# Patient Record
Sex: Female | Born: 1945 | Race: White | Hispanic: Refuse to answer | Marital: Married | Smoking: Former smoker
Health system: Southern US, Community
[De-identification: ages and names within clinical notes are randomized; demographics above are authoritative.]

## PROBLEM LIST (undated history)

## (undated) DIAGNOSIS — N390 Urinary tract infection, site not specified: Secondary | ICD-10-CM

## (undated) DIAGNOSIS — E785 Hyperlipidemia, unspecified: Secondary | ICD-10-CM

## (undated) DIAGNOSIS — J189 Pneumonia, unspecified organism: Secondary | ICD-10-CM

## (undated) DIAGNOSIS — I10 Essential (primary) hypertension: Secondary | ICD-10-CM

## (undated) DIAGNOSIS — G43109 Migraine with aura, not intractable, without status migrainosus: Secondary | ICD-10-CM

## (undated) DIAGNOSIS — D689 Coagulation defect, unspecified: Secondary | ICD-10-CM

## (undated) DIAGNOSIS — I82409 Acute embolism and thrombosis of unspecified deep veins of unspecified lower extremity: Secondary | ICD-10-CM

## (undated) DIAGNOSIS — K219 Gastro-esophageal reflux disease without esophagitis: Secondary | ICD-10-CM

## (undated) HISTORY — DX: Pneumonia, unspecified organism: J18.9

## (undated) HISTORY — DX: Hyperlipidemia, unspecified: E78.5

## (undated) HISTORY — DX: Acute embolism and thrombosis of unspecified deep veins of unspecified lower extremity: I82.409

## (undated) HISTORY — DX: Essential (primary) hypertension: I10

## (undated) HISTORY — PX: WISDOM TOOTH EXTRACTION: SHX21

## (undated) HISTORY — DX: Migraine with aura, not intractable, without status migrainosus: G43.109

## (undated) HISTORY — PX: TONSILLECTOMY: SUR1361

## (undated) HISTORY — DX: Coagulation defect, unspecified: D68.9

## (undated) HISTORY — PX: OTHER SURGICAL HISTORY: SHX169

## (undated) HISTORY — DX: Gastro-esophageal reflux disease without esophagitis: K21.9

## (undated) HISTORY — PX: TUBAL LIGATION: SHX77

## (undated) HISTORY — DX: Urinary tract infection, site not specified: N39.0

---

## 1998-09-21 ENCOUNTER — Other Ambulatory Visit: Admission: RE | Admit: 1998-09-21 | Discharge: 1998-09-21 | Payer: Self-pay | Admitting: Obstetrics and Gynecology

## 1998-10-26 ENCOUNTER — Ambulatory Visit (HOSPITAL_COMMUNITY): Admission: RE | Admit: 1998-10-26 | Discharge: 1998-10-26 | Payer: Self-pay | Admitting: Obstetrics and Gynecology

## 1999-03-20 ENCOUNTER — Other Ambulatory Visit: Admission: RE | Admit: 1999-03-20 | Discharge: 1999-03-20 | Payer: Self-pay | Admitting: Obstetrics and Gynecology

## 1999-11-02 ENCOUNTER — Ambulatory Visit (HOSPITAL_COMMUNITY): Admission: RE | Admit: 1999-11-02 | Discharge: 1999-11-02 | Payer: Self-pay | Admitting: Interventional Cardiology

## 1999-12-26 ENCOUNTER — Ambulatory Visit (HOSPITAL_COMMUNITY): Admission: RE | Admit: 1999-12-26 | Discharge: 1999-12-26 | Payer: Self-pay | Admitting: Obstetrics and Gynecology

## 1999-12-26 ENCOUNTER — Encounter: Payer: Self-pay | Admitting: Obstetrics and Gynecology

## 2000-03-19 ENCOUNTER — Other Ambulatory Visit: Admission: RE | Admit: 2000-03-19 | Discharge: 2000-03-19 | Payer: Self-pay | Admitting: Obstetrics and Gynecology

## 2000-12-29 ENCOUNTER — Ambulatory Visit (HOSPITAL_COMMUNITY): Admission: RE | Admit: 2000-12-29 | Discharge: 2000-12-29 | Payer: Self-pay | Admitting: Obstetrics and Gynecology

## 2000-12-29 ENCOUNTER — Encounter: Payer: Self-pay | Admitting: Obstetrics and Gynecology

## 2001-02-23 ENCOUNTER — Other Ambulatory Visit: Admission: RE | Admit: 2001-02-23 | Discharge: 2001-02-23 | Payer: Self-pay | Admitting: Obstetrics and Gynecology

## 2001-02-26 ENCOUNTER — Encounter: Payer: Self-pay | Admitting: Obstetrics and Gynecology

## 2001-02-26 ENCOUNTER — Encounter: Admission: RE | Admit: 2001-02-26 | Discharge: 2001-02-26 | Payer: Self-pay | Admitting: Obstetrics and Gynecology

## 2001-10-14 HISTORY — PX: ENDOVENOUS ABLATION SAPHENOUS VEIN W/ LASER: SUR449

## 2002-04-30 ENCOUNTER — Encounter: Payer: Self-pay | Admitting: Obstetrics and Gynecology

## 2002-04-30 ENCOUNTER — Ambulatory Visit (HOSPITAL_COMMUNITY): Admission: RE | Admit: 2002-04-30 | Discharge: 2002-04-30 | Payer: Self-pay | Admitting: Obstetrics and Gynecology

## 2004-07-03 ENCOUNTER — Ambulatory Visit (HOSPITAL_COMMUNITY): Admission: RE | Admit: 2004-07-03 | Discharge: 2004-07-03 | Payer: Self-pay | Admitting: Ophthalmology

## 2004-09-12 ENCOUNTER — Other Ambulatory Visit: Admission: RE | Admit: 2004-09-12 | Discharge: 2004-09-12 | Payer: Self-pay | Admitting: Obstetrics and Gynecology

## 2004-09-17 ENCOUNTER — Ambulatory Visit (HOSPITAL_COMMUNITY): Admission: RE | Admit: 2004-09-17 | Discharge: 2004-09-17 | Payer: Self-pay | Admitting: Obstetrics and Gynecology

## 2004-10-19 ENCOUNTER — Ambulatory Visit (HOSPITAL_COMMUNITY): Admission: RE | Admit: 2004-10-19 | Discharge: 2004-10-19 | Payer: Self-pay | Admitting: Obstetrics and Gynecology

## 2004-11-22 ENCOUNTER — Encounter (INDEPENDENT_AMBULATORY_CARE_PROVIDER_SITE_OTHER): Payer: Self-pay | Admitting: *Deleted

## 2004-11-22 ENCOUNTER — Ambulatory Visit (HOSPITAL_COMMUNITY): Admission: RE | Admit: 2004-11-22 | Discharge: 2004-11-22 | Payer: Self-pay | Admitting: Obstetrics and Gynecology

## 2005-09-13 ENCOUNTER — Ambulatory Visit: Payer: Self-pay | Admitting: Internal Medicine

## 2006-12-29 ENCOUNTER — Emergency Department (HOSPITAL_COMMUNITY): Admission: EM | Admit: 2006-12-29 | Discharge: 2006-12-29 | Payer: Self-pay | Admitting: Family Medicine

## 2007-08-08 ENCOUNTER — Emergency Department (HOSPITAL_COMMUNITY): Admission: EM | Admit: 2007-08-08 | Discharge: 2007-08-08 | Payer: Self-pay | Admitting: Family Medicine

## 2007-09-21 ENCOUNTER — Emergency Department (HOSPITAL_COMMUNITY): Admission: EM | Admit: 2007-09-21 | Discharge: 2007-09-21 | Payer: Self-pay | Admitting: Family Medicine

## 2007-10-20 ENCOUNTER — Encounter: Payer: Self-pay | Admitting: Internal Medicine

## 2008-04-13 ENCOUNTER — Emergency Department (HOSPITAL_COMMUNITY): Admission: EM | Admit: 2008-04-13 | Discharge: 2008-04-13 | Payer: Self-pay | Admitting: Family Medicine

## 2008-04-27 ENCOUNTER — Emergency Department (HOSPITAL_COMMUNITY): Admission: EM | Admit: 2008-04-27 | Discharge: 2008-04-27 | Payer: Self-pay | Admitting: Emergency Medicine

## 2008-06-29 ENCOUNTER — Ambulatory Visit: Payer: Self-pay | Admitting: Family Medicine

## 2008-06-29 ENCOUNTER — Encounter (INDEPENDENT_AMBULATORY_CARE_PROVIDER_SITE_OTHER): Payer: Self-pay | Admitting: Emergency Medicine

## 2008-06-29 ENCOUNTER — Ambulatory Visit: Payer: Self-pay | Admitting: Vascular Surgery

## 2008-06-29 ENCOUNTER — Telehealth: Payer: Self-pay | Admitting: Internal Medicine

## 2008-06-29 ENCOUNTER — Emergency Department (HOSPITAL_COMMUNITY): Admission: EM | Admit: 2008-06-29 | Discharge: 2008-06-29 | Payer: Self-pay | Admitting: Emergency Medicine

## 2008-06-29 DIAGNOSIS — M79609 Pain in unspecified limb: Secondary | ICD-10-CM

## 2008-07-06 ENCOUNTER — Encounter: Payer: Self-pay | Admitting: Internal Medicine

## 2008-07-08 ENCOUNTER — Encounter (INDEPENDENT_AMBULATORY_CARE_PROVIDER_SITE_OTHER): Payer: Self-pay | Admitting: *Deleted

## 2008-07-19 ENCOUNTER — Ambulatory Visit: Payer: Self-pay | Admitting: Internal Medicine

## 2008-07-19 DIAGNOSIS — I10 Essential (primary) hypertension: Secondary | ICD-10-CM

## 2008-07-19 DIAGNOSIS — E785 Hyperlipidemia, unspecified: Secondary | ICD-10-CM

## 2008-07-19 DIAGNOSIS — Z86718 Personal history of other venous thrombosis and embolism: Secondary | ICD-10-CM

## 2008-07-21 ENCOUNTER — Ambulatory Visit: Payer: Self-pay | Admitting: Internal Medicine

## 2008-07-25 ENCOUNTER — Encounter (INDEPENDENT_AMBULATORY_CARE_PROVIDER_SITE_OTHER): Payer: Self-pay | Admitting: *Deleted

## 2008-07-26 ENCOUNTER — Encounter: Payer: Self-pay | Admitting: Internal Medicine

## 2008-07-28 ENCOUNTER — Ambulatory Visit: Payer: Self-pay | Admitting: Internal Medicine

## 2008-08-18 ENCOUNTER — Ambulatory Visit: Payer: Self-pay | Admitting: Internal Medicine

## 2008-08-18 ENCOUNTER — Telehealth (INDEPENDENT_AMBULATORY_CARE_PROVIDER_SITE_OTHER): Payer: Self-pay | Admitting: *Deleted

## 2008-08-18 DIAGNOSIS — F411 Generalized anxiety disorder: Secondary | ICD-10-CM | POA: Insufficient documentation

## 2008-11-17 ENCOUNTER — Ambulatory Visit: Payer: Self-pay | Admitting: Internal Medicine

## 2008-11-17 LAB — CONVERTED CEMR LAB
ALT: 27 units/L (ref 0–35)
AST: 23 units/L (ref 0–37)
Albumin: 4 g/dL (ref 3.5–5.2)
Alkaline Phosphatase: 48 units/L (ref 39–117)
HDL: 55.9 mg/dL (ref >39.0)
Total CHOL/HDL Ratio: 3.5
Triglycerides: 71 mg/dL (ref 0–149)

## 2008-11-24 ENCOUNTER — Ambulatory Visit: Payer: Self-pay | Admitting: Internal Medicine

## 2009-07-15 ENCOUNTER — Emergency Department (HOSPITAL_COMMUNITY): Admission: EM | Admit: 2009-07-15 | Discharge: 2009-07-15 | Payer: Self-pay | Admitting: Family Medicine

## 2009-12-09 ENCOUNTER — Ambulatory Visit (HOSPITAL_COMMUNITY): Admission: RE | Admit: 2009-12-09 | Discharge: 2009-12-09 | Payer: Self-pay | Admitting: Surgery

## 2009-12-09 ENCOUNTER — Ambulatory Visit: Payer: Self-pay | Admitting: Surgery

## 2010-01-04 ENCOUNTER — Telehealth (INDEPENDENT_AMBULATORY_CARE_PROVIDER_SITE_OTHER): Payer: Self-pay | Admitting: *Deleted

## 2010-01-11 ENCOUNTER — Ambulatory Visit: Payer: Self-pay | Admitting: Internal Medicine

## 2010-01-11 LAB — CONVERTED CEMR LAB
Alkaline Phosphatase: 46 units/L (ref 39–117)
BUN: 21 mg/dL (ref 6–23)
Bilirubin, Direct: 0 mg/dL (ref 0.0–0.3)
CO2: 30 meq/L (ref 19–32)
Cholesterol: 199 mg/dL (ref 0–200)
GFR calc non Af Amer: 67.27 mL/min (ref >60)
Glucose, Bld: 87 mg/dL (ref 70–99)
LDL Cholesterol: 117 mg/dL — ABNORMAL HIGH (ref 0–99)
Potassium: 4 meq/L (ref 3.5–5.1)
Sodium: 142 meq/L (ref 135–145)
Total Bilirubin: 0.6 mg/dL (ref 0.3–1.2)
Total Protein: 6.8 g/dL (ref 6.0–8.3)
VLDL: 20.8 mg/dL (ref 0.0–40.0)

## 2010-01-16 ENCOUNTER — Ambulatory Visit: Payer: Self-pay | Admitting: Internal Medicine

## 2010-01-16 DIAGNOSIS — N959 Unspecified menopausal and perimenopausal disorder: Secondary | ICD-10-CM | POA: Insufficient documentation

## 2010-01-16 LAB — CONVERTED CEMR LAB: HDL goal, serum: 40 mg/dL

## 2010-01-22 ENCOUNTER — Encounter: Payer: Self-pay | Admitting: Internal Medicine

## 2010-09-13 ENCOUNTER — Encounter: Payer: Self-pay | Admitting: Internal Medicine

## 2010-11-11 LAB — CONVERTED CEMR LAB
Basophils Absolute: 0.1 10*3/uL (ref 0.0–0.1)
Basophils Relative: 1 % (ref 0.0–3.0)
Bilirubin, Direct: 0.1 mg/dL (ref 0.0–0.3)
Calcium: 9.5 mg/dL (ref 8.4–10.5)
Creatinine, Ser: 0.9 mg/dL (ref 0.4–1.2)
Eosinophils Absolute: 0.2 10*3/uL (ref 0.0–0.7)
Free T4: 1.2 ng/dL (ref 0.6–1.6)
GFR calc Af Amer: 82 mL/min
GFR calc non Af Amer: 68 mL/min
HCT: 43.1 % (ref 36.0–46.0)
Hemoglobin: 15.2 g/dL — ABNORMAL HIGH (ref 12.0–15.0)
Hgb A1c MFr Bld: 5.4 % (ref 4.6–6.0)
Lymphocytes Relative: 26.5 % (ref 12.0–46.0)
MCHC: 35.2 g/dL (ref 30.0–36.0)
MCV: 89.9 fL (ref 78.0–100.0)
Monocytes Absolute: 0.4 10*3/uL (ref 0.1–1.0)
Neutro Abs: 3.5 10*3/uL (ref 1.4–7.7)
RDW: 11.7 % (ref 11.5–14.6)
Sodium: 141 meq/L (ref 135–145)
TSH: 2.23 microintl units/mL (ref 0.35–5.50)
Total Bilirubin: 1.1 mg/dL (ref 0.3–1.2)

## 2010-11-15 NOTE — Letter (Signed)
Summary: Consuela Mimes MD  Consuela Mimes MD   Imported By: Lanelle Bal 10/16/2010 12:41:50  _____________________________________________________________________  External Attachment:    Type:   Image     Comment:   External Document

## 2010-11-15 NOTE — Letter (Signed)
Summary: Primary Care Appointment Letter  [facility] [place]  [address] [place], [place] [address]   Phone: [phone]  Fax: [phone]    07/08/2008 MRN: [id number]  [facility] [address] [place], [place]  [address]  Dear Ms. [facility],   Your Primary Care Physician  has indicated that:    _______it is time to schedule an appointment.    _______you missed your appointment on______ and need to call and          reschedule.    _______you need to have lab work done.    _______you need to schedule an appointment discuss lab or test results.    _______we rescheduled your appt with Dr. Alwyn Ren from 07/11/08 @ 12:00 to Oct. 6, 2009 @ 11:15am    Please call our office if you have any questions at [phone].     Thank you,    [place] [facility] Scheduler

## 2010-11-15 NOTE — Progress Notes (Signed)
Summary: Refill Request  Phone Note Refill Request Message from:  [facility] [address] Fax #: [phone]  Refills Requested: Medication #1:  LORAZEPAM 0.5 MG TABS 1 q 8 hrs as needed stress.   Dosage confirmed as above?Dosage Confirmed   Supply Requested: 1 month   Last Refilled: 08/18/2008 Next Appointment Scheduled: none Initial call taken by: Harold Barban,  January 04, 2010 8:59 AM    New/Updated Medications: LORAZEPAM 0.5 MG TABS (LORAZEPAM) 1 q 8 hrs as needed stress, DUE FOR APPOINTMENT Prescriptions: LORAZEPAM 0.5 MG TABS (LORAZEPAM) 1 q 8 hrs as needed stress, DUE FOR APPOINTMENT  #30 x 0   Entered by:   Shonna Chock   Authorized by:   Marga Melnick MD   Signed by:   Shonna Chock on 01/04/2010   Method used:   Printed then faxed to ...       [facility]  [phone]* (retail)       [address] [place], [place]  [address]       Ph: [phone] or [phone]       Fax: [phone]   RxID:   [id number]

## 2010-11-15 NOTE — Assessment & Plan Note (Signed)
Summary: fu on labs/kdc   Vital Signs:  Patient profile:   65 year old female Weight:      187.2 pounds BMI:     33.55 Pulse rate:   72 / minute Resp:     12 per minute BP sitting:   124 / 84  (left arm) Cuff size:   large  Vitals Entered By: Shonna Chock (January 16, 2010 3:01 PM) CC: Follow-up visit: Discuss Labs (copy given), Lipid Management Comments REVIEWED MED LIST, PATIENT AGREED DOSE AND INSTRUCTION CORRECT    CC:  Follow-up visit: Discuss Labs (copy given) and Lipid Management.  History of Present Illness: Labs reviewed & risks discussed. LDL is @ goal of < 130.On Heart Healthy diet; CVE as walking 2-3 mpd 5X/week w/o symptoms.  Lipid Management History:      Positive NCEP/ATP III risk factors include female age 64 years old or older and hypertension.  Negative NCEP/ATP III risk factors include no history of early menopause without estrogen hormone replacement, non-diabetic, no family history for ischemic heart disease, non-tobacco-user status, no ASHD (atherosclerotic heart disease), no prior stroke/TIA, no peripheral vascular disease, and no history of aortic aneurysm.     Allergies: 1)  ! Darvocet  Past History:  Past Medical History: DVT, hx of 1998 Hyperlipidemia: NMR : LDL 409(8119/147), TG 108, HDL 60. LDL goal = < 130, ideally < 110. Hypertension Ocular Migraines  Review of Systems CV:  Denies chest pain or discomfort, leg cramps with exertion, palpitations, shortness of breath with exertion, swelling of feet, and swelling of hands.  Physical Exam  General:  well-nourished; alert,appropriate and cooperative throughout examination Lungs:  Normal respiratory effort, chest expands symmetrically. Lungs are clear to auscultation, no crackles or wheezes. Heart:  Normal rate and regular rhythm. S1 and S2 normal without gallop, murmur, click, rub .S4 Pulses:  R and L carotid,radial,dorsalis pedis and posterior tibial pulses are full and equal bilaterally Psych:   Oriented X3.  Focused & intelligent   Impression & Recommendations:  Problem # 1:  HYPERLIPIDEMIA (ICD-272.4) LDL  @ goal  Her updated medication list for this problem includes:    Crestor 20 Mg Tabs (Rosuvastatin calcium) .Marland Kitchen... 1 by mouth x 1 weekly  Problem # 2:  HYPERTENSION (ICD-401.9) Controlled Her updated medication list for this problem includes:    Bystolic 20 Mg Tabs (Nebivolol hcl) .Marland Kitchen... 1 by mouth once daily    Benicar Hct 40-25 Mg Tabs (Olmesartan medoxomil-hctz) .Marland Kitchen... 1 by mouth once daily  Complete Medication List: 1)  Bystolic 20 Mg Tabs (Nebivolol hcl) .Marland Kitchen.. 1 by mouth once daily 2)  Benicar Hct 40-25 Mg Tabs (Olmesartan medoxomil-hctz) .Marland Kitchen.. 1 by mouth once daily 3)  Vitamin D 2000 Unit Tabs (Cholecalciferol) .Marland Kitchen.. 1 by mouth once daily 4)  Centrum Silver Tabs (Multiple vitamins-minerals) .... Take 1 tab once daily 5)  Chromium Gtf 200 Mcg Tabs (Chromium) .... Take 1 tab once daily 6)  Fish Oil 1200 Mg Caps (Omega-3 fatty acids) .Marland Kitchen.. 1 by mouth once daily 7)  Bayer Aspirin 325 Mg Tabs (Aspirin) .Marland Kitchen.. 1 by mouth once daily 8)  Crestor 20 Mg Tabs (Rosuvastatin calcium) .Marland Kitchen.. 1 by mouth x 1 weekly 9)  Lorazepam 0.5 Mg Tabs (Lorazepam) .Marland Kitchen.. 1 q 8 hrs as needed stress, due for appointment 10)  Cyto-q Max 100 Mg/ml Liqd (Ubiquinol liposomal) .... Once daily 11)  Astaxantin  .... Once daily 12)  L-arginine 500 Mg Tabs (Arginine) .... Once daily  Other Orders: Radiology Referral (  Radiology)  Lipid Assessment/Plan:      Based on NCEP/ATP III, the patient's risk factor category is "2 or more risk factors and a calculated 10 year CAD risk of < 20%".  The patient's lipid goals are as follows: Total cholesterol goal is 200; LDL cholesterol goal is 130; HDL cholesterol goal is 40; Triglyceride goal is 150.  Her LDL cholesterol goal has been met.    Patient Instructions: 1)  Please schedule a follow-up appointment in 1 year or  as needed

## 2011-01-17 LAB — POCT URINALYSIS DIP (DEVICE)
Bilirubin Urine: NEGATIVE
Glucose, UA: NEGATIVE mg/dL
Hgb urine dipstick: NEGATIVE
Ketones, ur: NEGATIVE mg/dL
Specific Gravity, Urine: 1.015 (ref 1.005–1.030)

## 2011-03-01 NOTE — Op Note (Signed)
Deborah [name], Deborah [name]        ACCOUNT NO.:  [id number]   MEDICAL RECORD NO.:  [id number]          PATIENT TYPE:  AMB   LOCATION:  SDC                           [facility]ILITY:  [facility]   PHYSICIAN:  Hal Morales, M.D.DATE OF BIRTH:  1946/07/27   DATE OF PROCEDURE:  11/22/2004  DATE OF DISCHARGE:                                 OPERATIVE REPORT   PREOPERATIVE DIAGNOSIS:  Postmenopausal bleeding, question of intrauterine  synechiae on sonohysterogram.   POSTOPERATIVE DIAGNOSIS:  Postmenopausal bleeding, question of intrauterine  synechiae on sonohysterogram.   OPERATION:  Hysteroscopy and curettage.   SURGEON:  Hal Morales, M.D.   ANESTHESIA:  General LMA.   ESTIMATED BLOOD LOSS:  Less than 10 cc.   COMPLICATIONS:  None.   SPECIMENS TO PATHOLOGY:  Endocervical curettings and endometrial curettings.   PROCEDURE:  The patient was taken to the operating room after appropriate  identification and placed on the operating table. After the attainment of  adequate general anesthesia, she was placed in lithotomy position. The  perineum and vagina were prepped with multiple layers of Betadine and a red  Robinson catheter used to empty the bladder. The perineum was draped as a  sterile field. A Graves speculum was placed in the vagina and a single-tooth  tenaculum placed on the anterior cervix. The uterus sounded to 7 cm. The  cervix was then dilated to accommodate the diagnostic hysteroscope and the  hysteroscope was used to visualize the endometrial cavity, including both  ostia as well as the endocervical canal. No specific lesions were noted.  There was a small indentation at the fundus of the uterus, but no specific  synechiae that precluded expansion of the cavity. The hysteroscope was  removed and curettage of all quadrants of the uterus was taken out after an  endocervical specimen had been obtained. The hysteroscope was replaced with  documentation of an intact uterus and  no specific lesions. All instruments  were then removed from the vagina and the patient was awakened from general  anesthesia and taken to the recovery room in satisfactory condition having  tolerated the procedure well with sponge and instrument counts correct.      VPH/MEDQ  D:  11/22/2004  T:  11/22/2004  Job:  [id number]

## 2011-03-01 NOTE — H&P (Signed)
NAME:  Deborah [name], Deborah [name]        ACCOUNT NO.:  [id number]   MEDICAL RECORD NO.:  [id number]          PATIENT TYPE:  AMB   LOCATION:  SDC                           [facility]   PHYSICIAN:  Hal Morales, M.D.DATE OF BIRTH:  December 31, 1945   DATE OF ADMISSION:  DATE OF DISCHARGE:                                HISTORY & PHYSICAL   HISTORY OF PRESENT ILLNESS:  Ms. Olsson is a 65 year old married white  menopausal female, para 3-0-0-3 who presents for hysteroscopic resection of  synechiae.  In November 2005, the patient reported a 1 1/2 week of vaginal  spotting which was occasionally observed to be dark brown and mucoid.  There  was no associated pain with this episode and the patient has not had any  bleeding since that time.  She denies any vaginitis symptoms, fever or  urinary tract symptoms.  A pelvic ultrasound with a sonohysterogram in  January 2006 showed four small fibroids with the largest measuring 1.9 cm, a  thin linear structure in the left fundal region of the endometrial cavity  suspicious for synechia, however, no evidence of other endometrial pathology  or submucosal fibroids were seen.  A discussion was held with the patient  regarding proceeding with hysteroscopic resection of her synechia as well as  this being a means to further evaluate her endometrium. The patient has  consented to proceed.   OB HISTORY:  Gravida 3, para 3-0-0-3.   GYN HISTORY:  Menarche 65 years old.  The patient is post menopausal. She  uses tubal ligation as a method of contraception, denies any history of  sexually transmitted diseases. 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   PAST MEDICAL HISTORY:  Positive for hypertension, deep vein thrombosis  (secondary to oral contraceptives), endocervical polyps, right breast cyst  (benign), borderline diabetes, mild mitral regurgitation, and ocular  migraine.   PAST SURGICAL HISTORY:  1959 tonsillectomy, 1979 bilateral tubal ligation,  2001 bilateral bunionectomy.  Denies any history of blood transfusions or  problems with anesthesia.   FAMILY HISTORY:  Positive for diabetes mellitus, stroke, hypertension and  renal cancer.   SOCIAL HISTORY:  The patient is married. 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   HABITS:  She does not use tobacco, she drinks alcohol socially.   CURRENT MEDICATIONS:  1.  Toprol XL 100 mg daily.  2.  Vytoran 10/40, 1 tablet daily.  3.  Calcium 500 mg twice daily.  4.  Fish oil 1 tablet daily.  5.  Vitamin E, 400 IU units, 1 tablet daily.  6.  Multivitamin 1 tablet daily.   ALLERGIES:  The patient has no known drug allergies; however, she is  sensitive Darvocet which causes severe nausea and vomiting.   REVIEW OF SYMPTOMS:  Negative except as mentioned in history of present  illness.   PHYSICAL EXAMINATION:  VITAL SIGNS:  Blood pressure is 130/80, weight is  176, height is 5 feet 3 1/2 inches tall.  NECK:  Supple, there are  no masses, thyromegaly or cervical adenopathy.  HEART:  Regular rate and rhythm. There is no murmur.  LUNGS:  Clear to auscultation. There are no wheezes, rales or rhonchi.  BACK:  No CVA tenderness.  ABDOMEN:  Bowel sounds are present.  It is soft without tenderness,  guarding, rebound or organomegaly.  EXTREMITIES:  Without cyanosis, clubbing or edema.  PELVIC:  EGBUS is within normal limits. Vagina is mildly atrophic.  Cervix  is nontender without lesions.  Uterus appears normal size, shape and  consistency without tenderness.  Adnexa without tenderness or masses.  Rectovaginal without tenderness or masses.   IMPRESSION:  1.  Postmenopausal bleeding.  2.  Endometrial synechiae.   DISPOSITION:  A discussion was held with the patient regarding the  indications for her procedure along with its risks which include but are not  limited to  reaction to anesthesia, damage to adjacent organs, infection and  excessive bleeding. The patient has consented to proceed with hysteroscopic  resection of synechia at [facility] [place] on November 22, 2004  at 9 o'clock a.m.      EJP/MEDQ  D:  11/16/2004  T:  11/16/2004  Job:  [id number]

## 2011-03-30 ENCOUNTER — Encounter: Payer: Self-pay | Admitting: Internal Medicine

## 2011-04-02 ENCOUNTER — Ambulatory Visit (INDEPENDENT_AMBULATORY_CARE_PROVIDER_SITE_OTHER): Payer: 59 | Admitting: Internal Medicine

## 2011-04-02 ENCOUNTER — Encounter: Payer: Self-pay | Admitting: Internal Medicine

## 2011-04-02 DIAGNOSIS — I10 Essential (primary) hypertension: Secondary | ICD-10-CM

## 2011-04-02 DIAGNOSIS — Z0000 Encounter for general adult medical examination without abnormal findings: Secondary | ICD-10-CM

## 2011-04-02 DIAGNOSIS — Z23 Encounter for immunization: Secondary | ICD-10-CM

## 2011-04-02 DIAGNOSIS — E785 Hyperlipidemia, unspecified: Secondary | ICD-10-CM

## 2011-04-02 DIAGNOSIS — R635 Abnormal weight gain: Secondary | ICD-10-CM

## 2011-04-02 DIAGNOSIS — Z86718 Personal history of other venous thrombosis and embolism: Secondary | ICD-10-CM

## 2011-04-02 NOTE — Progress Notes (Signed)
Subjective:    Patient ID: Deborah [name], female    DOB: 28-May-1946, 65 y.o.   MRN: [id number]  HPI  Deborah [name] is here for a physical;acute issues include weight concerns.     Review of Systems Patient reports no significant  vision/ hearing  changes, adenopathy,fever,   persistant / recurrent hoarseness , swallowing issues, chest pain,palpitations,edema,persistant /recurrent cough, hemoptysis, dyspnea( rest/ exertional/paroxysmal nocturnal), gastrointestinal bleeding(melena, rectal bleeding), abdominal pain,  bowel changes,GU symptoms(dysuria, hematuria,pyuria, incontinence ), Gyn symptoms(abnormal  bleeding , pain),  syncope, focal weakness, memory loss,numbness & tingling, skin/hair /nail changes,abnormal bruising or bleeding, anxiety,or depression.  No colonoscopy to date; "I didn't get it done" [facility] reviewed). Exercising as walking 2-3 mpd. Weight up 15# with carbs. GERD associated cough; wine & spicey foods triggers  for GERD.     Objective:   Physical Exam Gen.: Healthy and well-nourished in appearance. Alert, appropriate and cooperative throughout exam. Head: Normocephalic without obvious abnormalities Eyes: No corneal or conjunctival inflammation noted. Pupils equal round reactive to light and accommodation. Fundal exam is benign without hemorrhages, exudate, papilledema. Extraocular motion intact. Vision grossly normal.Mininmal OS ptosis Ears: External  ear exam reveals no significant lesions or deformities. Canals clear .TMs normal. Hearing is grossly normal bilaterally. Nose: External nasal exam reveals no deformity or inflammation. Nasal mucosa are pink and moist. No lesions or exudates noted. Septum L dislocated  Mouth: Oral mucosa and oropharynx reveal no lesions or exudates. Teeth in good repair.Slight TMJ dislocation to L Neck: No deformities, masses, or tenderness noted. Range of motion &. Thyroid normal. Lungs: Normal respiratory effort; chest expands  symmetrically. Lungs are clear to auscultation without rales, wheezes, or increased work of breathing. Heart: Slow rate & regular rhythm. Normal S1 and S2. No gallop, click, or rub. No  murmur. Abdomen: Bowel sounds normal; abdomen soft and nontender. No masses, organomegaly or hernias noted. Genitalia: as per Dr  Pennie Rushing                                                                                     Musculoskeletal/extremities: No deformity or scoliosis noted of  the thoracic or lumbar spine. No clubbing, cyanosis, edema, or deformity noted. Range of motion  normal .Tone & strength  normal.Joints normal. Nail health  Good.Crepitus of knees Vascular: Carotid, radial artery, dorsalis pedis and dorsalis posterior tibial pulses are full and equal. No bruits present. Neurologic: Alert and oriented x3. Deep tendon reflexes symmetrical and normal.         Skin: Intact without suspicious lesions or rashes. Lymph: No cervical, axillary, or inguinal lymphadenopathy present. Psych: Mood and affect are normal. Normally interactive                                                                                         Assessment &  Plan:  #1 comprehensive physical exam; no acute findings #2 see Problem List with Assessments & Recommendations Plan: see Orders

## 2011-04-02 NOTE — Progress Notes (Signed)
Addended by: Doristine Devoid on: 04/02/2011 02:18 PM   Modules accepted: Orders

## 2011-04-02 NOTE — Patient Instructions (Addendum)
Preventive Health Care: Exercise  30-45  minutes a day, 3-4 days a week. Walking is especially valuable in preventing Osteoporosis. Eat a low-fat diet with lots of fruits and vegetables, up to 7-9 servings per day. Avoid obesity; your goal = waist less than 35 inches.Consume less than 30 grams of sugar per day from foods & drinks with High Fructose Corn Syrup as #2,3 or #4 on label. The triggers for dyspepsia or "heart burn"  include stress; the "aspirin family" ; alcohol; peppermint; and caffeine (coffee, tea, cola, and chocolate). The aspirin family would include aspirin and the nonsteroidal agents such as ibuprofen &  Naproxen. Tylenol would not cause reflux. If having dyspepsia ; food & dink should be avoided for @ least 2 hors before going to bed.  BMD every 25 months Please  schedule fasting Labs : BMET,Lipids, hepatic panel, CBC & dif,free T4,  & TSH.  A Coagulopathy  Profile is indicated clinically before any future  prolonged travel or surgery.

## 2011-04-03 ENCOUNTER — Other Ambulatory Visit: Payer: Self-pay | Admitting: Internal Medicine

## 2011-04-03 DIAGNOSIS — I10 Essential (primary) hypertension: Secondary | ICD-10-CM

## 2011-04-03 DIAGNOSIS — Z86718 Personal history of other venous thrombosis and embolism: Secondary | ICD-10-CM

## 2011-04-03 DIAGNOSIS — Z0000 Encounter for general adult medical examination without abnormal findings: Secondary | ICD-10-CM

## 2011-04-03 DIAGNOSIS — R635 Abnormal weight gain: Secondary | ICD-10-CM

## 2011-04-03 DIAGNOSIS — E785 Hyperlipidemia, unspecified: Secondary | ICD-10-CM

## 2011-04-04 ENCOUNTER — Other Ambulatory Visit (INDEPENDENT_AMBULATORY_CARE_PROVIDER_SITE_OTHER): Payer: 59

## 2011-04-04 DIAGNOSIS — I10 Essential (primary) hypertension: Secondary | ICD-10-CM

## 2011-04-04 DIAGNOSIS — E785 Hyperlipidemia, unspecified: Secondary | ICD-10-CM

## 2011-04-04 DIAGNOSIS — Z86718 Personal history of other venous thrombosis and embolism: Secondary | ICD-10-CM

## 2011-04-04 DIAGNOSIS — R635 Abnormal weight gain: Secondary | ICD-10-CM

## 2011-04-04 DIAGNOSIS — Z0000 Encounter for general adult medical examination without abnormal findings: Secondary | ICD-10-CM

## 2011-04-04 LAB — CBC WITH DIFFERENTIAL/PLATELET
Basophils Relative: 0.5 % (ref 0.0–3.0)
Eosinophils Absolute: 0.2 10*3/uL (ref 0.0–0.7)
HCT: 43.6 % (ref 36.0–46.0)
Hemoglobin: 15.1 g/dL — ABNORMAL HIGH (ref 12.0–15.0)
Lymphocytes Relative: 27.8 % (ref 12.0–46.0)
Lymphs Abs: 2 10*3/uL (ref 0.7–4.0)
MCHC: 34.5 g/dL (ref 30.0–36.0)
Neutro Abs: 4.6 10*3/uL (ref 1.4–7.7)
RBC: 4.83 Mil/uL (ref 3.87–5.11)

## 2011-04-04 LAB — LDL CHOLESTEROL, DIRECT: Direct LDL: 133.9 mg/dL

## 2011-04-04 LAB — BASIC METABOLIC PANEL
BUN: 24 mg/dL — ABNORMAL HIGH (ref 6–23)
CO2: 29 mEq/L (ref 19–32)
Chloride: 103 mEq/L (ref 96–112)
GFR: 75.67 mL/min (ref >60.00)
Glucose, Bld: 111 mg/dL — ABNORMAL HIGH (ref 70–99)
Potassium: 4.3 mEq/L (ref 3.5–5.1)
Sodium: 140 mEq/L (ref 135–145)

## 2011-04-04 LAB — LIPID PANEL
HDL: 50 mg/dL (ref >39.00)
Total CHOL/HDL Ratio: 5
VLDL: 21 mg/dL (ref 0.0–40.0)

## 2011-04-04 LAB — HEPATIC FUNCTION PANEL
Alkaline Phosphatase: 56 U/L (ref 39–117)
Bilirubin, Direct: 0.1 mg/dL (ref 0.0–0.3)
Total Bilirubin: 0.6 mg/dL (ref 0.3–1.2)
Total Protein: 6.7 g/dL (ref 6.0–8.3)

## 2011-04-04 NOTE — Progress Notes (Signed)
Labs only

## 2011-04-08 LAB — HEMOGLOBIN A1C: Hgb A1c MFr Bld: 5.8 % (ref 4.6–6.5)

## 2011-07-11 LAB — POCT URINALYSIS DIP (DEVICE)
Bilirubin Urine: NEGATIVE
Glucose, UA: 100 — AB
Ketones, ur: NEGATIVE
pH: 5

## 2011-07-11 LAB — URINE CULTURE: Colony Count: 3000

## 2011-07-12 LAB — POCT URINALYSIS DIP (DEVICE)
Glucose, UA: 100 — AB
Nitrite: POSITIVE — AB
Protein, ur: NEGATIVE
Urobilinogen, UA: 1
pH: 5.5

## 2011-07-15 LAB — DIFFERENTIAL
Basophils Absolute: 0.1
Basophils Relative: 1
Eosinophils Absolute: 0.2
Neutro Abs: 4
Neutrophils Relative %: 62

## 2011-07-15 LAB — CBC
MCHC: 34.6
MCV: 88.4
Platelets: 246
RDW: 12.4

## 2011-07-22 LAB — CBC
Platelets: 255
RBC: 5.02
WBC: 11.3 — ABNORMAL HIGH

## 2011-07-22 LAB — DIFFERENTIAL
Basophils Relative: 0
Eosinophils Absolute: 0.1 — ABNORMAL LOW
Lymphs Abs: 0.6 — ABNORMAL LOW
Monocytes Relative: 6
Neutro Abs: 9.9 — ABNORMAL HIGH
Neutrophils Relative %: 87 — ABNORMAL HIGH

## 2011-07-22 LAB — POCT RAPID STREP A: Streptococcus, Group A Screen (Direct): NEGATIVE

## 2011-10-15 HISTORY — PX: ENDOVENOUS ABLATION SAPHENOUS VEIN W/ LASER: SUR449

## 2012-01-07 ENCOUNTER — Other Ambulatory Visit: Payer: Self-pay | Admitting: *Deleted

## 2012-01-07 ENCOUNTER — Ambulatory Visit (INDEPENDENT_AMBULATORY_CARE_PROVIDER_SITE_OTHER): Payer: 59 | Admitting: Physician Assistant

## 2012-01-07 VITALS — BP 150/86 | HR 71 | Temp 98.7°F | Resp 16 | Ht 62.25 in | Wt 204.2 lb

## 2012-01-07 DIAGNOSIS — N390 Urinary tract infection, site not specified: Secondary | ICD-10-CM

## 2012-01-07 LAB — POCT UA - MICROSCOPIC ONLY
Mucus, UA: NEGATIVE
Yeast, UA: NEGATIVE

## 2012-01-07 LAB — POCT URINALYSIS DIPSTICK
Bilirubin, UA: NEGATIVE
Glucose, UA: 100
Ketones, UA: NEGATIVE
Nitrite, UA: POSITIVE
pH, UA: 5

## 2012-01-07 MED ORDER — NITROFURANTOIN MONOHYD MACRO 100 MG PO CAPS: 100.0000 mg | ORAL_CAPSULE | Freq: Two times a day (BID) | ORAL | Status: AC

## 2012-01-07 MED ORDER — NITROFURANTOIN MONOHYD MACRO 100 MG PO CAPS: 100.0000 mg | ORAL_CAPSULE | Freq: Two times a day (BID) | ORAL | Status: DC

## 2012-01-07 NOTE — Progress Notes (Signed)
  Subjective:    Patient ID: Deborah [name], female    DOB: Apr 14, 1946, 66 y.o.   MRN: [id number]  HPI Deborah [name] comes in today with dysuria, urgency and frequency for 2 days.  H/O UTI in past.  Last was 3 yrs ago.  No fever, chills, nausea, vomiting, back pain. No hematuria.  Past Medical History  Diagnosis Date  . DVT (deep venous thrombosis)   . Ocular migraine   . Hyperlipidemia   . GERD (gastroesophageal reflux disease)   . Hypertension       Review of Systems As above    Objective:   Physical Exam  Constitutional: She is oriented to person, place, and time. She appears well-developed and well-nourished.  Cardiovascular: Normal rate and regular rhythm.   Pulmonary/Chest: Effort normal and breath sounds normal.  Abdominal: Soft. There is tenderness (suprapubic). There is no rebound.  Musculoskeletal:       No CVA tenderness   Neurological: She is alert and oriented to person, place, and time.     Results for orders placed in visit on 01/07/12  POCT UA - MICROSCOPIC ONLY      Component Value Range   WBC, Ur, HPF, POC 9-17     RBC, urine, microscopic 1-5     Bacteria, U Microscopic trace     Mucus, UA negative     Epithelial cells, urine per micros 0-1     Crystals, Ur, HPF, POC negative     Casts, Ur, LPF, POC negative     Yeast, UA negative    POCT URINALYSIS DIPSTICK      Component Value Range   Color, UA red     Clarity, UA slightly cloudy     Glucose, UA 100     Bilirubin, UA negative     Ketones, UA negative     Spec Grav, UA <=1.005     Blood, UA small     pH, UA 5.0     Protein, UA 30     Urobilinogen, UA 2.0     Nitrite, UA positive     Leukocytes, UA Trace          Assessment & Plan:  UTI  Macrobid 100.  Check urine culture.  Watch for Pyelo symptoms.  Push fluids.  Return if no improvement with in 48-72 hours.

## 2012-01-11 LAB — URINE CULTURE: Colony Count: 40000

## 2012-07-29 ENCOUNTER — Encounter: Payer: Self-pay | Admitting: Internal Medicine

## 2012-07-29 ENCOUNTER — Ambulatory Visit (INDEPENDENT_AMBULATORY_CARE_PROVIDER_SITE_OTHER): Payer: Medicare Other | Admitting: Internal Medicine

## 2012-07-29 VITALS — BP 138/90 | HR 65 | Temp 98.2°F | Resp 12 | Ht 62.5 in | Wt 189.0 lb

## 2012-07-29 DIAGNOSIS — Z23 Encounter for immunization: Secondary | ICD-10-CM

## 2012-07-29 DIAGNOSIS — Z0000 Encounter for general adult medical examination without abnormal findings: Secondary | ICD-10-CM

## 2012-07-29 DIAGNOSIS — J45909 Unspecified asthma, uncomplicated: Secondary | ICD-10-CM

## 2012-07-29 DIAGNOSIS — E785 Hyperlipidemia, unspecified: Secondary | ICD-10-CM

## 2012-07-29 MED ORDER — ALBUTEROL SULFATE HFA 108 (90 BASE) MCG/ACT IN AERS: 2.0000 | INHALATION_SPRAY | RESPIRATORY_TRACT | Status: DC | PRN

## 2012-07-29 NOTE — Patient Instructions (Addendum)
Preventive Health Care: Exercise  30-45  minutes a day, 3-4 days a week. Walking is especially valuable in preventing Osteoporosis. Eat a low-fat diet with lots of fruits and vegetables, up to 7-9 servings per day.  Consume less than 30 grams of sugar per day from foods & drinks with High Fructose Corn Syrup as # 1,2,3 or #4 on label. Please  schedule fasting Labs : BMET,Lipids, hepatic panel, CK, TSH, A1c. PLEASE BRING THESE INSTRUCTIONS TO FOLLOW UP  LAB APPOINTMENT.This will guarantee correct labs are drawn, eliminating need for repeat blood sampling ( needle sticks ! ). Diagnoses /Codes: 272.4,401.9,995.20,790.29.  If you activate My Chart; the results can be released to you as soon as they populate from the lab. If you choose not to use this program; the labs have to be reviewed, copied & mailed   causing a delay in getting the results to you.

## 2012-07-29 NOTE — Progress Notes (Signed)
Subjective:    Patient ID: Deborah [name], female    DOB: Feb 19, 1946, 66 y.o.   MRN: [id number]  HPI Medicare Wellness Visit:  The following psychosocial & medical history were reviewed as required by Medicare.   Social history: caffeine: chocolate occasionally , alcohol:  < 4/ week,  tobacco use : quit 1971  & exercise : walking 2-3 mpd   Home & personal  safety / fall risk: no issues, activities of daily living: no limitations , seatbelt use : yes , and smoke alarm employment : yes .  Power of Attorney/Living Will status : in place  Vision ( as recorded per Nurse) & Hearing  evaluation :  Ophth exam 2011; no hearing exam Orientation :oriented X 3 , memory & recall :good,  math testing: good,and mood & affect : normal . Depression / anxiety: denied Travel history : 2007 [place], immunization status : Shingles needed , transfusion history:  no, and preventive health surveillance ( colonoscopies, BMD , etc as per protocol/ [facility]): colonoscopy due, Dental care:  Every 6 mos . Chart reviewed &  Updated. Active issues reviewed & addressed.       Review of Systems She is on The Fast Metabolism Diet; her exercise program is noted above. She is compliant with her statin. She denies abdominal pain or myalgias. There is no family history of premature coronary disease or stroke.  For at least a year she's had paroxysmal cough with dust exposure or allergen exposure. Respiratory tract infections and also causes. She has no history of asthma. She describes sneezing but no other significant extrinsic symptoms. She has no active component of reflux. Albuterol prescribed following respiratory infection has been of benefit. She does not have frequent paroxysmal nocturnal dyspnea.     Objective:   Physical Exam Gen.:  well-nourished in appearance. Alert, appropriate and cooperative throughout exam. Head: Normocephalic without obvious abnormalities Eyes: No corneal or conjunctival inflammation noted.  Pupils equal round reactive to light and accommodation. Fundal exam is benign without hemorrhages, exudate, papilledema. Extraocular motion intact. Vision grossly normal with lenses. Ears: External  ear exam reveals no significant lesions or deformities. Canals clear .TMs normal. Hearing is grossly normal bilaterally. Nose: External nasal exam reveals no deformity or inflammation. Nasal mucosa are pink and moist. No lesions or exudates noted.   Mouth: Oral mucosa and oropharynx reveal no lesions or exudates. Teeth in good repair. Neck: No deformities, masses, or tenderness noted. Range of motion& Thyroid normal. Lungs: Normal respiratory effort; chest expands symmetrically. Lungs are clear to auscultation without rales, wheezes, or increased work of breathing. Heart: Normal rate and rhythm. Normal S1 and S2. No gallop, click, or rub. Grade 1/2 over 6 systolic murmur  Abdomen: Bowel sounds normal; abdomen soft and nontender. No masses, organomegaly or hernias noted. Genitalia: Dr Senaida Ores in 01/14 Musculoskeletal/extremities: No deformity or scoliosis noted of  the thoracic or lumbar spine. No clubbing, cyanosis, edema, or deformity noted. Range of motion  normal .Tone & strength  normal.Joints normal. Nail health  good. Vascular: Carotid, radial artery, dorsalis pedis and  posterior tibial pulses are full and equal. No bruits present. Neurologic: Alert and oriented x3. Deep tendon reflexes symmetrical and normal.          Skin: Intact without suspicious lesions or rashes. Lymph: No cervical, axillary lymphadenopathy present. Psych: Mood and affect are normal. Normally interactive  Assessment & Plan:  #1 Medicare Wellness Exam; criteria met ; data entered #2 RAD Plan: see Orders

## 2012-07-30 ENCOUNTER — Other Ambulatory Visit (INDEPENDENT_AMBULATORY_CARE_PROVIDER_SITE_OTHER): Payer: Medicare Other

## 2012-07-30 DIAGNOSIS — R7309 Other abnormal glucose: Secondary | ICD-10-CM

## 2012-07-30 DIAGNOSIS — E785 Hyperlipidemia, unspecified: Secondary | ICD-10-CM

## 2012-07-30 DIAGNOSIS — I10 Essential (primary) hypertension: Secondary | ICD-10-CM

## 2012-07-30 LAB — BASIC METABOLIC PANEL
Calcium: 9.5 mg/dL (ref 8.4–10.5)
Creatinine, Ser: 0.9 mg/dL (ref 0.4–1.2)
GFR: 63.47 mL/min (ref >60.00)
Sodium: 140 mEq/L (ref 135–145)

## 2012-07-30 LAB — HEPATIC FUNCTION PANEL
ALT: 26 U/L (ref 0–35)
AST: 19 U/L (ref 0–37)
Albumin: 3.9 g/dL (ref 3.5–5.2)
Total Protein: 7.1 g/dL (ref 6.0–8.3)

## 2012-07-30 LAB — CK: Total CK: 60 U/L (ref 7–177)

## 2012-07-30 LAB — LIPID PANEL
Cholesterol: 208 mg/dL — ABNORMAL HIGH (ref 0–200)
HDL: 52.5 mg/dL (ref >39.00)
Triglycerides: 107 mg/dL (ref 0.0–149.0)

## 2012-07-30 LAB — TSH: TSH: 2.15 u[IU]/mL (ref 0.35–5.50)

## 2012-08-01 ENCOUNTER — Encounter: Payer: Self-pay | Admitting: Internal Medicine

## 2012-08-03 ENCOUNTER — Other Ambulatory Visit: Payer: Self-pay

## 2012-08-03 MED ORDER — ROSUVASTATIN CALCIUM 20 MG PO TABS: 20.0000 mg | ORAL_TABLET | Freq: Every day | ORAL | Status: DC

## 2012-10-26 ENCOUNTER — Other Ambulatory Visit: Payer: Self-pay | Admitting: Obstetrics and Gynecology

## 2012-10-26 DIAGNOSIS — Z1231 Encounter for screening mammogram for malignant neoplasm of breast: Secondary | ICD-10-CM

## 2012-11-20 ENCOUNTER — Ambulatory Visit
Admission: RE | Admit: 2012-11-20 | Discharge: 2012-11-20 | Disposition: A | Discharge status: Home / Self Care | Payer: Medicare Other | Source: Ambulatory Visit | Attending: Obstetrics and Gynecology | Admitting: Obstetrics and Gynecology

## 2012-11-20 DIAGNOSIS — Z1231 Encounter for screening mammogram for malignant neoplasm of breast: Secondary | ICD-10-CM

## 2013-05-19 ENCOUNTER — Other Ambulatory Visit: Payer: Self-pay

## 2013-08-02 ENCOUNTER — Other Ambulatory Visit: Payer: Self-pay | Admitting: Interventional Cardiology

## 2013-08-10 ENCOUNTER — Ambulatory Visit (INDEPENDENT_AMBULATORY_CARE_PROVIDER_SITE_OTHER): Payer: Medicare Other | Admitting: General Practice

## 2013-08-10 DIAGNOSIS — Z23 Encounter for immunization: Secondary | ICD-10-CM

## 2013-08-13 ENCOUNTER — Other Ambulatory Visit: Payer: Self-pay | Admitting: Interventional Cardiology

## 2013-08-17 ENCOUNTER — Other Ambulatory Visit: Payer: Self-pay

## 2013-08-17 ENCOUNTER — Telehealth: Payer: Self-pay

## 2013-08-17 MED ORDER — METOPROLOL SUCCINATE ER 50 MG PO TB24: 50.0000 mg | ORAL_TABLET | Freq: Every day | ORAL | Status: DC

## 2013-08-17 NOTE — Telephone Encounter (Signed)
patient called wanting to change her bystolic to something else cause she is having trouble getting this med Talked to Dr Katrinka Blazing he said to change it to Metorplol succ 50mg  daily I called the patient back to let her know

## 2013-10-03 ENCOUNTER — Ambulatory Visit (INDEPENDENT_AMBULATORY_CARE_PROVIDER_SITE_OTHER): Payer: Medicare Other | Admitting: Family Medicine

## 2013-10-03 VITALS — BP 148/88 | HR 76 | Temp 98.3°F | Resp 16 | Ht 62.0 in | Wt 208.6 lb

## 2013-10-03 DIAGNOSIS — J069 Acute upper respiratory infection, unspecified: Secondary | ICD-10-CM

## 2013-10-03 DIAGNOSIS — Z20828 Contact with and (suspected) exposure to other viral communicable diseases: Secondary | ICD-10-CM

## 2013-10-03 MED ORDER — OSELTAMIVIR PHOSPHATE 75 MG PO CAPS: 75.0000 mg | ORAL_CAPSULE | Freq: Every day | ORAL | Status: DC

## 2013-10-03 NOTE — Progress Notes (Signed)
Chief Complaint:  Chief Complaint  Patient presents with  . Cough    x 10 days   . URI    x 10 days  . nasal drip    x 10 days    HPI: Deborah [name] is a 67 y.o. female who is here for  Cough sxs.  + PND Exposure to flu x 1 day, getting better with cough, green sputum. Had to use inhaler today. But getting better She denies fevers, + chills.  Her granddaughter was dx with influenza A  Past Medical History  Diagnosis Date  . 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 Ocular migraine   . Hyperlipidemia   . GERD (gastroesophageal reflux disease)   . Hypertension    Past Surgical History  Procedure Laterality Date  . Tubal ligation      G3 P3  . Tonsillectomy    . Endovenous ablation saphenous vein w/ laser  2003    complicated by DVT   . Dvt      2-3 x  . Endovenous ablation saphenous vein w/ laser  2013    Dr Ardyth Gal   History   Social History  . Marital Status: Married    Spouse Name: N/A    Number of Children: N/A  . Years of Education: N/A   Social History Main Topics  . Smoking status: Former Smoker    Quit date: 10/14/1969  . Smokeless tobacco: None     Comment: Quit at age 33 (smoked x 2 years)   . Alcohol Use: 2.4 oz/week    4 Glasses of wine per week     Comment: Red wine (4 glasses weekly or less)   . Drug Use: No  . Sexual Activity: None   Other Topics Concern  . None   Social History Narrative  . None   Family History  Problem Relation Age of Onset  . Arthritis Mother     OA  . Melanoma Mother 31    in situ  . Transient ischemic attack Mother   . COPD Mother   . Cancer Father     renal cancer  . Diabetes Father   . Heart attack Father     in 31s  . Hypertension Father   . Asthma Father   . Hypertension Sister   . Asthma Sister   . Kidney disease Sister     ? from Celebrex  . Kidney failure Sister   . Diabetes Brother   . Emphysema Paternal Grandfather   . Stroke Paternal Grandfather     in 33s  .  Stroke Paternal Grandmother     in 43s  . Diabetes Paternal Grandmother    Allergies  Allergen Reactions  . Propoxyphene-Acetaminophen     REACTION: GI Upset   Prior to Admission medications   Medication Sig Start Date End Date Taking? Authorizing Provider  albuterol (PROVENTIL HFA;VENTOLIN HFA) 108 (90 BASE) MCG/ACT inhaler Inhale 2 puffs into the lungs every 4 (four) hours as needed for wheezing. 07/29/12  Yes Pecola Lawless, MD  aspirin 325 MG tablet Take 325 mg by mouth daily.     Yes Historical Provider, MD  Cholecalciferol (VITAMIN D) 2000 UNITS CAPS Take by mouth daily.     Yes Historical Provider, MD  Coenzyme Q10 (COQ10) 100 MG CAPS Take by mouth daily.     Yes Historical Provider, MD  losartan-hydrochlorothiazide (HYZAAR) 100-25 MG per tablet Take one tablet  by mouth one time daily 08/02/13  Yes Lyn Records III, MD  metoprolol succinate (TOPROL XL) 50 MG 24 hr tablet Take 1 tablet (50 mg total) by mouth daily. Take with or immediately following a meal. 08/17/13  Yes Lesleigh Noe, MD  Multiple Vitamins-Minerals (CENTRUM SILVER PO) Take by mouth daily.     Yes Historical Provider, MD  Omega-3 Fatty Acids (FISH OIL) 1200 MG CAPS Take by mouth daily.     Yes Historical Provider, MD  rosuvastatin (CRESTOR) 20 MG tablet Take 1 tablet (20 mg total) by mouth daily. 08/03/12  Yes Pecola Lawless, MD  BYSTOLIC 20 MG TABS Take one tablet by mouth one time daily 08/13/13   Lyn Records III, MD     ROS: The patient denies fevers, night sweats, unintentional weight loss, chest pain, palpitations, wheezing, dyspnea on exertion, nausea, vomiting, abdominal pain, dysuria, hematuria, melena, numbness, weakness, or tingling.   All other systems have been reviewed and were otherwise negative with the exception of those mentioned in the HPI and as above.    PHYSICAL EXAM: Filed Vitals:   10/03/13 1712  BP: 148/88  Pulse: 76  Temp: 98.3 F (36.8 C)  Resp: 16   Filed Vitals:    10/03/13 1712  Height: 5\' 2"  (1.575 m)  Weight: 208 lb 9.6 oz (94.62 kg)   Body mass index is 38.14 kg/(m^2).  General: Alert, no acute distress HEENT:  Normocephalic, atraumatic, oropharynx patent. EOMI, PERRLA, TM nl, no sinus tenderness, + erythematous throat. Minimally boggy nares Cardiovascular:  Regular rate and rhythm, no rubs murmurs or gallops.  No Carotid bruits, radial pulse intact. No pedal edema.  Respiratory: Clear to auscultation bilaterally.  No wheezes, rales, or rhonchi.  No cyanosis, no use of accessory musculature GI: No organomegaly, abdomen is soft and non-tender, positive bowel sounds.  No masses. Skin: No rashes. Neurologic: Facial musculature symmetric. Psychiatric: Patient is appropriate throughout our interaction. Lymphatic: No cervical lymphadenopathy Musculoskeletal: Gait intact.   LABS: Results for orders placed in visit on 07/30/12  BASIC METABOLIC PANEL      Result Value Range   Sodium 140  135 - 145 mEq/L   Potassium 3.7  3.5 - 5.1 mEq/L   Chloride 102  96 - 112 mEq/L   CO2 30  19 - 32 mEq/L   Glucose, Bld 99  70 - 99 mg/dL   BUN 21  6 - 23 mg/dL   Creatinine, Ser 0.9  0.4 - 1.2 mg/dL   Calcium 9.5  8.4 - 16.1 mg/dL   GFR 09.60  >45.40 mL/min  LIPID PANEL      Result Value Range   Cholesterol 208 (*) 0 - 200 mg/dL   Triglycerides 981.1  0.0 - 149.0 mg/dL   HDL 91.47  >82.95 mg/dL   VLDL 62.1  0.0 - 30.8 mg/dL   Total CHOL/HDL Ratio 4    HEPATIC FUNCTION PANEL      Result Value Range   Total Bilirubin 0.9  0.3 - 1.2 mg/dL   Bilirubin, Direct 0.1  0.0 - 0.3 mg/dL   Alkaline Phosphatase 53  39 - 117 U/L   AST 19  0 - 37 U/L   ALT 26  0 - 35 U/L   Total Protein 7.1  6.0 - 8.3 g/dL   Albumin 3.9  3.5 - 5.2 g/dL  CK      Result Value Range   Total CK 60  7 - 177 U/L  TSH      Result Value Range   TSH 2.15  0.35 - 5.50 uIU/mL  HEMOGLOBIN A1C      Result Value Range   Hemoglobin A1C 5.3  4.6 - 6.5 %  LDL CHOLESTEROL, DIRECT      Result  Value Range   Direct LDL 147.1       EKG/XRAY:   Primary read interpreted by Dr[facility] Conley Rolls at [facility].   ASSESSMENT/PLAN: Encounter Diagnoses  Name Primary?  . Exposure to the flu Yes  . Viral URI with cough    Rx tamiflu 75 mg daily x 10 days for flu exposure She most likely has a viral cough with PND Salt water gargles and otc cough meds F/u prn  Gross sideeffects, risk and benefits, and alternatives of medications d/w patient. Patient is aware that all medications have potential sideeffects and we are unable to predict every sideeffect or drug-drug interaction that may occur.  Hamilton Capri PHUONG, DO 10/03/2013 5:44 PM

## 2013-10-07 ENCOUNTER — Other Ambulatory Visit: Payer: Self-pay | Admitting: Family Medicine

## 2013-10-10 ENCOUNTER — Ambulatory Visit (INDEPENDENT_AMBULATORY_CARE_PROVIDER_SITE_OTHER): Payer: Medicare Other | Admitting: Family Medicine

## 2013-10-10 VITALS — BP 154/88 | HR 72 | Temp 98.2°F | Resp 16 | Ht 62.25 in | Wt 206.4 lb

## 2013-10-10 DIAGNOSIS — N390 Urinary tract infection, site not specified: Secondary | ICD-10-CM

## 2013-10-10 DIAGNOSIS — R350 Frequency of micturition: Secondary | ICD-10-CM

## 2013-10-10 HISTORY — DX: Urinary tract infection, site not specified: N39.0

## 2013-10-10 LAB — POCT UA - MICROSCOPIC ONLY
Casts, Ur, LPF, POC: NEGATIVE
Crystals, Ur, HPF, POC: NEGATIVE
Mucus, UA: NEGATIVE
Yeast, UA: NEGATIVE

## 2013-10-10 LAB — POCT URINALYSIS DIPSTICK
Bilirubin, UA: NEGATIVE
Blood, UA: NEGATIVE
Glucose, UA: NEGATIVE
Ketones, UA: NEGATIVE
Leukocytes, UA: NEGATIVE
Nitrite, UA: NEGATIVE
Protein, UA: NEGATIVE
Spec Grav, UA: 1.015
Urobilinogen, UA: 0.2
pH, UA: 7.5

## 2013-10-10 MED ORDER — PHENAZOPYRIDINE HCL 200 MG PO TABS: 200.0000 mg | ORAL_TABLET | Freq: Three times a day (TID) | ORAL | Status: DC | PRN

## 2013-10-10 MED ORDER — SULFAMETHOXAZOLE-TMP DS 800-160 MG PO TABS: 1.0000 | ORAL_TABLET | Freq: Two times a day (BID) | ORAL | Status: DC

## 2013-10-10 NOTE — Patient Instructions (Signed)
Take Pyridium one pill 3 times daily as needed for bladder pain  Take Bactrim (sulfa) one twice daily with food for urinary tract infection. Stop promptly in the event of a rash or other allergic reaction  Return if problems  Urinary Tract Infection Urinary tract infections (UTIs) can develop anywhere along your urinary tract. Your urinary tract is your body's drainage system for removing wastes and extra water. Your urinary tract includes two kidneys, two ureters, a bladder, and a urethra. Your kidneys are a pair of bean-shaped organs. Each kidney is about the size of your fist. They are located below your ribs, one on each side of your spine. CAUSES Infections are caused by microbes, which are microscopic organisms, including fungi, viruses, and bacteria. These organisms are so small that they can only be seen through a microscope. Bacteria are the microbes that most commonly cause UTIs. SYMPTOMS  Symptoms of UTIs may vary by age and gender of the patient and by the location of the infection. Symptoms in young women typically include a frequent and intense urge to urinate and a painful, burning feeling in the bladder or urethra during urination. Older women and men are more likely to be tired, shaky, and weak and have muscle aches and abdominal pain. A fever may mean the infection is in your kidneys. Other symptoms of a kidney infection include pain in your back or sides below the ribs, nausea, and vomiting. DIAGNOSIS To diagnose a UTI, your caregiver will ask you about your symptoms. Your caregiver also will ask to provide a urine sample. The urine sample will be tested for bacteria and white blood cells. White blood cells are made by your body to help fight infection. TREATMENT  Typically, UTIs can be treated with medication. Because most UTIs are caused by a bacterial infection, they usually can be treated with the use of antibiotics. The choice of antibiotic and length of treatment depend on  your symptoms and the type of bacteria causing your infection. HOME CARE INSTRUCTIONS  If you were prescribed antibiotics, take them exactly as your caregiver instructs you. Finish the medication even if you feel better after you have only taken some of the medication.  Drink enough water and fluids to keep your urine clear or pale yellow.  Avoid caffeine, tea, and carbonated beverages. They tend to irritate your bladder.  Empty your bladder often. Avoid holding urine for long periods of time.  Empty your bladder before and after sexual intercourse.  After a bowel movement, women should cleanse from front to back. Use each tissue only once. SEEK MEDICAL CARE IF:   You have back pain.  You develop a fever.  Your symptoms do not begin to resolve within 3 days. SEEK IMMEDIATE MEDICAL CARE IF:   You have severe back pain or lower abdominal pain.  You develop chills.  You have nausea or vomiting.  You have continued burning or discomfort with urination. MAKE SURE YOU:   Understand these instructions.  Will watch your condition.  Will get help right away if you are not doing well or get worse. Document Released: 07/10/2005 Document Revised: 03/31/2012 Document Reviewed: 11/08/2011 [facility] Patient Information 2014 [place], [place].

## 2013-10-10 NOTE — Progress Notes (Signed)
Subjective: Since Christmas Eve the patient has been noting a foul odor to her urine in the mornings. She had a little respiratory tract infection and was coughing and with that a little urine leakage so is been wearing a pantiliner. She wonders whether that could have triggered her getting an infection. She has a history of UTIs but has not had one in a long time. She has been feeling a little bad, but no documented fevers. No nausea or vomiting. Her flank does hurt her a little bit  Objective: Alert healthy appearing lady in no major distress. Mild right CVA tenderness. Abdomen soft without mass or tenderness.  Results for orders placed in visit on 10/10/13  POCT UA - MICROSCOPIC ONLY      Result Value Range   WBC, Ur, HPF, POC TNTC     RBC, urine, microscopic 1-3     Bacteria, U Microscopic trace     Mucus, UA negative     Epithelial cells, urine per micros 0-3     Crystals, Ur, HPF, POC negative     Casts, Ur, LPF, POC negative     Yeast, UA negative    POCT URINALYSIS DIPSTICK      Result Value Range   Color, UA yellow     Clarity, UA cloudy     Glucose, UA negative     Bilirubin, UA negative     Ketones, UA negative     Spec Grav, UA 1.015     Blood, UA negative     pH, UA 7.5     Protein, UA negative     Urobilinogen, UA 0.2     Nitrite, UA negative     Leukocytes, UA Negative     Assessment: Acute urinary tract infection/cystitis  Plan: Bactrim DS twice a day Drink lots of fluids Peridium for when necessary use for bladder pain Culture will be sent

## 2013-10-12 ENCOUNTER — Telehealth: Payer: Self-pay | Admitting: Interventional Cardiology

## 2013-10-12 LAB — URINE CULTURE

## 2013-10-12 NOTE — Telephone Encounter (Signed)
New message   C/o blood pressure 158/80, constant ringing in the ears

## 2013-10-13 NOTE — Telephone Encounter (Signed)
lmom. for pt to return call.per Tresa Endo L.,RN instructions pt should monitor bp and heartrate. keep a log of reading.try taking metoprolol at different times daily,indicate the times taken in the log.pt should f/u with a nurse visit bp ck and bring bp readings with her

## 2013-10-13 NOTE — Telephone Encounter (Signed)
returned pt call.pt given Deborah Lawrence, RN instructions.pt will take metoprolol at different times daily.monitor her bp and heartrate, keep a log, pt does not want to make a nurse bp visit appt today.pt sts that her bp was 140/72 72bpm today, pt sts that she will call with readings, if the become elevated again she will go ahead a make a nurse bp check appt.

## 2013-10-13 NOTE — Telephone Encounter (Signed)
Follow Up: ° °Pt is returning Lisa's call.  °

## 2013-10-15 ENCOUNTER — Telehealth: Payer: Self-pay | Admitting: Internal Medicine

## 2013-10-15 NOTE — Telephone Encounter (Signed)
Plain Mucinex (NOT D) for thick secretions ;force NON dairy fluids .   Nasal cleansing in the shower as discussed with lather of mild shampoo.After 10 seconds wash off lather while  exhaling through nostrils. Make sure that all residual soap is removed to prevent irritation.  Nasacort AQ OTC1 spray in each nostril twice a day as needed using a "crossover" technique into opposite nostril spraying toward opposite ear @ 45 degree angle, not straight up into nostril.  Use a Neti pot daily only  as needed for significant sinus congestion; going from open side to congested side . Plain Allegra (NOT D )  160 daily , Loratidine 10 mg , OR Zyrtec 10 mg @ bedtime  as needed for itchy eyes & sneezing.

## 2013-10-15 NOTE — Telephone Encounter (Signed)
Please advise 

## 2013-10-15 NOTE — Telephone Encounter (Signed)
Called and left message for patient to call back. JG//CMA 

## 2013-10-15 NOTE — Telephone Encounter (Signed)
Patient states that she was seen over the weekend at [facility] for her UTI symptoms. She has finished the 5 day rx that she was given but still has symptoms. Patient scheduled for Monday (next available) but wants to be advised by Dr. Linna Darner about what she can do over the weekend until her appointment. Please advise.

## 2013-10-18 ENCOUNTER — Ambulatory Visit: Payer: Medicare Other | Admitting: Internal Medicine

## 2013-10-22 ENCOUNTER — Telehealth: Payer: Self-pay

## 2013-10-22 ENCOUNTER — Telehealth: Payer: Self-pay | Admitting: Family Medicine

## 2013-10-22 NOTE — Telephone Encounter (Deleted)
Cipro called into [facility]. Pt notified.

## 2013-10-22 NOTE — Telephone Encounter (Signed)
Message copied by Chinita Pester on Fri Oct 22, 2013  1:57 PM ------      Message from: HOPPER, DAVID H      Created: Sat Oct 16, 2013  9:19 AM       Call patient:      Culture grew a bacteria that is resistant to the sulfa she was treated with.  Prescribe cipro 500 #10 Take one twice daily.  Stop the bactrim (sulfa). ------

## 2013-10-22 NOTE — Telephone Encounter (Signed)
I informed pt of culture results. She stated that no one has called her before now and she has already been off the Sulfa for 5 days. She is still having back pain. Called in Cipro, pt understood. She will return if Sx persists.

## 2013-10-22 NOTE — Telephone Encounter (Signed)
Medication and allergies:  Reviewed and updated  90 day supply/mail order: na Local pharmacy: [facility] [address]   Immunizations due:  Shingles, PNA  A/P:   No changes to FH or PSH or personal hx MMG--11/2012--neg Bone Density--none noted CCS--never Flu vaccine--07/2013 Tdap--03/2011  To Discuss with Provider: Taking Cipro for UTI Weight management

## 2013-10-25 ENCOUNTER — Ambulatory Visit (INDEPENDENT_AMBULATORY_CARE_PROVIDER_SITE_OTHER): Payer: Medicare Other | Admitting: Internal Medicine

## 2013-10-25 ENCOUNTER — Encounter: Payer: Self-pay | Admitting: Internal Medicine

## 2013-10-25 VITALS — BP 142/84 | HR 96 | Temp 98.7°F | Ht 62.5 in | Wt 207.6 lb

## 2013-10-25 DIAGNOSIS — E785 Hyperlipidemia, unspecified: Secondary | ICD-10-CM

## 2013-10-25 DIAGNOSIS — I10 Essential (primary) hypertension: Secondary | ICD-10-CM

## 2013-10-25 DIAGNOSIS — R7309 Other abnormal glucose: Secondary | ICD-10-CM

## 2013-10-25 DIAGNOSIS — Z23 Encounter for immunization: Secondary | ICD-10-CM

## 2013-10-25 DIAGNOSIS — Z0000 Encounter for general adult medical examination without abnormal findings: Secondary | ICD-10-CM

## 2013-10-25 NOTE — Progress Notes (Signed)
Subjective:    Patient ID: Deborah [name], female    DOB: 1946-03-04, 68 y.o.   MRN: [id number]  HPI  Medicare Wellness Visit: Psychosocial and medical history were reviewed as required by Medicare (history related to abuse, antisocial behavior , firearm risk). Social history: Caffeine: 1/2 cup /day  , Alcohol: 2-3 drinks , Tobacco XBD:ZHGD 1971 Exercise:see below Personal safety/fall risk:no Limitations of activities of daily living:no Seatbelt/ smoke alarm use:yes Healthcare Power of Attorney/Living Will status: in place Ophthalmologic exam status:due  Hearing evaluation status:due Orientation: Oriented X 3 Memory and recall: good Spelling or math testing: good Depression/anxiety assessment: denied Foreign travel [place]  Immunization status for influenza/pneumonia/ shingles /tetanus: PNA today Transfusion history:no Preventive health care maintenance status: Colonoscopy/BMD/mammogram/Pap as per protocol/standard care:no colonoscopy to date ;[facility] reviewed Dental care:every 6 mos Chart reviewed and updated. Active issues reviewed and addressed as documented below.    Review of Systems A heart healthy diet is followed; exercise encompassed  7 times per week as walking dogs without symptoms until knee injury 10/01/13. S/P injection  Family history is negative for premature coronary disease. Advanced cholesterol testing reveals  LDL goal is less than 130 ; ideally < 100 . There is medication compliance with the low dose statin.  Full dose ASA taken 2-3 X/ week Specifically denied are  chest pain, palpitations, dyspnea, or claudication.  Significant abdominal symptoms, memory deficit, or myalgias not present. BP @ home 117/65-140/79.     Objective:   Physical Exam  Gen.: well-nourished in appearance. Alert, appropriate and cooperative throughout exam.  Head: Normocephalic without obvious abnormalities  Eyes: No corneal or conjunctival inflammation noted.  Pupils equal round reactive to light and accommodation. Extraocular motion intact.  Vision grossly normal with lenses Ears: External  ear exam reveals no significant lesions or deformities. Canals clear .TMs normal. Hearing is grossly normal bilaterally. Nose: External nasal exam reveals no deformity or inflammation. Nasal mucosa are pink and moist. No lesions or exudates noted. Septum  Minimally dislocated Mouth: Oral mucosa and oropharynx reveal no lesions or exudates. Teeth in good repair. Neck: No deformities, masses, or tenderness noted. Range of motion & Thyroid normal. Lungs: Normal respiratory effort; chest expands symmetrically. Lungs are clear to auscultation without rales, wheezes, or increased work of breathing. Heart: Normal rate and rhythm. Normal S1 and S2. No gallop, click, or rub. Grade 1/6 systolic murmur Abdomen: Bowel sounds normal; abdomen soft and nontender. No masses, organomegaly or hernias noted. Genitalia:  as per Gyn                                  Musculoskeletal/extremities: No deformity or scoliosis noted of  the thoracic or lumbar spine.  No clubbing, cyanosis, edema, or significant extremity  deformity noted. Range of motion normal .Tone & strength normal. Hand joints normal . Fingernail  health good. Able to lie down & sit up w/o help. Negative SLR bilaterally Vascular: Carotid, radial artery, dorsalis pedis and  posterior tibial pulses are full and equal. No bruits present. Neurologic: Alert and oriented x3. Deep tendon reflexes symmetrical but 1/2+ @ knees      Skin: Intact without suspicious lesions or rashes. Lymph: No cervical, axillary lymphadenopathy present. Psych: Mood and affect are normal. Normally interactive  Assessment & Plan:  #1 Medicare Wellness Exam; criteria met ; data entered #2 Problem List/Diagnoses reviewed Plan:  Assessments made/ Orders  entered

## 2013-10-25 NOTE — Telephone Encounter (Signed)
Noted.  Sorry I did not get back sooner.

## 2013-10-25 NOTE — Progress Notes (Signed)
Pre visit review using our clinic review tool, if applicable. No additional management support is needed unless otherwise documented below in the visit note. 

## 2013-10-25 NOTE — Patient Instructions (Signed)
Your next office appointment will be determined based upon review of your pending labs . Those instructions will be transmitted to you through My Chart    Minimal Blood Pressure Goal= AVERAGE < 140/90;  Ideal is an AVERAGE < 135/85. This AVERAGE should be calculated from @ least 5-7 BP readings taken @ different times of day on different days of week. You should not respond to isolated BP readings , but rather the AVERAGE for that week .Please bring your  blood pressure cuff to office visits to verify that it is reliable.It  can also be checked against the blood pressure device at the pharmacy.  

## 2013-10-26 ENCOUNTER — Telehealth: Payer: Self-pay | Admitting: Internal Medicine

## 2013-10-26 NOTE — Telephone Encounter (Signed)
Relevant patient education assigned to patient using Emmi. ° °

## 2013-10-29 ENCOUNTER — Ambulatory Visit (INDEPENDENT_AMBULATORY_CARE_PROVIDER_SITE_OTHER): Payer: Medicare Other

## 2013-10-29 DIAGNOSIS — E785 Hyperlipidemia, unspecified: Secondary | ICD-10-CM

## 2013-10-29 DIAGNOSIS — I10 Essential (primary) hypertension: Secondary | ICD-10-CM

## 2013-10-29 DIAGNOSIS — R7309 Other abnormal glucose: Secondary | ICD-10-CM

## 2013-10-29 LAB — BASIC METABOLIC PANEL
BUN: 19 mg/dL (ref 6–23)
CHLORIDE: 104 meq/L (ref 96–112)
CO2: 27 mEq/L (ref 19–32)
Calcium: 9.1 mg/dL (ref 8.4–10.5)
Creatinine, Ser: 0.9 mg/dL (ref 0.4–1.2)
GFR: 65.63 mL/min (ref >60.00)
Glucose, Bld: 114 mg/dL — ABNORMAL HIGH (ref 70–99)
POTASSIUM: 3.6 meq/L (ref 3.5–5.1)
SODIUM: 139 meq/L (ref 135–145)

## 2013-10-29 LAB — HEPATIC FUNCTION PANEL
ALT: 79 U/L — ABNORMAL HIGH (ref 0–35)
AST: 34 U/L (ref 0–37)
Albumin: 4 g/dL (ref 3.5–5.2)
Alkaline Phosphatase: 57 U/L (ref 39–117)
Bilirubin, Direct: 0 mg/dL (ref 0.0–0.3)
Total Bilirubin: 0.6 mg/dL (ref 0.3–1.2)
Total Protein: 7.1 g/dL (ref 6.0–8.3)

## 2013-10-29 LAB — LIPID PANEL
Cholesterol: 174 mg/dL (ref 0–200)
HDL: 55.7 mg/dL
LDL Cholesterol: 98 mg/dL (ref 0–99)
Total CHOL/HDL Ratio: 3
Triglycerides: 102 mg/dL (ref 0.0–149.0)
VLDL: 20.4 mg/dL (ref 0.0–40.0)

## 2013-10-29 LAB — TSH: TSH: 1.5 u[IU]/mL (ref 0.35–5.50)

## 2013-10-29 LAB — HEMOGLOBIN A1C: Hgb A1c MFr Bld: 5.7 % (ref 4.6–6.5)

## 2013-10-31 ENCOUNTER — Other Ambulatory Visit: Payer: Self-pay | Admitting: Internal Medicine

## 2013-10-31 DIAGNOSIS — R7402 Elevation of levels of lactic acid dehydrogenase (LDH): Secondary | ICD-10-CM | POA: Insufficient documentation

## 2013-10-31 DIAGNOSIS — R740 Nonspecific elevation of levels of transaminase and lactic acid dehydrogenase [LDH]: Principal | ICD-10-CM

## 2013-10-31 DIAGNOSIS — R7401 Elevation of levels of liver transaminase levels: Secondary | ICD-10-CM

## 2014-01-28 ENCOUNTER — Other Ambulatory Visit: Payer: Self-pay | Admitting: Interventional Cardiology

## 2014-02-28 ENCOUNTER — Other Ambulatory Visit: Payer: Self-pay | Admitting: Interventional Cardiology

## 2014-03-08 ENCOUNTER — Other Ambulatory Visit (INDEPENDENT_AMBULATORY_CARE_PROVIDER_SITE_OTHER): Payer: Medicare Other

## 2014-03-08 DIAGNOSIS — R740 Nonspecific elevation of levels of transaminase and lactic acid dehydrogenase [LDH]: Principal | ICD-10-CM

## 2014-03-08 DIAGNOSIS — R7402 Elevation of levels of lactic acid dehydrogenase (LDH): Secondary | ICD-10-CM

## 2014-03-08 DIAGNOSIS — R7401 Elevation of levels of liver transaminase levels: Secondary | ICD-10-CM

## 2014-03-08 LAB — HEPATIC FUNCTION PANEL
ALT: 65 U/L — AB (ref 0–35)
AST: 36 U/L (ref 0–37)
Albumin: 4 g/dL (ref 3.5–5.2)
Alkaline Phosphatase: 56 U/L (ref 39–117)
Bilirubin, Direct: 0.2 mg/dL (ref 0.0–0.3)
TOTAL PROTEIN: 7.1 g/dL (ref 6.0–8.3)
Total Bilirubin: 0.7 mg/dL (ref 0.2–1.2)

## 2014-03-15 ENCOUNTER — Ambulatory Visit: Payer: Medicare Other | Admitting: Interventional Cardiology

## 2014-03-17 ENCOUNTER — Telehealth: Payer: Self-pay | Admitting: Interventional Cardiology

## 2014-03-17 ENCOUNTER — Encounter: Payer: Self-pay | Admitting: Internal Medicine

## 2014-03-17 ENCOUNTER — Ambulatory Visit (INDEPENDENT_AMBULATORY_CARE_PROVIDER_SITE_OTHER): Payer: Medicare Other | Admitting: Interventional Cardiology

## 2014-03-17 ENCOUNTER — Encounter: Payer: Self-pay | Admitting: Interventional Cardiology

## 2014-03-17 VITALS — BP 142/90 | HR 80 | Ht 62.5 in | Wt 202.0 lb

## 2014-03-17 DIAGNOSIS — R7989 Other specified abnormal findings of blood chemistry: Secondary | ICD-10-CM

## 2014-03-17 DIAGNOSIS — I10 Essential (primary) hypertension: Secondary | ICD-10-CM

## 2014-03-17 DIAGNOSIS — E785 Hyperlipidemia, unspecified: Secondary | ICD-10-CM

## 2014-03-17 DIAGNOSIS — I471 Supraventricular tachycardia: Secondary | ICD-10-CM

## 2014-03-17 DIAGNOSIS — R945 Abnormal results of liver function studies: Secondary | ICD-10-CM

## 2014-03-17 MED ORDER — NEBIVOLOL HCL 10 MG PO TABS: 20.0000 mg | ORAL_TABLET | Freq: Every day | ORAL | Status: DC

## 2014-03-17 MED ORDER — NEBIVOLOL HCL 20 MG PO TABS: 20.0000 mg | ORAL_TABLET | Freq: Every day | ORAL | Status: DC

## 2014-03-17 NOTE — Patient Instructions (Addendum)
Your physician has recommended you make the following change in your medication:  1) STOP Metoprolol 2) STOP Crestor 3)Resume taking Bystolic 20mg  daily  Your physician discussed the importance of regular exercise and recommended that you start or continue a regular exercise program for good health.    Your physician wants you to follow-up in: 1 year You will receive a reminder letter in the mail two months in advance. If you don't receive a letter, please call our office to schedule the follow-up appointment.

## 2014-03-17 NOTE — Telephone Encounter (Signed)
New message     Cannot get 20mg  bystolic any more.  They can get 10mg  if ins will pay.  Pt saw Dr Tamala Julian this am.  Do you want to switch to 10mg ?

## 2014-03-17 NOTE — Progress Notes (Signed)
Patient ID: ARTI TRANG, female   DOB: [birth year], 68 y.o.   MRN: [id number]    [address] [place], [place]  [address] Phone: [phone] Fax:  [phone]  Date:  03/17/2014   ID:  YOLTZIN RANSOM, DOB [birth year], MRN [id number]  PCP:  Unice Cobble, MD   ASSESSMENT:  1. Hypertension 2. Hyperlipidemia 3. SVT 4. Elevated liver enzymes, ALT  PLAN:  1.  discontinue Crestor 2. Control lipids with lifestyle modification including weight loss, diet, and exercise 3. Switch back to Bystolic 20 mg daily and discontinue metoprolol succinate 4. Clinical followup in one year   SUBJECTIVE: MINAL STULLER is a 68 y.o. female  who is overweight. She is unable to exercise because of back and musculoskeletal complaints. She has had no palpitations to suggest PSVT. She denies syncope and chest pain. She is concerned about an elevated ALT. She is eliminated turmeric, Tylenol, and his decreased alcohol intake. The levels are still slightly elevated at around 2 times normal. She is doing with this through her primary physician, Dr. Ignacia Palma. She is also concerned about glucose intolerance and the impact that both statin therapy and beta blocker therapy as on glucose levels. She denies angina. She denies dyspnea.   Wt Readings from Last 3 Encounters:  03/17/14 202 lb (91.627 kg)  10/25/13 207 lb 9.6 oz (94.167 kg)  10/10/13 206 lb 6.4 oz (93.622 kg)     Past Medical History  Diagnosis Date  . 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 Ocular migraine   . Hyperlipidemia   . GERD (gastroesophageal reflux disease)   . Hypertension   . UTI (urinary tract infection) 10/10/13    E coli 70,000 colonies    Current Outpatient Prescriptions  Medication Sig Dispense Refill  . albuterol (PROVENTIL HFA;VENTOLIN HFA) 108 (90 BASE) MCG/ACT inhaler Inhale 2 puffs into the lungs every 4 (four) hours as needed for wheezing.  1 Inhaler  2  .  losartan-hydrochlorothiazide (HYZAAR) 100-25 MG per tablet TAKE ONE TABLET BY MOUTH ONE TIME DAILY   90 tablet  0  . metoprolol succinate (TOPROL-XL) 50 MG 24 hr tablet TAKE ONE TABLET BY MOUTH ONE TIME DAILY. take with or immediately following a meal  30 tablet  1  . Omega-3 Fatty Acids (FISH OIL) 1200 MG CAPS Take by mouth daily.        . rosuvastatin (CRESTOR) 20 MG tablet Take 20 mg by mouth daily. One time weekly       No current facility-administered medications for this visit.    Allergies:    Allergies  Allergen Reactions  . Propoxyphene N-Acetaminophen     REACTION: GI Upset    Social History:  The patient  reports that she quit smoking about 44 years ago. She does not have any smokeless tobacco history on file. She reports that she drinks about 1.8 ounces of alcohol per week. She reports that she does not use illicit drugs.   ROS:  Please see the history of present illness.   Fatigue and poor sleep habits   All other systems reviewed and negative.   OBJECTIVE: VS:  BP 142/90  Pulse 80  Ht 5' 2.5" (1.588 m)  Wt 202 lb (91.627 kg)  BMI 36.33 kg/m2 Well nourished, well developed, in no acute distress,  obese HEENT: normal Neck: JVD  flat. Carotid bruit  absent  Cardiac:  normal S1, S2; RRR; no murmur Lungs:  clear  to auscultation bilaterally, no wheezing, rhonchi or rales Abd: soft, nontender, no hepatomegaly Ext: Edema  absent. Pulses  2+ Skin: warm and dry Neuro:  CNs 2-12 intact, no focal abnormalities noted  EKG:   Normal sinus rhythm with nonspecific ST abnormality and poor R. wave progression V1 through V4. This is no change from prior tracing       Signed, Illene Labrador III, MD 03/17/2014 10:54 AM

## 2014-03-17 NOTE — Telephone Encounter (Signed)
return call to phramacy new Rx escribed .pt to take 2 X10mg  tablet daily.spoke with Oliver Pila tech

## 2014-03-18 ENCOUNTER — Encounter: Payer: Self-pay | Admitting: Interventional Cardiology

## 2014-03-18 ENCOUNTER — Other Ambulatory Visit: Payer: Self-pay | Admitting: Obstetrics and Gynecology

## 2014-03-18 DIAGNOSIS — R928 Other abnormal and inconclusive findings on diagnostic imaging of breast: Secondary | ICD-10-CM

## 2014-03-24 ENCOUNTER — Ambulatory Visit
Admission: RE | Admit: 2014-03-24 | Discharge: 2014-03-24 | Disposition: A | Discharge status: Home / Self Care | Payer: Medicare Other | Source: Ambulatory Visit | Attending: Obstetrics and Gynecology | Admitting: Obstetrics and Gynecology

## 2014-03-24 DIAGNOSIS — R928 Other abnormal and inconclusive findings on diagnostic imaging of breast: Secondary | ICD-10-CM

## 2014-05-05 ENCOUNTER — Other Ambulatory Visit: Payer: Self-pay | Admitting: Interventional Cardiology

## 2014-05-16 ENCOUNTER — Encounter: Payer: Self-pay | Admitting: Internal Medicine

## 2014-06-27 ENCOUNTER — Telehealth: Payer: Self-pay | Admitting: Internal Medicine

## 2014-06-27 DIAGNOSIS — R7402 Elevation of levels of lactic acid dehydrogenase (LDH): Secondary | ICD-10-CM

## 2014-06-27 DIAGNOSIS — R740 Nonspecific elevation of levels of transaminase and lactic acid dehydrogenase [LDH]: Principal | ICD-10-CM

## 2014-06-27 NOTE — Telephone Encounter (Signed)
Patient states she is due to have her liver function tests repeated. She is calling to make sure her orders are placed. Please let her know when this is complete. CB# [phone]

## 2014-06-27 NOTE — Telephone Encounter (Signed)
Patient has been notified

## 2014-06-28 ENCOUNTER — Other Ambulatory Visit (INDEPENDENT_AMBULATORY_CARE_PROVIDER_SITE_OTHER): Payer: Medicare Other

## 2014-06-28 DIAGNOSIS — R7401 Elevation of levels of liver transaminase levels: Secondary | ICD-10-CM

## 2014-06-28 DIAGNOSIS — R740 Nonspecific elevation of levels of transaminase and lactic acid dehydrogenase [LDH]: Principal | ICD-10-CM

## 2014-06-28 DIAGNOSIS — R7402 Elevation of levels of lactic acid dehydrogenase (LDH): Secondary | ICD-10-CM

## 2014-06-28 LAB — HEPATIC FUNCTION PANEL
ALBUMIN: 4.1 g/dL (ref 3.5–5.2)
ALT: 35 U/L (ref 0–35)
AST: 20 U/L (ref 0–37)
Alkaline Phosphatase: 52 U/L (ref 39–117)
Bilirubin, Direct: 0 mg/dL (ref 0.0–0.3)
TOTAL PROTEIN: 7.4 g/dL (ref 6.0–8.3)
Total Bilirubin: 0.6 mg/dL (ref 0.2–1.2)

## 2014-07-29 ENCOUNTER — Other Ambulatory Visit: Payer: Self-pay

## 2014-08-08 ENCOUNTER — Telehealth: Payer: Self-pay | Admitting: Interventional Cardiology

## 2014-08-08 NOTE — Telephone Encounter (Signed)
Will try to obtain prior auth for Bystolic 10mg  2 daily. She has taken Metoprolol in the past, and didn't think it worked as well.

## 2014-08-08 NOTE — Telephone Encounter (Signed)
New message      Pt says ins will only pay for 1 10mg  pill of bystolic.  She takes 20mg  daily.  She says Dr Tamala Julian can appeal it but pt will prefer Dr Tamala Julian change medication.  She does not want to deal with the ins company any longer.  Please call.

## 2014-08-10 NOTE — Telephone Encounter (Signed)
Patient advised it is probably too early to try to do a prior [place], since the insurance co is already paying for it now. She says as of January 1, they will only cover 1 pill. Better to do it in late December.

## 2014-08-25 ENCOUNTER — Other Ambulatory Visit: Payer: Self-pay | Admitting: Obstetrics and Gynecology

## 2014-08-25 DIAGNOSIS — N6310 Unspecified lump in the right breast, unspecified quadrant: Secondary | ICD-10-CM

## 2014-09-20 ENCOUNTER — Telehealth: Payer: Self-pay | Admitting: *Deleted

## 2014-09-20 ENCOUNTER — Ambulatory Visit: Payer: Medicare Other

## 2014-09-20 NOTE — Telephone Encounter (Signed)
Called patient to advise her that PA was pending and could possible take up to 72 hours but would call her as soon as I knew what the decision was.

## 2014-09-20 NOTE — Telephone Encounter (Signed)
F/u    Pt need to speak with you concerning prior auth. Please call pt.

## 2014-09-20 NOTE — Telephone Encounter (Signed)
PA on Bystolic 10mg  pending.

## 2014-09-20 NOTE — Telephone Encounter (Signed)
PA for Bystolic 10mg  was denied, but Bystolic 20mg  is available so will let Dr. Tamala Julian know so a new prescription can be sent to [facility] for patient.

## 2014-09-22 MED ORDER — NEBIVOLOL HCL 20 MG PO TABS: 20.0000 mg | ORAL_TABLET | Freq: Every day | ORAL | Status: DC

## 2014-09-22 NOTE — Telephone Encounter (Signed)
Rx for Bystolic 20mg  daily sent to pt pharmacy. FYI fwd to Dr.Smith

## 2014-09-26 ENCOUNTER — Ambulatory Visit
Admission: RE | Admit: 2014-09-26 | Discharge: 2014-09-26 | Disposition: A | Discharge status: Home / Self Care | Payer: Medicare Other | Source: Ambulatory Visit | Attending: Obstetrics and Gynecology | Admitting: Obstetrics and Gynecology

## 2014-09-26 DIAGNOSIS — N6310 Unspecified lump in the right breast, unspecified quadrant: Secondary | ICD-10-CM

## 2014-10-12 ENCOUNTER — Other Ambulatory Visit: Payer: Self-pay | Admitting: *Deleted

## 2014-10-12 MED ORDER — NEBIVOLOL HCL 20 MG PO TABS: 20.0000 mg | ORAL_TABLET | Freq: Every day | ORAL | Status: DC

## 2014-10-19 ENCOUNTER — Other Ambulatory Visit: Payer: Self-pay | Admitting: *Deleted

## 2014-10-19 MED ORDER — LOSARTAN POTASSIUM-HCTZ 100-25 MG PO TABS: 1.0000 | ORAL_TABLET | Freq: Every day | ORAL | Status: DC

## 2014-10-31 ENCOUNTER — Ambulatory Visit (INDEPENDENT_AMBULATORY_CARE_PROVIDER_SITE_OTHER): Payer: Medicare Other

## 2014-10-31 ENCOUNTER — Telehealth: Payer: Self-pay | Admitting: Interventional Cardiology

## 2014-10-31 DIAGNOSIS — I471 Supraventricular tachycardia: Secondary | ICD-10-CM

## 2014-10-31 DIAGNOSIS — R002 Palpitations: Secondary | ICD-10-CM

## 2014-10-31 NOTE — Telephone Encounter (Signed)
New Message  Pt called stated that she was experiencing bouts arythmia and wanted a f/u w/ Dr. Tamala Julian. Merdis Delay given appt w/ Ignacia Bayley on same day dr. Tamala Julian in office- 2/9 w/ 1:30. Pt also requested to speak w/ Rn. Please call back and discuss.

## 2014-10-31 NOTE — Telephone Encounter (Signed)
Returned pt call. She reports that she has been having increased palpitations over the last couple of weeks. She cut back on her caffeine intake and had some relief. She was awaken last night by an episodes of palpitations. She is taking bystolic 20mg  qd for suppression of pvc. She denies chest pain, sob, swelling. She is concerned that there might me a more critical arrhythmia. Pt will come to the office today for an EKG @1 :30pm

## 2014-10-31 NOTE — Progress Notes (Signed)
Patient arrived in the office for an EKG due to increased palpitations. EKG obtained and given to Kathrene Alu for review  Per Tera Helper. EKG unchanged compared to prior EkG.

## 2014-11-03 NOTE — Telephone Encounter (Signed)
Pt aware. Dr.Smith is agreeable pt should wear a holter monitor for palpitatios. Adv pt a scheduler from our office will call her to schedule. Pt verbalized understanding.

## 2014-11-08 ENCOUNTER — Encounter: Payer: Self-pay | Admitting: *Deleted

## 2014-11-08 ENCOUNTER — Encounter (INDEPENDENT_AMBULATORY_CARE_PROVIDER_SITE_OTHER): Payer: Medicare Other

## 2014-11-08 DIAGNOSIS — R002 Palpitations: Secondary | ICD-10-CM

## 2014-11-08 NOTE — Progress Notes (Signed)
Patient ID: Deborah [name], female   DOB: 1946/07/18, 69 y.o.   MRN: [id number] [facility] 48 hour holter monitor applied to patient.

## 2014-11-18 ENCOUNTER — Telehealth: Payer: Self-pay | Admitting: Interventional Cardiology

## 2014-11-18 DIAGNOSIS — I493 Ventricular premature depolarization: Secondary | ICD-10-CM

## 2014-11-18 NOTE — Telephone Encounter (Signed)
Returned pt call.pt aware of holter monitor results. -Rare PAC's and PVC's -Overall Normal Pt verbalized understanding  She believed the intensity and frequency of her PVC"s were dur to an electrolyte imbalance. She has added magnesium to her daily regimen and has had great improvement. She would to have labs drawn to evaluate. She has an upcoming appt on 2/8 with Estella Husk, PA, she is doing well and does not think she needs to keep appt. rqst to have bmet and mag drawn fwd to [place] for approval.

## 2014-11-18 NOTE — Telephone Encounter (Signed)
Get basic metabolic panel and magnesium level

## 2014-11-18 NOTE — Telephone Encounter (Signed)
New problem    Pt want to know results of her hoilter monitor.

## 2014-11-21 ENCOUNTER — Ambulatory Visit: Payer: Self-pay | Admitting: Physician Assistant

## 2014-11-21 NOTE — Telephone Encounter (Signed)
Pt aware of lab appt scheduled on 2/9 for a bmet, mag.

## 2014-11-22 ENCOUNTER — Ambulatory Visit: Payer: Self-pay | Admitting: Nurse Practitioner

## 2014-11-22 ENCOUNTER — Other Ambulatory Visit (INDEPENDENT_AMBULATORY_CARE_PROVIDER_SITE_OTHER): Payer: Medicare Other | Admitting: *Deleted

## 2014-11-22 DIAGNOSIS — I493 Ventricular premature depolarization: Secondary | ICD-10-CM

## 2014-11-22 LAB — BASIC METABOLIC PANEL
BUN: 21 mg/dL (ref 6–23)
CALCIUM: 9.6 mg/dL (ref 8.4–10.5)
CHLORIDE: 103 meq/L (ref 96–112)
CO2: 28 mEq/L (ref 19–32)
Creatinine, Ser: 0.91 mg/dL (ref 0.40–1.20)
GFR: 65.42 mL/min (ref >60.00)
Glucose, Bld: 107 mg/dL — ABNORMAL HIGH (ref 70–99)
Potassium: 3.9 mEq/L (ref 3.5–5.1)
SODIUM: 139 meq/L (ref 135–145)

## 2014-11-22 LAB — MAGNESIUM: MAGNESIUM: 2.1 mg/dL (ref 1.5–2.5)

## 2015-02-02 ENCOUNTER — Other Ambulatory Visit: Payer: Self-pay | Admitting: Interventional Cardiology

## 2015-02-27 ENCOUNTER — Other Ambulatory Visit: Payer: Self-pay | Admitting: Obstetrics and Gynecology

## 2015-02-27 DIAGNOSIS — N6001 Solitary cyst of right breast: Secondary | ICD-10-CM

## 2015-03-30 ENCOUNTER — Ambulatory Visit
Admission: RE | Admit: 2015-03-30 | Discharge: 2015-03-30 | Disposition: A | Discharge status: Home / Self Care | Payer: Medicare Other | Source: Ambulatory Visit | Attending: Obstetrics and Gynecology | Admitting: Obstetrics and Gynecology

## 2015-03-30 DIAGNOSIS — N6001 Solitary cyst of right breast: Secondary | ICD-10-CM

## 2015-03-30 DIAGNOSIS — N63 Unspecified lump in breast: Secondary | ICD-10-CM | POA: Diagnosis not present

## 2015-04-03 DIAGNOSIS — I83813 Varicose veins of bilateral lower extremities with pain: Secondary | ICD-10-CM | POA: Diagnosis not present

## 2015-04-10 ENCOUNTER — Other Ambulatory Visit: Payer: Self-pay

## 2015-05-22 ENCOUNTER — Other Ambulatory Visit: Payer: Self-pay | Admitting: Interventional Cardiology

## 2015-05-31 ENCOUNTER — Telehealth: Payer: Self-pay | Admitting: Interventional Cardiology

## 2015-05-31 NOTE — Telephone Encounter (Signed)
Request fwd to Dr.Smith for approval 

## 2015-05-31 NOTE — Telephone Encounter (Signed)
This is not an acceptable excuse from jury duty.

## 2015-05-31 NOTE — Telephone Encounter (Signed)
New message     Pt has received notice for jury duty for 6 weeks Pt is afraid that the stress of jury duty would cause her to start having PVC's again Pt would like to know if doctor would write a letter/note to excuse her from jury duty Pt stated she would come to office to get letter if doctor would write the excuse Please call pt to let her know if Dr. Tamala Julian will write excuse

## 2015-06-01 NOTE — Telephone Encounter (Signed)
Pt aware of Dr.Smith's response.This is not an acceptable excuse from jury duty. She is annoyed that he will not write an excuse from jury duty letter.  She is upset that if she has to report for 6 weeks the stress will increase her PVC's. She is also upset that she will have to leave her husband with her 2 grankids daily (they help babysit). She has an appt scheduled with Dr.Smith on 9/28. She wants an appt prior to 9/12 before she starts jury duty. Adv her I would be happy to try and schedule a work in appt with Dr.Smith prior to 9/12. Pt seemed annoyed during the entire conversation, she stated "whatever" "do whatever" Adv her of work in appt on 9/6 @ 12:15pm.

## 2015-06-20 ENCOUNTER — Ambulatory Visit: Payer: Medicare Other | Admitting: Interventional Cardiology

## 2015-07-09 NOTE — Progress Notes (Signed)
Cardiology Office Note   Date:  07/12/2015   ID:  Deborah [name], DOB 01/03/1946, MRN [id number]  PCP:  Unice Cobble, MD  Cardiologist:  Sinclair Grooms, MD   Chief Complaint  Patient presents with  . Hypertension      History of Present Illness: Deborah [name] is a 69 y.o. female who presents for hypertension, palpitations, history of DVT, hyperlipidemia, and family history of vascular disease.  She is under a lot of stress at home. She is taking care of her grandchildren. This takes away personal time and has decreased her ability to exercise. She has not had palpitations or syncope. The current medical regimen has not changed. Sometime back she was having frequent ectopy but feels it was related to a supplemental agent that she was using. This substance was discontinued and she has had no further problems.    Past Medical History  Diagnosis Date  . 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 Ocular migraine   . Hyperlipidemia   . GERD (gastroesophageal reflux disease)   . Hypertension   . UTI (urinary tract infection) 10/10/13    E coli 70,000 colonies    Past Surgical History  Procedure Laterality Date  . Tubal ligation      G3 P3  . Tonsillectomy    . Endovenous ablation saphenous vein w/ laser  9678    complicated by DVT   . Dvt      2-3 X ; on BCP with first  . Endovenous ablation saphenous vein w/ laser  2013    Dr Renaldo Reel  . No colonoscopy      SOC reviewed     Current Outpatient Prescriptions  Medication Sig Dispense Refill  . albuterol (PROVENTIL HFA;VENTOLIN HFA) 108 (90 BASE) MCG/ACT inhaler Inhale 2 puffs into the lungs every 4 (four) hours as needed for wheezing. 1 Inhaler 2  . Linoleic Acid Conjugated (CONJUGATED LINOLEIC ACID PO) Take 1,500 mLs by mouth daily.    Marland Kitchen losartan-hydrochlorothiazide (HYZAAR) 100-25 MG tablet Take 1 tablet by mouth  daily 90 tablet 3  . Nebivolol HCl (BYSTOLIC) 20 MG TABS Take 1 tablet by mouth   every day 90 tablet 3  . Omega-3 Fatty Acids (FISH OIL) 1200 MG CAPS Take 2 capsules by mouth daily.      No current facility-administered medications for this visit.    Allergies:   Propoxyphene n-acetaminophen    Social History:  The patient  reports that she quit smoking about 45 years ago. She has never used smokeless tobacco. She reports that she drinks about 1.8 oz of alcohol per week. She reports that she does not use illicit drugs.   Family History:  The patient's family history includes Arthritis in her mother; Asthma in her father and sister; Breast cancer in her sister; COPD in her mother; Cancer in her father; Diabetes in her brother, father, and paternal grandmother; Emphysema in her paternal grandfather; Heart attack in her father; Hypertension in her father and sister; Kidney disease in her sister; Kidney failure in her sister; Melanoma (age of onset: 110) in her mother; Stroke in her paternal grandfather and paternal grandmother; Transient ischemic attack in her mother.    ROS:  Please see the history of present illness.   Otherwise, review of systems are positive for cough, low back discomfort, and increased stress due to home circumstances..   All other systems are reviewed and negative.    PHYSICAL EXAM: VS:  BP 140/82 mmHg  Pulse 63  Ht 5\' 3"  (1.6 m)  Wt 86.691 kg (191 lb 1.9 oz)  BMI 33.86 kg/m2  SpO2 96% , BMI Body mass index is 33.86 kg/(m^2). GEN: Well nourished, well developed, in no acute distress HEENT: normal Neck: no JVD, carotid bruits, or masses Cardiac: RRR.  There is no murmur, rub, or gallop. There is no edema. Respiratory:  clear to auscultation bilaterally, normal work of breathing. GI: soft, nontender, nondistended, + BS MS: no deformity or atrophy Skin: warm and dry, no rash Neuro:  Strength and sensation are intact Psych: euthymic mood, full affect   EKG:  EKG is not ordered today.    Recent Labs: 11/22/2014: BUN 21; Creatinine, Ser 0.91;  Magnesium 2.1; Potassium 3.9; Sodium 139    Lipid Panel    Component Value Date/Time   CHOL 174 10/29/2013 0913   TRIG 102.0 10/29/2013 0913   HDL 55.70 10/29/2013 0913   CHOLHDL 3 10/29/2013 0913   VLDL 20.4 10/29/2013 0913   LDLCALC 98 10/29/2013 0913   LDLDIRECT 147.1 07/30/2012 0850      Wt Readings from Last 3 Encounters:  07/12/15 86.691 kg (191 lb 1.9 oz)  03/17/14 91.627 kg (202 lb)  10/25/13 94.167 kg (207 lb 9.6 oz)      Other studies Reviewed: Additional studies/ records that were reviewed today include: None. .    ASSESSMENT AND PLAN:  1. Essential hypertension Controlled  2. Paroxysmal supraventricular tachycardia No significant episodes  3. RAD (reactive airway disease), mild intermittent, uncomplicated Stable  4. Hyperlipidemia On therapy and followed by primary care    Current medicines are reviewed at length with the patient today.  The patient has the following concerns regarding medicines: None.  The following changes/actions have been instituted:    Monitors salt in diet  Lose weight  Aerobic exercise  Labs/ tests ordered today include:  No orders of the defined types were placed in this encounter.     Disposition:   FU with HS in 1 year  Signed, Sinclair Grooms, MD  07/12/2015 2:04 PM    [address], [place], [place]  [address] Phone: [phone]; Fax: [phone]

## 2015-07-12 ENCOUNTER — Encounter: Payer: Self-pay | Admitting: Interventional Cardiology

## 2015-07-12 ENCOUNTER — Ambulatory Visit (INDEPENDENT_AMBULATORY_CARE_PROVIDER_SITE_OTHER): Payer: Medicare Other | Admitting: Interventional Cardiology

## 2015-07-12 ENCOUNTER — Ambulatory Visit: Payer: Medicare Other | Admitting: Interventional Cardiology

## 2015-07-12 VITALS — BP 140/82 | HR 63 | Ht 63.0 in | Wt 191.1 lb

## 2015-07-12 DIAGNOSIS — I10 Essential (primary) hypertension: Secondary | ICD-10-CM

## 2015-07-12 DIAGNOSIS — J4520 Mild intermittent asthma, uncomplicated: Secondary | ICD-10-CM

## 2015-07-12 DIAGNOSIS — I471 Supraventricular tachycardia: Secondary | ICD-10-CM | POA: Diagnosis not present

## 2015-07-12 DIAGNOSIS — E785 Hyperlipidemia, unspecified: Secondary | ICD-10-CM

## 2015-07-12 MED ORDER — NEBIVOLOL HCL 20 MG PO TABS: ORAL_TABLET | ORAL | Status: DC

## 2015-07-12 MED ORDER — LOSARTAN POTASSIUM-HCTZ 100-25 MG PO TABS: ORAL_TABLET | ORAL | Status: DC

## 2015-07-12 NOTE — Patient Instructions (Signed)
Medication Instructions:  Your physician recommends that you continue on your current medications as directed. Please refer to the Current Medication list given to you today.   Labwork: None ordered  Testing/Procedures: None ordered  Follow-Up: Your physician wants you to follow-up in: 1 year with Dr.Smith You will receive a reminder letter in the mail two months in advance. If you don't receive a letter, please call our office to schedule the follow-up appointment.   Any Other Special Instructions Will Be Listed Below (If Applicable). Your physician discussed the importance of regular exercise and recommended that you start or continue a regular exercise program for good health.  Your physician encouraged you to lose weight for better health.  Limit your sodium intake

## 2015-09-26 DIAGNOSIS — I83893 Varicose veins of bilateral lower extremities with other complications: Secondary | ICD-10-CM | POA: Diagnosis not present

## 2015-10-11 ENCOUNTER — Telehealth: Payer: Self-pay

## 2015-10-11 NOTE — Telephone Encounter (Signed)
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 Very busy with childcare. Is concerned about coming in early for apt as she needs to be home in time to pick up grand child. Did mention Grandparents assistance but apparently dtr makes a little to much money. Also discussed stopping Crestor and when she did; liver enzymes corrected; Is concerned about chol and discussed low chol diet as well as trig are low; States she tries to eat healthy; Will discuss with Dr. Quay Burow next week at her visit. Did not want her to stress over getting here early; Dr. Quay Burow can completed AWV or can schedule for later in the year as appropriate.

## 2015-10-20 ENCOUNTER — Encounter: Payer: Self-pay | Admitting: Internal Medicine

## 2015-10-20 ENCOUNTER — Ambulatory Visit (INDEPENDENT_AMBULATORY_CARE_PROVIDER_SITE_OTHER): Payer: Medicare Other | Admitting: Internal Medicine

## 2015-10-20 VITALS — BP 132/88 | HR 61 | Temp 98.6°F | Resp 16 | Wt 198.0 lb

## 2015-10-20 DIAGNOSIS — Z23 Encounter for immunization: Secondary | ICD-10-CM

## 2015-10-20 DIAGNOSIS — Z139 Encounter for screening, unspecified: Secondary | ICD-10-CM | POA: Diagnosis not present

## 2015-10-20 DIAGNOSIS — I10 Essential (primary) hypertension: Secondary | ICD-10-CM | POA: Diagnosis not present

## 2015-10-20 DIAGNOSIS — J4520 Mild intermittent asthma, uncomplicated: Secondary | ICD-10-CM

## 2015-10-20 DIAGNOSIS — Z86718 Personal history of other venous thrombosis and embolism: Secondary | ICD-10-CM | POA: Diagnosis not present

## 2015-10-20 DIAGNOSIS — E785 Hyperlipidemia, unspecified: Secondary | ICD-10-CM

## 2015-10-20 NOTE — Assessment & Plan Note (Signed)
Taking ASA 325 mg daily

## 2015-10-20 NOTE — Assessment & Plan Note (Signed)
Has not tolerated more than one statin and will not take them again Can consider zetia Work on weight loss, improving diet and exercise Check lipid panel

## 2015-10-20 NOTE — Patient Instructions (Signed)
  We have reviewed your prior records including labs and tests today.  Test(s) ordered today. Your results will be released to MyChart (or called to you) after review, usually within 72hours after test completion. If any changes need to be made, you will be notified at that same time.  All other Health Maintenance issues reviewed.   All recommended immunizations and age-appropriate screenings are up-to-date.  No immunizations administered today.   Medications reviewed and updated.  No changes recommended at this time.        

## 2015-10-20 NOTE — Progress Notes (Signed)
Pre visit review using our clinic review tool, if applicable. No additional management support is needed unless otherwise documented below in the visit note. 

## 2015-10-20 NOTE — Progress Notes (Signed)
Subjective:    Patient ID: Deborah [name], female    DOB: Oct 14, 1946, 70 y.o.   MRN: [id number]  HPI She is here to establish with a new pcp.  She is here for routine follow up.   Hypertension: She is taking her medication daily. She is compliant with a low sodium diet.  She denies chest pain, palpitations, edema and regular headaches. She is exercising regularly - walking her dog and is generally very active.  She does monitor her blood pressure at home and is typically 124/76-1119/60.    Hyperlipidemia: She is not taking any medication now because she has not been able to tolerate statins. She is fairly compliant with a low fat/cholesterol diet, except for the holidays. She is exercising regularly - walks the dog.    RAD:  She uses the inhaler as needed.  She has intermittent wheezing, cough and occasional sob.  The albuterol inhaler works well.    DVT, history of:  She had a DVT in the past and a superficial phlebitis.  She has seen vascular and had vein treatments.  She is currently taking aspirin 325 mg daily.     Medications and allergies reviewed with patient and updated if appropriate.  Patient Active Problem List   Diagnosis Date Noted  . Paroxysmal supraventricular tachycardia ([place]) 03/17/2014  . Nonspecific elevation of levels of transaminase or lactic acid dehydrogenase (LDH) 10/31/2013  . RAD (reactive airway disease) 07/29/2012  . POSTMENOPAUSAL SYNDROME 01/16/2010  . Hyperlipidemia 07/19/2008  . Essential hypertension 07/19/2008  . DVT, HX OF 07/19/2008    Current Outpatient Prescriptions on File Prior to Visit  Medication Sig Dispense Refill  . albuterol (PROVENTIL HFA;VENTOLIN HFA) 108 (90 BASE) MCG/ACT inhaler Inhale 2 puffs into the lungs every 4 (four) hours as needed for wheezing. 1 Inhaler 2  . Linoleic Acid Conjugated (CONJUGATED LINOLEIC ACID PO) Take 1,500 mLs by mouth daily.    Marland Kitchen losartan-hydrochlorothiazide (HYZAAR) 100-25 MG tablet Take 1  tablet by mouth  daily 90 tablet 3  . Nebivolol HCl (BYSTOLIC) 20 MG TABS Take 1 tablet by mouth  every day 90 tablet 3   No current facility-administered medications on file prior to visit.    Past Medical History  Diagnosis Date  . 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 Ocular migraine   . Hyperlipidemia   . GERD (gastroesophageal reflux disease)   . Hypertension   . UTI (urinary tract infection) 10/10/13    E coli 70,000 colonies    Past Surgical History  Procedure Laterality Date  . Tubal ligation      G3 P3  . Tonsillectomy    . Endovenous ablation saphenous vein w/ laser  123456    complicated by DVT   . Dvt      2-3 X ; on BCP with first  . Endovenous ablation saphenous vein w/ laser  2013    Dr Renaldo Reel  . No colonoscopy      SOC reviewed    Social History   Social History  . Marital Status: Married    Spouse Name: N/A  . Number of Children: N/A  . Years of Education: N/A   Social History Main Topics  . Smoking status: Former Smoker    Quit date: 10/14/1969  . Smokeless tobacco: Never Used     Comment: Quit at age 17 (smoked x 2 years, up to 1 ppd)   . Alcohol Use: 1.8 oz/week  3 Glasses of wine per week     Comment: Red wine (4 glasses weekly or less)   . Drug Use: No  . Sexual Activity: Not on file   Other Topics Concern  . Not on file   Social History Narrative    Family History  Problem Relation Age of Onset  . Arthritis Mother     OA  . Melanoma Mother 30    in situ  . Transient ischemic attack Mother     late 7s  . COPD Mother   . Cancer Father     renal cancer  . Diabetes Father   . Heart attack Father     in 73s  . Hypertension Father   . Asthma Father   . Hypertension Sister   . Asthma Sister   . Kidney disease Sister     ? from Celebrex  . Kidney failure Sister   . Breast cancer Sister   . Diabetes Brother   . Emphysema Paternal Grandfather   . Stroke Paternal Grandfather     in 46s  . Stroke Paternal  Grandmother     in 72s  . Diabetes Paternal Grandmother     Review of Systems  Constitutional: Negative for fever and chills.  Respiratory: Positive for cough, shortness of breath and wheezing (related to RAD only - all symptoms intermittent - cold air or URI's).   Cardiovascular: Negative for chest pain, palpitations and leg swelling.  Gastrointestinal: Negative for nausea and abdominal pain.  Neurological: Negative for dizziness, light-headedness and headaches.       Objective:   Filed Vitals:   10/20/15 1037  BP: 132/88  Pulse: 61  Temp: 98.6 F (37 C)  Resp: 16   Filed Weights   10/20/15 1037  Weight: 198 lb (89.812 kg)   Body mass index is 35.08 kg/(m^2).   Physical Exam Constitutional: Appears well-developed and well-nourished. No distress.  Neck: Neck supple. No tracheal deviation present. No thyromegaly present.  No carotid bruit. No cervical adenopathy.   Cardiovascular: Normal rate, regular rhythm and normal heart sounds.   No murmur heard.  No edema Pulmonary/Chest: Effort normal and breath sounds normal. No respiratory distress. No wheezes.       Assessment & Plan:   See Problem List.

## 2015-10-20 NOTE — Assessment & Plan Note (Signed)
BP Readings from Last 3 Encounters:  10/20/15 132/88  07/12/15 140/82  03/17/14 142/90   BP elevated here today Continue current medication

## 2015-10-20 NOTE — Assessment & Plan Note (Signed)
Related to cold air and URI's, some allergies Uses albuterol as needed Discussed starting a maintenance med and at this time she does not feel that is necessary - will monitor

## 2015-11-24 ENCOUNTER — Telehealth: Payer: Self-pay | Admitting: Internal Medicine

## 2015-11-24 NOTE — Telephone Encounter (Signed)
Pt received a form that was given to her at her last appointment requesting a kit for colo guard. She has not received anything yet. Not sure if she needs to refax or if it usually takes this long. She can be reached at [phone]

## 2015-11-24 NOTE — Telephone Encounter (Signed)
Spoke with Pt. Mailing another cologaurd form.

## 2016-01-02 ENCOUNTER — Other Ambulatory Visit (INDEPENDENT_AMBULATORY_CARE_PROVIDER_SITE_OTHER): Payer: Medicare Other

## 2016-01-02 DIAGNOSIS — I10 Essential (primary) hypertension: Secondary | ICD-10-CM | POA: Diagnosis not present

## 2016-01-02 DIAGNOSIS — Z86718 Personal history of other venous thrombosis and embolism: Secondary | ICD-10-CM

## 2016-01-02 LAB — CBC WITH DIFFERENTIAL/PLATELET
BASOS PCT: 0.5 % (ref 0.0–3.0)
Basophils Absolute: 0 10*3/uL (ref 0.0–0.1)
Eosinophils Absolute: 0.2 10*3/uL (ref 0.0–0.7)
Eosinophils Relative: 2.8 % (ref 0.0–5.0)
HEMATOCRIT: 45.2 % (ref 36.0–46.0)
Hemoglobin: 15.4 g/dL — ABNORMAL HIGH (ref 12.0–15.0)
LYMPHS PCT: 23.4 % (ref 12.0–46.0)
Lymphs Abs: 1.9 10*3/uL (ref 0.7–4.0)
MCHC: 34.1 g/dL (ref 30.0–36.0)
MCV: 86.2 fl (ref 78.0–100.0)
Monocytes Absolute: 0.5 10*3/uL (ref 0.1–1.0)
Monocytes Relative: 6.3 % (ref 3.0–12.0)
NEUTROS ABS: 5.4 10*3/uL (ref 1.4–7.7)
Neutrophils Relative %: 67 % (ref 43.0–77.0)
Platelets: 301 10*3/uL (ref 150.0–400.0)
RBC: 5.24 Mil/uL — ABNORMAL HIGH (ref 3.87–5.11)
RDW: 12.5 % (ref 11.5–15.5)
WBC: 8 10*3/uL (ref 4.0–10.5)

## 2016-01-02 LAB — LIPID PANEL
Cholesterol: 194 mg/dL (ref 0–200)
HDL: 55.1 mg/dL (ref >39.00)
LDL Cholesterol: 120 mg/dL — ABNORMAL HIGH (ref 0–99)
NONHDL: 138.49
TRIGLYCERIDES: 92 mg/dL (ref 0.0–149.0)
Total CHOL/HDL Ratio: 4
VLDL: 18.4 mg/dL (ref 0.0–40.0)

## 2016-01-02 LAB — COMPREHENSIVE METABOLIC PANEL
ALBUMIN: 4.3 g/dL (ref 3.5–5.2)
ALK PHOS: 54 U/L (ref 39–117)
ALT: 32 U/L (ref 0–35)
AST: 18 U/L (ref 0–37)
BUN: 19 mg/dL (ref 6–23)
CO2: 29 mEq/L (ref 19–32)
Calcium: 9.6 mg/dL (ref 8.4–10.5)
Chloride: 104 mEq/L (ref 96–112)
Creatinine, Ser: 0.95 mg/dL (ref 0.40–1.20)
GFR: 62.05 mL/min (ref >60.00)
Glucose, Bld: 112 mg/dL — ABNORMAL HIGH (ref 70–99)
POTASSIUM: 3.9 meq/L (ref 3.5–5.1)
Sodium: 140 mEq/L (ref 135–145)
TOTAL PROTEIN: 7.2 g/dL (ref 6.0–8.3)
Total Bilirubin: 0.7 mg/dL (ref 0.2–1.2)

## 2016-01-02 LAB — TSH: TSH: 2.82 u[IU]/mL (ref 0.35–4.50)

## 2016-01-02 LAB — HEMOGLOBIN A1C: Hgb A1c MFr Bld: 5.6 % (ref 4.6–6.5)

## 2016-01-04 ENCOUNTER — Encounter: Payer: Self-pay | Admitting: Internal Medicine

## 2016-01-22 ENCOUNTER — Telehealth: Payer: Self-pay | Admitting: Internal Medicine

## 2016-01-22 NOTE — Telephone Encounter (Signed)
Pt called in and and is still having trouble with this colorgaurd test, can you please call her asap.  She is very frustrated .Marland Kitchen  [employer] is telling her that Dr Quay Burow has to sign it

## 2016-01-22 NOTE — Telephone Encounter (Signed)
Spoke with pt to clarify about Cologuard Req form. Pt got very upset and was raising her voice to the point of screaming. Informed the pt that I would be glad to sent her a new form with providers signature for her to send back in. She stated that she did not want to be the middle man and that I needed to call [employer] and figure it out myself. Informed pt that I would be glad to help her if should would talk without screaming. Pt then began to scream again. Phone call was then terminated.   Since have called [employer] and a new form has been faxed over to have provider sign. Will have MD sign an refax.

## 2016-01-23 ENCOUNTER — Encounter: Payer: Self-pay | Admitting: Gastroenterology

## 2016-01-23 NOTE — Telephone Encounter (Signed)
Cologuard form has been faxed over with MDs signature.

## 2016-01-24 ENCOUNTER — Other Ambulatory Visit (INDEPENDENT_AMBULATORY_CARE_PROVIDER_SITE_OTHER): Payer: Medicare Other

## 2016-01-24 ENCOUNTER — Encounter: Payer: Self-pay | Admitting: Internal Medicine

## 2016-01-24 ENCOUNTER — Ambulatory Visit (INDEPENDENT_AMBULATORY_CARE_PROVIDER_SITE_OTHER): Payer: Medicare Other | Admitting: Internal Medicine

## 2016-01-24 VITALS — BP 140/78 | HR 66 | Temp 98.6°F | Resp 20 | Wt 199.0 lb

## 2016-01-24 DIAGNOSIS — R197 Diarrhea, unspecified: Secondary | ICD-10-CM | POA: Insufficient documentation

## 2016-01-24 DIAGNOSIS — I471 Supraventricular tachycardia: Secondary | ICD-10-CM

## 2016-01-24 DIAGNOSIS — I10 Essential (primary) hypertension: Secondary | ICD-10-CM | POA: Diagnosis not present

## 2016-01-24 LAB — BASIC METABOLIC PANEL
BUN: 20 mg/dL (ref 6–23)
CO2: 31 meq/L (ref 19–32)
CREATININE: 0.95 mg/dL (ref 0.40–1.20)
Calcium: 10.5 mg/dL (ref 8.4–10.5)
Chloride: 104 mEq/L (ref 96–112)
GFR: 62.04 mL/min (ref >60.00)
Glucose, Bld: 117 mg/dL — ABNORMAL HIGH (ref 70–99)
Potassium: 4 mEq/L (ref 3.5–5.1)
Sodium: 142 mEq/L (ref 135–145)

## 2016-01-24 LAB — HEPATIC FUNCTION PANEL
ALK PHOS: 55 U/L (ref 39–117)
ALT: 27 U/L (ref 0–35)
AST: 18 U/L (ref 0–37)
Albumin: 4.6 g/dL (ref 3.5–5.2)
BILIRUBIN DIRECT: 0.2 mg/dL (ref 0.0–0.3)
BILIRUBIN TOTAL: 0.8 mg/dL (ref 0.2–1.2)
Total Protein: 7.7 g/dL (ref 6.0–8.3)

## 2016-01-24 LAB — CBC WITH DIFFERENTIAL/PLATELET
BASOS PCT: 0.3 % (ref 0.0–3.0)
Basophils Absolute: 0 10*3/uL (ref 0.0–0.1)
Eosinophils Absolute: 0.2 10*3/uL (ref 0.0–0.7)
Eosinophils Relative: 2.4 % (ref 0.0–5.0)
HEMATOCRIT: 47.1 % — AB (ref 36.0–46.0)
Hemoglobin: 16 g/dL — ABNORMAL HIGH (ref 12.0–15.0)
LYMPHS PCT: 21.2 % (ref 12.0–46.0)
Lymphs Abs: 1.9 10*3/uL (ref 0.7–4.0)
MCHC: 33.9 g/dL (ref 30.0–36.0)
MCV: 86.7 fl (ref 78.0–100.0)
MONOS PCT: 6.7 % (ref 3.0–12.0)
Monocytes Absolute: 0.6 10*3/uL (ref 0.1–1.0)
NEUTROS PCT: 69.4 % (ref 43.0–77.0)
Neutro Abs: 6.3 10*3/uL (ref 1.4–7.7)
Platelets: 314 10*3/uL (ref 150.0–400.0)
RBC: 5.43 Mil/uL — ABNORMAL HIGH (ref 3.87–5.11)
RDW: 12.9 % (ref 11.5–15.5)
WBC: 9.1 10*3/uL (ref 4.0–10.5)

## 2016-01-24 LAB — URINALYSIS, ROUTINE W REFLEX MICROSCOPIC
Bilirubin Urine: NEGATIVE
Ketones, ur: NEGATIVE
Leukocytes, UA: NEGATIVE
Nitrite: NEGATIVE
Specific Gravity, Urine: 1.015 (ref 1.000–1.030)
Urine Glucose: NEGATIVE
Urobilinogen, UA: 0.2 (ref 0.0–1.0)
pH: 6.5 (ref 5.0–8.0)

## 2016-01-24 NOTE — Progress Notes (Signed)
Subjective:    Patient ID: Deborah [name], female    DOB: May 09, 1946, 70 y.o.   MRN: [id number]  HPI  Here after dinner at [employer] mar 24, then mar 25 started with explosive diarrhea now ongoing x 11-12 days, assoc with some incontinence, overall much improved last few days,  now with soft and narrow less frequent and volume stools, and less gas, bloating.  No current fever, has some nausea off and on, but no vomiting.  Has not been taking magnesium as this has caused diarrhea in the past.  Stopped all supplements and limited her daily except for yogurt, limited gluten, tried immodium once but was too afraid to take more as fears getting worse again.  Denies worsening reflux, abd pain, dysphagia, or blood.  Has docmentation she brings today with neg hemocult stool done mar 27 (collect date mar 22).  Overall feels better , but has never had colonoscopy, did have cologuard process started, but MD never signed so not done, still willing to get this done. Pt denies chest pain, increased sob or doe, wheezing, orthopnea, PND, increased LE swelling, palpitations, dizziness or syncope. Past Medical History  Diagnosis Date  . DVT (deep venous thrombosis) (HCC)     2-3X  . Ocular migraine   . Hyperlipidemia   . GERD (gastroesophageal reflux disease)   . Hypertension   . UTI (urinary tract infection) 10/10/13    E coli 70,000 colonies   Past Surgical History  Procedure Laterality Date  . Tubal ligation      G3 P3  . Tonsillectomy    . Endovenous ablation saphenous vein w/ laser  123456    complicated by DVT   . Dvt      2-3 X ; on BCP with first  . Endovenous ablation saphenous vein w/ laser  2013    Dr Renaldo Reel  . No colonoscopy      SOC reviewed    reports that she quit smoking about 46 years ago. She has never used smokeless tobacco. She reports that she drinks about 1.8 oz of alcohol per week. She reports that she does not use illicit drugs. family history includes Arthritis in  her mother; Asthma in her father and sister; Breast cancer in her sister; COPD in her mother; Cancer in her father; Diabetes in her brother, father, and paternal grandmother; Emphysema in her paternal grandfather; Heart attack in her father; Hypertension in her father and sister; Kidney disease in her sister; Kidney failure in her sister; Melanoma (age of onset: 57) in her mother; Stroke in her paternal grandfather and paternal grandmother; Transient ischemic attack in her mother. Allergies  Allergen Reactions  . Statins     Muscle aches with some, elevated lft with crestor  . Propoxyphene N-Acetaminophen     REACTION: GI Upset   Current Outpatient Prescriptions on File Prior to Visit  Medication Sig Dispense Refill  . albuterol (PROVENTIL HFA;VENTOLIN HFA) 108 (90 BASE) MCG/ACT inhaler Inhale 2 puffs into the lungs every 4 (four) hours as needed for wheezing. 1 Inhaler 2  . aspirin 325 MG EC tablet Take 325 mg by mouth daily.    . Linoleic Acid Conjugated (CONJUGATED LINOLEIC ACID PO) Take 1,500 mLs by mouth daily.    Marland Kitchen losartan-hydrochlorothiazide (HYZAAR) 100-25 MG tablet Take 1 tablet by mouth  daily 90 tablet 3  . Nebivolol HCl (BYSTOLIC) 20 MG TABS Take 1 tablet by mouth  every day 90 tablet 3   No current facility-administered medications  on file prior to visit.   Review of Systems  Constitutional: Negative for unusual diaphoresis or night sweats HENT: Negative for ear swelling or discharge Eyes: Negative for worsening visual haziness  Respiratory: Negative for choking and stridor.   Gastrointestinal: Negative for distension or worsening eructation Genitourinary: Negative for retention or change in urine volume.  Musculoskeletal: Negative for other MSK pain or swelling Skin: Negative for color change and worsening wound Neurological: Negative for tremors and numbness other than noted  Psychiatric/Behavioral: Negative for decreased concentration or agitation other than above         Objective:   Physical Exam BP 140/78 mmHg  Pulse 66  Temp(Src) 98.6 F (37 C) (Oral)  Resp 20  Wt 199 lb (90.266 kg)  SpO2 98% VS noted, non toxic Constitutional: Pt appears in no apparent distress HENT: Head: NCAT.  Right Ear: External ear normal.  Left Ear: External ear normal.  Eyes: . Pupils are equal, round, and reactive to light. Conjunctivae and EOM are normal Neck: Normal range of motion. Neck supple.  Cardiovascular: Normal rate and regular rhythm.   Pulmonary/Chest: Effort normal and breath sounds without rales or wheezing.  Abd:  Soft, NT, ND, + BS except for mild tender left sided and low mid abd tender, no guarding or rebound Neurological: Pt is alert. Not confused , motor grossly intact Skin: Skin is warm. No rash, no LE edema Psychiatric: Pt behavior is normal. No agitation.     Assessment & Plan:

## 2016-01-24 NOTE — Patient Instructions (Addendum)
You can also tylenol as needed for pain and immodium OTC as needed  Please continue all other medications as before, and refills have been done if requested.  Please have the pharmacy call with any other refills you may need.  Please keep your appointments with your specialists as you may have planned  Please go to the LAB in the Basement (turn left off the elevator) for the tests to be done today  You will be contacted by phone if any changes need to be made immediately.  Otherwise, you will receive a letter about your results with an explanation, but please check with MyChart first.  Please remember to sign up for MyChart if you have not done so, as this will be important to you in the future with finding out test results, communicating by private email, and scheduling acute appointments online when needed.

## 2016-01-24 NOTE — Progress Notes (Signed)
Pre visit review using our clinic review tool, if applicable. No additional management support is needed unless otherwise documented below in the visit note. 

## 2016-01-26 NOTE — Assessment & Plan Note (Signed)
stable overall by history and exam, recent data reviewed with pt - rate controlled on BB, and pt to continue medical treatment as before,  to f/u any worsening symptoms or concerns

## 2016-01-26 NOTE — Assessment & Plan Note (Signed)
No evidence volume depletion,  Wt Readings from Last 3 Encounters:  01/24/16 199 lb (90.266 kg)  10/20/15 198 lb (89.812 kg)  07/12/15 191 lb 1.9 oz (86.691 kg)   Wt stable, BP stable BP Readings from Last 3 Encounters:  01/24/16 140/78  10/20/15 132/88  07/12/15 140/82   Cont all current meds

## 2016-01-26 NOTE — Assessment & Plan Note (Addendum)
Likely viral illness vs food pathogen borne with extended course, overall improved, afeb, exam with mild findings only, would hold on imaging or specific stool studies pending cbc/bmp/left/UA, d/w pt, reassured,  to f/u any worsening symptoms or concerns, and I did encourage pt to pursue the cologuard when able despite an admin setback, she agrees

## 2016-01-31 ENCOUNTER — Encounter: Payer: Self-pay | Admitting: Internal Medicine

## 2016-01-31 ENCOUNTER — Ambulatory Visit (INDEPENDENT_AMBULATORY_CARE_PROVIDER_SITE_OTHER): Payer: Medicare Other | Admitting: Internal Medicine

## 2016-01-31 VITALS — BP 136/82 | HR 63 | Temp 98.3°F | Resp 16 | Wt 196.0 lb

## 2016-01-31 DIAGNOSIS — R109 Unspecified abdominal pain: Secondary | ICD-10-CM

## 2016-01-31 DIAGNOSIS — R195 Other fecal abnormalities: Secondary | ICD-10-CM

## 2016-01-31 NOTE — Patient Instructions (Signed)
A cat scan was ordered and we will call you to schedule this.  Eat a low fat, BRAT diet.  Use imodium as needed.   Call if your symptoms get worse.

## 2016-01-31 NOTE — Progress Notes (Signed)
Subjective:    Patient ID: Deborah [name], female    DOB: 02/11/1946, 70 y.o.   MRN: [id number]  HPI She is here for an acute visit for diarrhea/soft stools.   Change in stool:  March 25 th she had explosive diarrhea - thought it was related to high fat meal she had at a restaurant - it started just after that.  The diarrhea persisted for 11 days. She was fecal incontinent because the diarrhea was so bad.  She started taking imodium, but has only taken a few doses.  The diarrhea slowed up, but her stool is still not normal.  She still has gas, soft stool.  She saw Dr Jenny Reichmann last week.  Blood work was normal.    She did have some mild epigastric discomfort at one point, but has not had abdominal pain.   Stool has changed color.  Her stool is soft and stringy.  It is not watery at this time.  No blood in the stool.  She has had 4 BM today already.  She denies fevers.   She denies antibiotics recently.  No travel.   Medications and allergies reviewed with patient and updated if appropriate.  Patient Active Problem List   Diagnosis Date Noted  . Diarrhea 01/24/2016  . Paroxysmal supraventricular tachycardia ([place]) 03/17/2014  . Nonspecific elevation of levels of transaminase or lactic acid dehydrogenase (LDH) 10/31/2013  . RAD (reactive airway disease) 07/29/2012  . Hyperlipidemia 07/19/2008  . Essential hypertension 07/19/2008  . DVT, HX OF 07/19/2008    Current Outpatient Prescriptions on File Prior to Visit  Medication Sig Dispense Refill  . albuterol (PROVENTIL HFA;VENTOLIN HFA) 108 (90 BASE) MCG/ACT inhaler Inhale 2 puffs into the lungs every 4 (four) hours as needed for wheezing. 1 Inhaler 2  . aspirin 325 MG EC tablet Take 325 mg by mouth daily.    . Linoleic Acid Conjugated (CONJUGATED LINOLEIC ACID PO) Take 1,500 mLs by mouth daily.    Marland Kitchen losartan-hydrochlorothiazide (HYZAAR) 100-25 MG tablet Take 1 tablet by mouth  daily 90 tablet 3  . Nebivolol HCl (BYSTOLIC) 20 MG  TABS Take 1 tablet by mouth  every day 90 tablet 3   No current facility-administered medications on file prior to visit.    Past Medical History  Diagnosis Date  . DVT (deep venous thrombosis) (HCC)     2-3X  . Ocular migraine   . Hyperlipidemia   . GERD (gastroesophageal reflux disease)   . Hypertension   . UTI (urinary tract infection) 10/10/13    E coli 70,000 colonies    Past Surgical History  Procedure Laterality Date  . Tubal ligation      G3 P3  . Tonsillectomy    . Endovenous ablation saphenous vein w/ laser  123456    complicated by DVT   . Dvt      2-3 X ; on BCP with first  . Endovenous ablation saphenous vein w/ laser  2013    Dr Renaldo Reel  . No colonoscopy      SOC reviewed    Social History   Social History  . Marital Status: Married    Spouse Name: N/A  . Number of Children: N/A  . Years of Education: N/A   Social History Main Topics  . Smoking status: Former Smoker    Quit date: 10/14/1969  . Smokeless tobacco: Never Used     Comment: Quit at age 79 (smoked x 2 years, up to 1 ppd)   .  Alcohol Use: 1.8 oz/week    3 Glasses of wine per week     Comment: Red wine (4 glasses weekly or less)   . Drug Use: No  . Sexual Activity: Not on file   Other Topics Concern  . Not on file   Social History Narrative    Family History  Problem Relation Age of Onset  . Arthritis Mother     OA  . Melanoma Mother 58    in situ  . Transient ischemic attack Mother     late 64s  . COPD Mother   . Cancer Father     renal cancer  . Diabetes Father   . Heart attack Father     in 72s  . Hypertension Father   . Asthma Father   . Hypertension Sister   . Asthma Sister   . Kidney disease Sister     ? from Celebrex  . Kidney failure Sister   . Breast cancer Sister   . Diabetes Brother   . Emphysema Paternal Grandfather   . Stroke Paternal Grandfather     in 52s  . Stroke Paternal Grandmother     in 48s  . Diabetes Paternal Grandmother     Review  of Systems  Constitutional: Negative for fever, chills and appetite change.       Gets flushed/hot intermittently  Cardiovascular: Positive for chest pain.  Gastrointestinal: Positive for nausea and abdominal pain (RUQ discomfort). Negative for vomiting and blood in stool.       Bloating  Neurological: Positive for light-headedness. Negative for headaches.       Objective:   Filed Vitals:   01/31/16 1442  BP: 136/82  Pulse: 63  Temp: 98.3 F (36.8 C)  Resp: 16   Filed Weights   01/31/16 1442  Weight: 196 lb (88.905 kg)   Body mass index is 34.73 kg/(m^2).   Physical Exam Constitutional: Appears well-developed and well-nourished. No distress.  Abdomen; soft, tenderness in epigastric and periumbilical region with palpation, no rebound or guarding, mass Ext: no edema      Assessment & Plan:    Diarrhea, change in stool, abdominal pain Pain in epigastric/umbilical region with palpation - no rebound or guarding Will order a ct scan to rule out colitis, diverticulitis Since she is not having diarrhea it is unlikely she has an infection - no need to do and unable to do stool studies Blood work reviewed - no need to repeat Has an appointment with GI next month She will call if her symptoms change BRAT, low fat diet   If ct scan is normal I would expect her stool to gradually return to normal

## 2016-01-31 NOTE — Progress Notes (Signed)
Pre visit review using our clinic review tool, if applicable. No additional management support is needed unless otherwise documented below in the visit note. 

## 2016-02-05 ENCOUNTER — Telehealth: Payer: Self-pay | Admitting: Internal Medicine

## 2016-02-05 DIAGNOSIS — R109 Unspecified abdominal pain: Secondary | ICD-10-CM

## 2016-02-05 DIAGNOSIS — R198 Other specified symptoms and signs involving the digestive system and abdomen: Secondary | ICD-10-CM

## 2016-02-05 NOTE — Telephone Encounter (Signed)
Per [employer] CT   Order need to be Abd & Pelvis  w contrast    Due to    R/o colitis or diverticulitis

## 2016-02-05 NOTE — Telephone Encounter (Signed)
New order placed

## 2016-02-08 ENCOUNTER — Ambulatory Visit (INDEPENDENT_AMBULATORY_CARE_PROVIDER_SITE_OTHER)
Admission: RE | Admit: 2016-02-08 | Discharge: 2016-02-08 | Disposition: A | Discharge status: Home / Self Care | Payer: Medicare Other | Source: Ambulatory Visit | Attending: Internal Medicine | Admitting: Internal Medicine

## 2016-02-08 DIAGNOSIS — R109 Unspecified abdominal pain: Secondary | ICD-10-CM | POA: Diagnosis not present

## 2016-02-08 DIAGNOSIS — R194 Change in bowel habit: Secondary | ICD-10-CM | POA: Diagnosis not present

## 2016-02-08 DIAGNOSIS — R198 Other specified symptoms and signs involving the digestive system and abdomen: Secondary | ICD-10-CM

## 2016-02-08 MED ORDER — IOPAMIDOL (ISOVUE-300) INJECTION 61%
100.0000 mL | Freq: Once | INTRAVENOUS | Status: AC | PRN
  Administered 2016-02-08: 100 mL via INTRAVENOUS

## 2016-02-21 ENCOUNTER — Encounter: Payer: Self-pay | Admitting: Gastroenterology

## 2016-02-21 ENCOUNTER — Ambulatory Visit (INDEPENDENT_AMBULATORY_CARE_PROVIDER_SITE_OTHER): Payer: Medicare Other | Admitting: Gastroenterology

## 2016-02-21 VITALS — BP 130/74 | HR 64 | Ht 61.75 in | Wt 197.0 lb

## 2016-02-21 DIAGNOSIS — Z1211 Encounter for screening for malignant neoplasm of colon: Secondary | ICD-10-CM

## 2016-02-21 DIAGNOSIS — R197 Diarrhea, unspecified: Secondary | ICD-10-CM

## 2016-02-21 MED ORDER — NA SULFATE-K SULFATE-MG SULF 17.5-3.13-1.6 GM/177ML PO SOLN: 1.0000 | Freq: Once | ORAL | Status: DC

## 2016-02-21 NOTE — Patient Instructions (Signed)
You have been scheduled for a colonoscopy. Please follow written instructions given to you at your visit today.  Please pick up your prep supplies at the pharmacy within the next 1-3 days. If you use inhalers (even only as needed), please bring them with you on the day of your procedure. Your physician has requested that you go to www.startemmi.com and enter the access code given to you at your visit today. This web site gives a general overview about your procedure. However, you should still follow specific instructions given to you by our office regarding your preparation for the procedure.  Go to the basement for labs today Follow up in 3 months

## 2016-02-22 ENCOUNTER — Other Ambulatory Visit: Payer: Medicare Other

## 2016-02-22 DIAGNOSIS — R197 Diarrhea, unspecified: Secondary | ICD-10-CM

## 2016-02-22 DIAGNOSIS — Z1211 Encounter for screening for malignant neoplasm of colon: Secondary | ICD-10-CM

## 2016-02-23 ENCOUNTER — Telehealth: Payer: Self-pay | Admitting: Gastroenterology

## 2016-02-23 LAB — OVA AND PARASITE EXAMINATION: OP: NONE SEEN

## 2016-02-23 MED ORDER — VSL#3 DS PO PACK: PACK | ORAL | Status: DC

## 2016-02-23 NOTE — Progress Notes (Signed)
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 She developed explosive diarrhea after she had a meal at a [employer] on March 25. She was with her friends and one of her friend ordered the same Lyndal Pulley which was a Trout and no one else got sick. She was having multiple watery stools for about 2 weeks and her symptoms improved after she started taking Imodium and is following a low fodmap diet. She had CT abdomen and pelvis with contrast on April 27 which did not show any significant acute pathology. For the past few days her stools are more formed and feels is slowly returning to her baseline. She never had a colonoscopy for colorectal cancer screening. Denies any fever, chills, nausea, vomiting, abdominal pain, melena or blood per rectum.   Outpatient Encounter Prescriptions as of 02/21/2016  Medication Sig  . losartan-hydrochlorothiazide (HYZAAR) 100-25 MG tablet Take 1 tablet by mouth  daily  . Nebivolol HCl (BYSTOLIC) 20 MG TABS Take 1 tablet by mouth  every day  . albuterol (PROVENTIL HFA;VENTOLIN HFA) 108 (90 BASE) MCG/ACT inhaler Inhale 2 puffs into the lungs every 4 (four) hours as needed for wheezing. (Patient not taking: Reported on 02/21/2016)  . Na Sulfate-K Sulfate-Mg Sulf (SUPREP BOWEL PREP) SOLN Take 1 kit by mouth once.  . [DISCONTINUED] aspirin 325 MG EC tablet Take 325 mg by mouth daily.  . [DISCONTINUED] Linoleic Acid Conjugated (CONJUGATED LINOLEIC ACID PO) Take 1,500 mLs by mouth daily.   No facility-administered encounter medications on file as of 02/21/2016.    Allergies as of 02/21/2016 - Review Complete 02/21/2016  Allergen Reaction Noted  . Statins  10/20/2015  . Propoxyphene n-acetaminophen   06/29/2008    Past Medical History  Diagnosis Date  . DVT (deep venous thrombosis) (HCC)     2-3X  . Ocular migraine   . Hyperlipidemia   . GERD (gastroesophageal reflux disease)   . Hypertension   . UTI (urinary tract infection) 10/10/13    E coli 70,000 colonies  . Pneumonia     Past Surgical History  Procedure Laterality Date  . Tubal ligation      G3 P3  . Tonsillectomy    . Endovenous ablation saphenous vein w/ laser  1914    complicated by DVT   . Dvt      2-3 X ; on BCP with first  . Endovenous ablation saphenous vein w/ laser  2013    Dr Renaldo Reel  . No colonoscopy      [place] reviewed  . Wisdom tooth extraction      Family History  Problem Relation Age of Onset  . Arthritis Mother     OA  . Melanoma Mother 26    in situ  . Transient ischemic attack Mother     late 68s  . COPD Mother   . Cancer Father     renal cancer  . Diabetes Father   . Heart attack Father     in 66s  . Hypertension Father   . Asthma Father   . Hypertension Sister   . Asthma Sister   . Kidney disease Sister     ? from Celebrex  .  Kidney failure Sister   . Breast cancer Sister   . Diabetes Brother   . Emphysema Paternal Grandfather   . Stroke Paternal Grandfather     in 59s  . Stroke Paternal Grandmother     in 28s  . Diabetes Paternal Grandmother   . Hypertension Paternal Grandmother   . Other Sister     Chron's or UC    Social History   Social History  . Marital Status: Married    Spouse Name: N/A  . Number of Children: 3  . Years of Education: N/A   Occupational History  . retired Therapist, sports    Social History Main Topics  . Smoking status: Former Smoker    Quit date: 10/14/1969  . Smokeless tobacco: Never Used     Comment: Quit at age 85 (smoked x 2 years, up to 1 ppd)   . Alcohol Use: 1.8 oz/week    3 Glasses of wine per week     Comment: Red wine (4 glasses weekly or less)   . Drug Use: No  . Sexual Activity: Not on file   Other Topics Concern  . Not on  file   Social History Narrative      Review of systems: Review of Systems  Constitutional: Negative for fever and chills.  HENT: Negative.   Eyes: Negative for blurred vision.  Respiratory: Negative for cough, shortness of breath and wheezing.   Cardiovascular: Negative for chest pain and palpitations.  Gastrointestinal: as per HPI Genitourinary: Negative for dysuria, urgency, frequency and hematuria.  Musculoskeletal: Negative for myalgias, back pain and joint pain.  Skin: Negative for itching and rash.  Neurological: Negative for dizziness, tremors, focal weakness, seizures and loss of consciousness.  Endo/Heme/Allergies: Negative for environmental allergies.  Psychiatric/Behavioral: Negative for depression, suicidal ideas and hallucinations.  All other systems reviewed and are negative.   Physical Exam: Filed Vitals:   02/21/16 1422  BP: 130/74  Pulse: 64   Gen:      No acute distress HEENT:  EOMI, sclera anicteric Neck:     No masses; no thyromegaly Lungs:    Clear to auscultation bilaterally; normal respiratory effort CV:         Regular rate and rhythm; no murmurs Abd:      + bowel sounds; soft, non-tender; no palpable masses, no distension Ext:    No edema; adequate peripheral perfusion Skin:      Warm and dry; no rash Neuro: alert and oriented x 3 Psych: normal mood and affect  Data Reviewed:  Reviewed chart in epic   Assessment and Plan/Recommendations:  70 year old female here with complaints of diarrhea which started about 6 weeks ago after eating a meal at a [employer]. Diarrhea is slowly improving Seems less likely to be associated with C. Difficile We'll check GI stool pathogen panel to exclude enteroinvasive bacterial infection She hasn't had screening colonoscopy for colorectal cancer, we'll schedule it and can also evaluate mucosa for any inflammatory changes Start Benefiber 1 tablespoon with meals Probiotic VSL #3 900 U twice daily Return  after procedure   Damaris Hippo , MD [phone] Mon-Fri 8a-5p [phone] after 5p, weekends, holidays  CC: Deborah Rail, MD

## 2016-02-23 NOTE — Telephone Encounter (Signed)
FYI Dr Reino Kent patient, she has already picked up VSL #3 [facility] the regular strength  And it cost $50     VSL #3 900 DS requires a prescription    [facility] and Cancelled order for the VSL#3 DS 900u [facility] cant get it and it cost 120$ for a 20 day supply  Patient will take the regular strength VSL

## 2016-02-23 NOTE — Telephone Encounter (Signed)
ok 

## 2016-02-26 LAB — GASTROINTESTINAL PATHOGEN PANEL PCR
C. DIFFICILE TOX A/B, PCR: NOT DETECTED
CRYPTOSPORIDIUM, PCR: NOT DETECTED
Campylobacter, PCR: NOT DETECTED
E COLI (ETEC) LT/ST, PCR: NOT DETECTED
E COLI (STEC) STX1/STX2, PCR: NOT DETECTED
E coli 0157, PCR: NOT DETECTED
Giardia lamblia, PCR: NOT DETECTED
Norovirus, PCR: NOT DETECTED
Rotavirus A, PCR: NOT DETECTED
Salmonella, PCR: NOT DETECTED
Shigella, PCR: NOT DETECTED

## 2016-02-27 ENCOUNTER — Other Ambulatory Visit: Payer: Self-pay

## 2016-02-27 DIAGNOSIS — Z1231 Encounter for screening mammogram for malignant neoplasm of breast: Secondary | ICD-10-CM

## 2016-03-05 ENCOUNTER — Encounter: Payer: Self-pay | Admitting: Gastroenterology

## 2016-03-05 ENCOUNTER — Ambulatory Visit (AMBULATORY_SURGERY_CENTER): Payer: Medicare Other | Admitting: Gastroenterology

## 2016-03-05 VITALS — BP 141/90 | HR 57 | Temp 98.6°F | Resp 12 | Ht 61.75 in | Wt 197.0 lb

## 2016-03-05 DIAGNOSIS — D123 Benign neoplasm of transverse colon: Secondary | ICD-10-CM

## 2016-03-05 DIAGNOSIS — R197 Diarrhea, unspecified: Secondary | ICD-10-CM

## 2016-03-05 MED ORDER — SODIUM CHLORIDE 0.9 % IV SOLN: 500.0000 mL | INTRAVENOUS | Status: DC

## 2016-03-05 NOTE — Progress Notes (Signed)
  [facility] Post-op Note  Patient: Deborah [name]  Procedure(s) Performed: colonoscopy  Patient Location: [facility] - Recovery Area  Anesthesia Type: Deep Sedation/Propofol  Level of Consciousness: awake, oriented and patient cooperative  Airway and Oxygen Therapy: Patient Spontanous Breathing  Post-op Pain: none  Post-op Assessment:  Post-op Vital signs reviewed, Patient's Cardiovascular Status Stable, Respiratory Function Stable, Patent Airway, No signs of Nausea or vomiting and Pain level controlled  Post-op Vital Signs: Reviewed and stable  Complications: No apparent anesthesia complications  Danyele Smejkal E 9:15 AM

## 2016-03-05 NOTE — Op Note (Addendum)
[facility] Patient Name: Deborah [name] Procedure Date: 03/05/2016 8:40 AM MRN: [id number] Endoscopist: Mauri Pole , MD Age: 70 Referring MD:  Date of Birth: 07/14/1946 Gender: Female Procedure:                Colonoscopy Indications:              Incidental - Clinically significant diarrhea of                            unexplained origin, Screening for colorectal                            malignant neoplasm Medicines:                Monitored Anesthesia Care Procedure:                Pre-Anesthesia Assessment:                           - Prior to the procedure, a History and Physical                            was performed, and patient medications and                            allergies were reviewed. The patient's tolerance of                            previous anesthesia was also reviewed. The risks                            and benefits of the procedure and the sedation                            options and risks were discussed with the patient.                            All questions were answered, and informed consent                            was obtained. Prior Anticoagulants: The patient has                            taken no previous anticoagulant or antiplatelet                            agents. ASA Grade Assessment: II - A patient with                            mild systemic disease. After reviewing the risks                            and benefits, the patient was deemed in  satisfactory condition to undergo the procedure.                           After obtaining informed consent, the colonoscope                            was passed under direct vision. Throughout the                            procedure, the patient's blood pressure, pulse, and                            oxygen saturations were monitored continuously. The                            Model CF-HQ190L [id number]) scope was introduced                      through the anus and advanced to the the terminal                            ileum, with identification of the appendiceal                            orifice and IC valve. The colonoscopy was performed                            without difficulty. The patient tolerated the                            procedure well. The quality of the bowel                            preparation was excellent. The terminal ileum,                            ileocecal valve, appendiceal orifice, and rectum                            were photographed. Scope In: 8:51:50 AM Scope Out: 9:02:05 AM Scope Withdrawal Time: 0 hours 7 minutes 1 second  Total Procedure Duration: 0 hours 10 minutes 15 seconds  Findings:                 The perianal and digital rectal examinations were                            normal.                           A 5 mm polyp was found in the distal transverse                            colon. The polyp was sessile. The polyp was removed  with a cold snare. Resection and retrieval were                            complete.                           Non-bleeding internal hemorrhoids were found during                            retroflexion. The hemorrhoids were medium-sized.                           The exam was otherwise normal throughout the                            examined colon.                           Biopsies for histology were taken with a cold                            forceps from the entire colon for evaluation of                            microscopic colitis. Complications:            No immediate complications. Estimated Blood Loss:     Estimated blood loss was minimal. Impression:               - One 5 mm polyp in the distal transverse colon,                            removed with a cold snare. Resected and retrieved.                           - Non-bleeding internal hemorrhoids.                           - Biopsies were  taken with a cold forceps from the                            entire colon for evaluation of microscopic colitis. Recommendation:           - Patient has a contact number available for                            emergencies. The signs and symptoms of potential                            delayed complications were discussed with the                            patient. Return to normal activities tomorrow.                            Written discharge instructions were provided to  the                            patient.                           - Resume previous diet.                           - Continue present medications.                           - Await pathology results.                           - Repeat colonoscopy in 5-10 years for surveillance                            based on pathology results.                           - Return to GI clinic PRN. Mauri Pole, MD 03/05/2016 9:10:59 AM This report has been signed electronically. Addendum Number: 1   Addendum Date: 03/05/2016 9:46:35 AM      Patient will call to schedule appointment for possible hemorrhoidal band       ligation if her symptoms are worse      Follow up in office as needed Mauri Pole, MD 03/05/2016 9:47:20 AM This report has been signed electronically.

## 2016-03-05 NOTE — Progress Notes (Signed)
Called to room to assist during endoscopic procedure.  Patient ID and intended procedure confirmed with present staff. Received instructions for my participation in the procedure from the performing physician.  

## 2016-03-05 NOTE — Patient Instructions (Signed)
YOU HAD AN ENDOSCOPIC PROCEDURE TODAY AT THE [facility]:   Refer to the procedure report that was given to you for any specific questions about what was found during the examination.  If the procedure report does not answer your questions, please call your gastroenterologist to clarify.  If you requested that your care partner not be given the details of your procedure findings, then the procedure report has been included in a sealed envelope for you to review at your convenience later.  YOU SHOULD EXPECT: Some feelings of bloating in the abdomen. Passage of more gas than usual.  Walking can help get rid of the air that was put into your GI tract during the procedure and reduce the bloating. If you had a lower endoscopy (such as a colonoscopy or flexible sigmoidoscopy) you may notice spotting of blood in your stool or on the toilet paper. If you underwent a bowel prep for your procedure, you may not have a normal bowel movement for a few days.  Please Note:  You might notice some irritation and congestion in your nose or some drainage.  This is from the oxygen used during your procedure.  There is no need for concern and it should clear up in a day or so.  SYMPTOMS TO REPORT IMMEDIATELY:   Following lower endoscopy (colonoscopy or flexible sigmoidoscopy):  Excessive amounts of blood in the stool  Significant tenderness or worsening of abdominal pains  Swelling of the abdomen that is new, acute  Fever of 100F or higher    For urgent or emergent issues, a gastroenterologist can be reached at any hour by calling [phone].   DIET: Your first meal following the procedure should be a small meal and then it is ok to progress to your normal diet. Heavy or fried foods are harder to digest and may make you feel nauseous or bloated.  Likewise, meals heavy in dairy and vegetables can increase bloating.  Drink plenty of fluids but you should avoid alcoholic beverages for 24  hours.  ACTIVITY:  You should plan to take it easy for the rest of today and you should NOT DRIVE or use heavy machinery until tomorrow (because of the sedation medicines used during the test).    FOLLOW UP: Our staff will call the number listed on your records the next business day following your procedure to check on you and address any questions or concerns that you may have regarding the information given to you following your procedure. If we do not reach you, we will leave a message.  However, if you are feeling well and you are not experiencing any problems, there is no need to return our call.  We will assume that you have returned to your regular daily activities without incident.  If any biopsies were taken you will be contacted by phone or by letter within the next 1-3 weeks.  Please call us at [phone] if you have not heard about the biopsies in 3 weeks.    SIGNATURES/CONFIDENTIALITY: You and/or your care partner have signed paperwork which will be entered into your electronic medical record.  These signatures attest to the fact that that the information above on your After Visit Summary has been reviewed and is understood.  Full responsibility of the confidentiality of this discharge information lies with you and/or your care-partner   Information on polyps and hemorrhoids given to you today        

## 2016-03-06 ENCOUNTER — Telehealth: Payer: Self-pay | Admitting: *Deleted

## 2016-03-06 NOTE — Telephone Encounter (Signed)
  Follow up Call-  Call back number 03/05/2016  Post procedure Call Back phone  # [phone] hm  Permission to leave phone message Yes     Patient questions:  Do you have a fever, pain , or abdominal swelling? No. Pain Score  0 *  Have you tolerated food without any problems? Yes.    Have you been able to return to your normal activities? Yes.    Do you have any questions about your discharge instructions: Diet   No. Medications  No. Follow up visit  No.  Do you have questions or concerns about your Care? No.  Actions: * If pain score is 4 or above: No action needed, pain <4.

## 2016-03-13 ENCOUNTER — Encounter: Payer: Self-pay | Admitting: Gastroenterology

## 2016-04-10 ENCOUNTER — Ambulatory Visit
Admission: RE | Admit: 2016-04-10 | Discharge: 2016-04-10 | Disposition: A | Discharge status: Home / Self Care | Payer: Medicare Other | Source: Ambulatory Visit

## 2016-04-10 DIAGNOSIS — Z1231 Encounter for screening mammogram for malignant neoplasm of breast: Secondary | ICD-10-CM

## 2016-04-29 LAB — HM PAP SMEAR: HM PAP: NEGATIVE

## 2016-04-30 LAB — HM PAP SMEAR: HM PAP: NEGATIVE

## 2016-06-25 ENCOUNTER — Other Ambulatory Visit: Payer: Self-pay | Admitting: Interventional Cardiology

## 2016-07-11 ENCOUNTER — Encounter: Payer: Self-pay | Admitting: Internal Medicine

## 2016-09-02 ENCOUNTER — Encounter: Payer: Self-pay | Admitting: Interventional Cardiology

## 2016-09-02 ENCOUNTER — Ambulatory Visit (INDEPENDENT_AMBULATORY_CARE_PROVIDER_SITE_OTHER): Payer: Medicare Other | Admitting: Interventional Cardiology

## 2016-09-02 VITALS — BP 158/94 | HR 65 | Ht 63.0 in | Wt 202.6 lb

## 2016-09-02 DIAGNOSIS — I10 Essential (primary) hypertension: Secondary | ICD-10-CM | POA: Diagnosis not present

## 2016-09-02 DIAGNOSIS — J4540 Moderate persistent asthma, uncomplicated: Secondary | ICD-10-CM | POA: Diagnosis not present

## 2016-09-02 DIAGNOSIS — E7849 Other hyperlipidemia: Secondary | ICD-10-CM

## 2016-09-02 DIAGNOSIS — E784 Other hyperlipidemia: Secondary | ICD-10-CM

## 2016-09-02 DIAGNOSIS — I471 Supraventricular tachycardia: Secondary | ICD-10-CM | POA: Diagnosis not present

## 2016-09-02 MED ORDER — NEBIVOLOL HCL 20 MG PO TABS: 20.0000 mg | ORAL_TABLET | Freq: Every day | ORAL | 3 refills | Status: DC

## 2016-09-02 MED ORDER — LOSARTAN POTASSIUM-HCTZ 100-25 MG PO TABS: 1.0000 | ORAL_TABLET | Freq: Every day | ORAL | 3 refills | Status: DC

## 2016-09-02 NOTE — Patient Instructions (Signed)
Medication Instructions:  None  Labwork: None  Testing/Procedures: None  Follow-Up: Your physician wants you to follow-up in: 1 year with Dr. Tamala Julian. You will receive a reminder letter in the mail two months in advance. If you don't receive a letter, please call our office to schedule the follow-up appointment.   Any Other Special Instructions Will Be Listed Below (If Applicable).  Please contact our office in 4-6 weeks with blood pressure readings.  Your physician discussed the importance of regular exercise and recommended that you start or continue a regular exercise program for good health.   Low-Sodium Eating Plan Sodium raises blood pressure and causes water to be held in the body. Getting less sodium from food will help lower your blood pressure, reduce any swelling, and protect your heart, liver, and kidneys. We get sodium by adding salt (sodium chloride) to food. Most of our sodium comes from canned, boxed, and frozen foods. Restaurant foods, fast foods, and pizza are also very high in sodium. Even if you take medicine to lower your blood pressure or to reduce fluid in your body, getting less sodium from your food is important. What is my plan? Most people should limit their sodium intake to 2,300 mg a day. Your health care provider recommends that you limit your sodium intake to _2 gram/ 2,000 mg_ a day. What do I need to know about this eating plan? For the low-sodium eating plan, you will follow these general guidelines:  Choose foods with a % Daily Value for sodium of less than 5% (as listed on the food label).  Use salt-free seasonings or herbs instead of table salt or sea salt.  Check with your health care provider or pharmacist before using salt substitutes.  Eat fresh foods.  Eat more vegetables and fruits.  Limit canned vegetables. If you do use them, rinse them well to decrease the sodium.  Limit cheese to 1 oz (28 g) per day.  Eat lower-sodium products,  often labeled as "lower sodium" or "no salt added."  Avoid foods that contain monosodium glutamate (MSG). MSG is sometimes added to [place] food and some canned foods.  Check food labels (Nutrition Facts labels) on foods to learn how much sodium is in one serving.  Eat more home-cooked food and less restaurant, buffet, and fast food.  When eating at a restaurant, ask that your food be prepared with less salt, or no salt if possible. How do I read food labels for sodium information? The Nutrition Facts label lists the amount of sodium in one serving of the food. If you eat more than one serving, you must multiply the listed amount of sodium by the number of servings. Food labels may also identify foods as:  Sodium free-Less than 5 mg in a serving.  Very low sodium-35 mg or less in a serving.  Low sodium-140 mg or less in a serving.  Light in sodium-50% less sodium in a serving. For example, if a food that usually has 300 mg of sodium is changed to become light in sodium, it will have 150 mg of sodium.  Reduced sodium-25% less sodium in a serving. For example, if a food that usually has 400 mg of sodium is changed to reduced sodium, it will have 300 mg of sodium. What foods can I eat? Grains  Low-sodium cereals, including oats, puffed wheat and rice, and shredded wheat cereals. Low-sodium crackers. Unsalted rice and pasta. Lower-sodium bread. Vegetables  Frozen or fresh vegetables. Low-sodium or reduced-sodium canned vegetables.  Low-sodium or reduced-sodium tomato sauce and paste. Low-sodium or reduced-sodium tomato and vegetable juices. Fruits  Fresh, frozen, and canned fruit. Fruit juice. Meat and Other Protein Products  Low-sodium canned tuna and salmon. Fresh or frozen meat, poultry, seafood, and fish. Lamb. Unsalted nuts. Dried beans, peas, and lentils without added salt. Unsalted canned beans. Homemade soups without salt. Eggs. Dairy  Milk. Soy milk. Ricotta cheese. Low-sodium or  reduced-sodium cheeses. Yogurt. Condiments  Fresh and dried herbs and spices. Salt-free seasonings. Onion and garlic powders. Low-sodium varieties of mustard and ketchup. Fresh or refrigerated horseradish. Lemon juice. Fats and Oils  Reduced-sodium salad dressings. Unsalted butter. Other  Unsalted popcorn and pretzels. The items listed above may not be a complete list of recommended foods or beverages. Contact your dietitian for more options.  What foods are not recommended? Grains  Instant hot cereals. Bread stuffing, pancake, and biscuit mixes. Croutons. Seasoned rice or pasta mixes. Noodle soup cups. Boxed or frozen macaroni and cheese. Self-rising flour. Regular salted crackers. Vegetables  Regular canned vegetables. Regular canned tomato sauce and paste. Regular tomato and vegetable juices. Frozen vegetables in sauces. Salted [place] fries. Olives. Angie Fava. Relishes. Sauerkraut. Salsa. Meat and Other Protein Products  Salted, canned, smoked, spiced, or pickled meats, seafood, or fish. Bacon, ham, sausage, hot dogs, corned beef, chipped beef, and packaged luncheon meats. Salt pork. Jerky. Pickled herring. Anchovies, regular canned tuna, and sardines. Salted nuts. Dairy  Processed cheese and cheese spreads. Cheese curds. Blue cheese and cottage cheese. Buttermilk. Condiments  Onion and garlic salt, seasoned salt, table salt, and sea salt. Canned and packaged gravies. Worcestershire sauce. Tartar sauce. Barbecue sauce. Teriyaki sauce. Soy sauce, including reduced sodium. Steak sauce. Fish sauce. Oyster sauce. Cocktail sauce. Horseradish that you find on the shelf. Regular ketchup and mustard. Meat flavorings and tenderizers. Bouillon cubes. Hot sauce. Tabasco sauce. Marinades. Taco seasonings. Relishes. Fats and Oils  Regular salad dressings. Salted butter. Margarine. Ghee. Bacon fat. Other  Potato and tortilla chips. Corn chips and puffs. Salted popcorn and pretzels. Canned or dried soups.  Pizza. Frozen entrees and pot pies. The items listed above may not be a complete list of foods and beverages to avoid. Contact your dietitian for more information.  This information is not intended to replace advice given to you by your health care provider. Make sure you discuss any questions you have with your health care provider. Document Released: 03/22/2002 Document Revised: 03/07/2016 Document Reviewed: 08/04/2013 Elsevier Interactive Patient Education  2017 [employer].     If you need a refill on your cardiac medications before your next appointment, please call your pharmacy.

## 2016-09-02 NOTE — Progress Notes (Signed)
Cardiology Office Note    Date:  09/02/2016   ID:  Deborah [name], DOB [birth year], MRN [id number]  PCP:  Binnie Rail, MD  Cardiologist: Sinclair Grooms, MD   Chief Complaint  Patient presents with  . Follow-up    Hypertension    History of Present Illness:  Deborah [name] is a 70 y.o. female who presents for hypertension, palpitations, history of DVT, hyperlipidemia, and family history of vascular disease.  She is keeping her 2 young grandchildren, 62 7. She denies angina, prolonged palpitations, lightheadedness, dizziness, and syncope. There is no significant orthopnea. She has intermittent dyspnea related to asthma.   Past Medical History:  Diagnosis Date  . Clotting disorder (HCC)    DVT  . 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 GERD (gastroesophageal reflux disease)   . Hyperlipidemia   . Hypertension   . Ocular migraine   . Pneumonia   . UTI (urinary tract infection) 10/10/13   E coli 70,000 colonies    Past Surgical History:  Procedure Laterality Date  . DVT     2-3 X ; on BCP with first  . ENDOVENOUS ABLATION SAPHENOUS VEIN W/ LASER  123456   complicated by DVT   . ENDOVENOUS ABLATION SAPHENOUS VEIN W/ LASER  2013   Dr Renaldo Reel  . no colonoscopy     SOC reviewed  . TONSILLECTOMY    . TUBAL LIGATION     G3 P3  . WISDOM TOOTH EXTRACTION      Current Medications: Outpatient Medications Prior to Visit  Medication Sig Dispense Refill  . albuterol (PROVENTIL HFA;VENTOLIN HFA) 108 (90 BASE) MCG/ACT inhaler Inhale 2 puffs into the lungs every 4 (four) hours as needed for wheezing. 1 Inhaler 2  . losartan-hydrochlorothiazide (HYZAAR) 100-25 MG tablet Take 1 tablet by mouth daily. Take 1 tablet by mouth daily. Please keep 09/02/16 appt for further refills 75 tablet 0  . Nebivolol HCl (BYSTOLIC) 20 MG TABS Take 1 tablet (20 mg total) by mouth daily. Please keep 09/02/16 appt for further refills 75 tablet 0  . Probiotic Product  (VSL#3 DS) PACK Use one twice a day 60 each 3   No facility-administered medications prior to visit.      Allergies:   Statins and Propoxyphene n-acetaminophen   Social History   Social History  . Marital status: Married    Spouse name: N/A  . Number of children: 3  . Years of education: N/A   Occupational History  . retired Therapist, sports    Social History Main Topics  . Smoking status: Former Smoker    Quit date: 10/14/1969  . Smokeless tobacco: Never Used     Comment: Quit at age 86 (smoked x 2 years, up to 1 ppd)   . Alcohol use 1.2 oz/week    2 Glasses of wine per week     Comment: Red wine (2 glasses weekly or less)   . Drug use: No  . Sexual activity: Not Asked   Other Topics Concern  . None   Social History Narrative  . None     Family History:  The patient's family history includes Arthritis in her mother; Asthma in her father and sister; Breast cancer in her sister; COPD in her mother; Cancer in her father; Diabetes in her brother, father, and paternal grandmother; Emphysema in her paternal grandfather; Heart attack in her father; Hypertension in her father, paternal grandmother, and sister; Kidney disease in her  sister; Kidney failure in her sister; Melanoma (age of onset: 28) in her mother; Other in her sister; Stroke in her paternal grandfather and paternal grandmother; Transient ischemic attack in her mother.   ROS:   Please see the history of present illness.    Upper respiratory drainage and congestion. She is not having time to adequately care for herself because of responsibilities related to the grandchildren.  All other systems reviewed and are negative.   PHYSICAL EXAM:   VS:  BP (!) 158/94   Pulse 65   Ht 5\' 3"  (1.6 m)   Wt 202 lb 9.6 oz (91.9 kg)   BMI 35.89 kg/m    GEN: Well nourished, well developed, in no acute distress  HEENT: normal  Neck: no JVD, carotid bruits, or masses Cardiac: RRR; no murmurs, rubs, or gallops,no edema  Respiratory:  clear to  auscultation bilaterally, normal work of breathing GI: soft, nontender, nondistended, + BS MS: no deformity or atrophy  Skin: warm and dry, no rash Neuro:  Alert and Oriented x 3, Strength and sensation are intact Psych: euthymic mood, full affect  Wt Readings from Last 3 Encounters:  09/02/16 202 lb 9.6 oz (91.9 kg)  03/05/16 197 lb (89.4 kg)  02/21/16 197 lb (89.4 kg)      Studies/Labs Reviewed:   EKG:  EKG  Normal sinus rhythm, poor R-wave progression, leftward axis, no change compared to prior.  Recent Labs: 01/02/2016: TSH 2.82 01/24/2016: ALT 27; BUN 20; Creatinine, Ser 0.95; Hemoglobin 16.0; Platelets 314.0; Potassium 4.0; Sodium 142   Lipid Panel    Component Value Date/Time   CHOL 194 01/02/2016 0923   TRIG 92.0 01/02/2016 0923   HDL 55.10 01/02/2016 0923   CHOLHDL 4 01/02/2016 0923   VLDL 18.4 01/02/2016 0923   LDLCALC 120 (H) 01/02/2016 0923   LDLDIRECT 147.1 07/30/2012 0850    Additional studies/ records that were reviewed today include:  No new data    ASSESSMENT:    1. SVT (supraventricular tachycardia) (HCC)   2. Other hyperlipidemia   3. Essential hypertension   4. Moderate persistent reactive airway disease without complication      PLAN:  In order of problems listed above:  1. No palpitations or sustained arrhythmia. 2. Followed by primary care. No current therapy. 3. Elevated. Has been on sinus medication that may be having an impact. She has had her medications this morning. No changes made today other than to ask more strict observation of low salt intake. She will record at least one blood pressure reading per week over the next 4-6 weeks. If we remain above 140/90 mmHg, additional adjustment in therapy will be needed. She will call with results. 4. Not addressed.    Medication Adjustments/Labs and Tests Ordered: Current medicines are reviewed at length with the patient today.  Concerns regarding medicines are outlined above.  Medication  changes, Labs and Tests ordered today are listed in the Patient Instructions below. There are no Patient Instructions on file for this visit.   Signed, Sinclair Grooms, MD  09/02/2016 9:31 AM    [facility] [address], [place], [place]  [address] Phone: [phone]; Fax: [phone]

## 2016-09-10 ENCOUNTER — Encounter: Payer: Self-pay | Admitting: Internal Medicine

## 2016-09-10 ENCOUNTER — Ambulatory Visit (INDEPENDENT_AMBULATORY_CARE_PROVIDER_SITE_OTHER)
Admission: RE | Admit: 2016-09-10 | Discharge: 2016-09-10 | Disposition: A | Discharge status: Home / Self Care | Payer: Medicare Other | Source: Ambulatory Visit | Attending: Internal Medicine | Admitting: Internal Medicine

## 2016-09-10 ENCOUNTER — Ambulatory Visit (INDEPENDENT_AMBULATORY_CARE_PROVIDER_SITE_OTHER): Payer: Medicare Other | Admitting: Internal Medicine

## 2016-09-10 VITALS — BP 160/98 | HR 68 | Temp 98.2°F | Resp 16 | Ht 63.0 in | Wt 203.0 lb

## 2016-09-10 DIAGNOSIS — R05 Cough: Secondary | ICD-10-CM | POA: Diagnosis not present

## 2016-09-10 DIAGNOSIS — J4520 Mild intermittent asthma, uncomplicated: Secondary | ICD-10-CM | POA: Diagnosis not present

## 2016-09-10 DIAGNOSIS — R059 Cough, unspecified: Secondary | ICD-10-CM

## 2016-09-10 DIAGNOSIS — J988 Other specified respiratory disorders: Secondary | ICD-10-CM | POA: Diagnosis not present

## 2016-09-10 DIAGNOSIS — F321 Major depressive disorder, single episode, moderate: Secondary | ICD-10-CM | POA: Insufficient documentation

## 2016-09-10 DIAGNOSIS — I10 Essential (primary) hypertension: Secondary | ICD-10-CM | POA: Diagnosis not present

## 2016-09-10 MED ORDER — PROMETHAZINE-DM 6.25-15 MG/5ML PO SYRP: 5.0000 mL | ORAL_SOLUTION | Freq: Four times a day (QID) | ORAL | 0 refills | Status: DC | PRN

## 2016-09-10 MED ORDER — MOMETASONE FURO-FORMOTEROL FUM 100-5 MCG/ACT IN AERO: 2.0000 | INHALATION_SPRAY | Freq: Two times a day (BID) | RESPIRATORY_TRACT | 11 refills | Status: DC

## 2016-09-10 MED ORDER — CEFDINIR 300 MG PO CAPS: 300.0000 mg | ORAL_CAPSULE | Freq: Two times a day (BID) | ORAL | 1 refills | Status: DC

## 2016-09-10 MED ORDER — DULOXETINE HCL 30 MG PO CPEP: 30.0000 mg | ORAL_CAPSULE | Freq: Every day | ORAL | 1 refills | Status: DC

## 2016-09-10 NOTE — Progress Notes (Signed)
Pre visit review using our clinic review tool, if applicable. No additional management support is needed unless otherwise documented below in the visit note. 

## 2016-09-10 NOTE — Assessment & Plan Note (Signed)
Her blood pressure is not well controlled She was not willing to change her antihypertensives today Tells me that she will see her cardiologist in about 3 weeks for evaluation and treatment of the elevated blood pressure

## 2016-09-10 NOTE — Progress Notes (Signed)
Subjective:  Patient ID: Deborah [name], female    DOB: [birth year]  Age: 70 y.o. MRN: [id number]  CC: Cough; Asthma; and Depression   HPI Deborah [name] presents for A one-month history of cough that's productive of thick yellow/green phlegm with intermittent wheezing and shortness of breath. She has a history of asthma but has not recently been treating it. She denies hemoptysis, weight loss, fever, chills, night sweats.  She also complains of depression. She describes difficulty falling asleep, weight gain, anhedonia, crying spells, ruminations, and feeling hopeless and helpless. She wants to start an antidepressant.  Outpatient Medications Prior to Visit  Medication Sig Dispense Refill  . albuterol (PROVENTIL HFA;VENTOLIN HFA) 108 (90 BASE) MCG/ACT inhaler Inhale 2 puffs into the lungs every 4 (four) hours as needed for wheezing. 1 Inhaler 2  . losartan-hydrochlorothiazide (HYZAAR) 100-25 MG tablet Take 1 tablet by mouth daily. 90 tablet 3  . Nebivolol HCl (BYSTOLIC) 20 MG TABS Take 1 tablet (20 mg total) by mouth daily. 90 tablet 3   No facility-administered medications prior to visit.     ROS Review of Systems  Constitutional: Positive for unexpected weight change. Negative for chills, fatigue and fever.  HENT: Negative.  Negative for facial swelling, sore throat and trouble swallowing.   Eyes: Negative.  Negative for visual disturbance.  Respiratory: Positive for cough, shortness of breath and wheezing.   Cardiovascular: Negative for chest pain, palpitations and leg swelling.  Gastrointestinal: Negative for abdominal pain, constipation, diarrhea, nausea and vomiting.  Endocrine: Negative.   Genitourinary: Negative.   Musculoskeletal: Negative.  Negative for arthralgias, back pain, joint swelling, myalgias and neck pain.  Skin: Negative.  Negative for color change and rash.  Allergic/Immunologic: Negative.   Neurological: Negative.   Hematological: Negative.   Negative for adenopathy. Does not bruise/bleed easily.  Psychiatric/Behavioral: Positive for dysphoric mood and sleep disturbance. Negative for behavioral problems, confusion and suicidal ideas. The patient is not nervous/anxious and is not hyperactive.     Objective:  BP (!) 160/98 (BP Location: Left Arm, Patient Position: Sitting, Cuff Size: Normal)   Pulse 68   Temp 98.2 F (36.8 C) (Oral)   Resp 16   Ht 5\' 3"  (1.6 m)   Wt 203 lb 0.1 oz (92.1 kg)   SpO2 97%   BMI 35.96 kg/m   BP Readings from Last 3 Encounters:  09/10/16 (!) 160/98  09/02/16 (!) 158/94  03/05/16 (!) 141/90    Wt Readings from Last 3 Encounters:  09/10/16 203 lb 0.1 oz (92.1 kg)  09/02/16 202 lb 9.6 oz (91.9 kg)  03/05/16 197 lb (89.4 kg)    Physical Exam  Constitutional: She is oriented to person, place, and time. No distress.  HENT:  Nose: Nose normal.  Mouth/Throat: Oropharynx is clear and moist. No oropharyngeal exudate.  Eyes: Conjunctivae are normal. Right eye exhibits no discharge. Left eye exhibits no discharge. No scleral icterus.  Neck: Normal range of motion. Neck supple. No JVD present. No tracheal deviation present. No thyromegaly present.  Cardiovascular: Normal rate, regular rhythm, normal heart sounds and intact distal pulses.  Exam reveals no gallop and no friction rub.   No murmur heard. Pulmonary/Chest: Effort normal and breath sounds normal. No stridor. No respiratory distress. She has no wheezes. She has no rales. She exhibits no tenderness.  Abdominal: Soft. Bowel sounds are normal. She exhibits no distension and no mass. There is no tenderness. There is no rebound and no guarding.  Musculoskeletal: Normal range of  motion. She exhibits no edema, tenderness or deformity.  Lymphadenopathy:    She has no cervical adenopathy.  Neurological: She is oriented to person, place, and time.  Skin: Skin is warm and dry. No rash noted. She is not diaphoretic. No erythema. No pallor.    Psychiatric: Her behavior is normal. Judgment and thought content normal. Her mood appears anxious. Her affect is not angry. Her speech is not rapid and/or pressured, not delayed and not tangential. She is not slowed and not withdrawn. Cognition and memory are normal. She exhibits a depressed mood. She expresses no homicidal and no suicidal ideation. She expresses no suicidal plans and no homicidal plans.  She is sad and tearful  She is attentive.  Vitals reviewed.   Lab Results  Component Value Date   WBC 9.1 01/24/2016   HGB 16.0 (H) 01/24/2016   HCT 47.1 (H) 01/24/2016   PLT 314.0 01/24/2016   GLUCOSE 117 (H) 01/24/2016   CHOL 194 01/02/2016   TRIG 92.0 01/02/2016   HDL 55.10 01/02/2016   LDLDIRECT 147.1 07/30/2012   LDLCALC 120 (H) 01/02/2016   ALT 27 01/24/2016   AST 18 01/24/2016   NA 142 01/24/2016   K 4.0 01/24/2016   CL 104 01/24/2016   CREATININE 0.95 01/24/2016   BUN 20 01/24/2016   CO2 31 01/24/2016   TSH 2.82 01/02/2016   HGBA1C 5.6 01/02/2016    Mm Screening Breast Tomo Bilateral  Result Date: 04/11/2016 CLINICAL DATA:  Screening. EXAM: 2D DIGITAL SCREENING BILATERAL MAMMOGRAM WITH CAD AND ADJUNCT TOMO COMPARISON:  Previous exam(s). ACR Breast Density Category b: There are scattered areas of fibroglandular density. FINDINGS: There are no findings suspicious for malignancy. Images were processed with CAD. IMPRESSION: No mammographic evidence of malignancy. A result letter of this screening mammogram will be mailed directly to the patient. RECOMMENDATION: Screening mammogram in one year. (Code:[id number]) BI-RADS CATEGORY  1: Negative. Electronically Signed   By: Lajean Manes M.D.   On: 04/11/2016 10:51    Assessment & Plan:   [name] was seen today for cough, asthma and depression.  Diagnoses and all orders for this visit:  Mild intermittent asthma without complication- she has poorly controlled symptoms so I have asked her to start an ICS,/LABA combination. I  gave her a sample of Dulera and showed her how to use it. She demonstrated proficiency with its use. -     mometasone-formoterol (DULERA) 100-5 MCG/ACT AERO; Inhale 2 puffs into the lungs 2 (two) times daily.  Moderate single current episode of major depressive disorder (HCC) -     DULoxetine (CYMBALTA) 30 MG capsule; Take 1 capsule (30 mg total) by mouth daily.  Cough- her chest x-ray is normal. Will treat the asthma and the respiratory infection. Will also offer promethazine with dextromethorphan to control the cough. -     DG Chest 2 View; Future -     promethazine-dextromethorphan (PROMETHAZINE-DM) 6.25-15 MG/5ML syrup; Take 5 mLs by mouth 4 (four) times daily as needed for cough.  RTI (respiratory tract infection) -     cefdinir (OMNICEF) 300 MG capsule; Take 1 capsule (300 mg total) by mouth 2 (two) times daily. -     promethazine-dextromethorphan (PROMETHAZINE-DM) 6.25-15 MG/5ML syrup; Take 5 mLs by mouth 4 (four) times daily as needed for cough.   I am having Ms. Reaume start on mometasone-formoterol, DULoxetine, cefdinir, and promethazine-dextromethorphan. I am also having her maintain her albuterol, losartan-hydrochlorothiazide, and Nebivolol HCl.  Meds ordered this encounter  Medications  .  mometasone-formoterol (DULERA) 100-5 MCG/ACT AERO    Sig: Inhale 2 puffs into the lungs 2 (two) times daily.    Dispense:  13 g    Refill:  11  . DULoxetine (CYMBALTA) 30 MG capsule    Sig: Take 1 capsule (30 mg total) by mouth daily.    Dispense:  30 capsule    Refill:  1  . cefdinir (OMNICEF) 300 MG capsule    Sig: Take 1 capsule (300 mg total) by mouth 2 (two) times daily.    Dispense:  20 capsule    Refill:  1  . promethazine-dextromethorphan (PROMETHAZINE-DM) 6.25-15 MG/5ML syrup    Sig: Take 5 mLs by mouth 4 (four) times daily as needed for cough.    Dispense:  118 mL    Refill:  0     Follow-up: Return in about 3 weeks (around 10/01/2016).  Scarlette Calico, MD

## 2016-09-10 NOTE — Patient Instructions (Signed)
Cough, Adult Coughing is a reflex that clears your throat and your airways. Coughing helps to heal and protect your lungs. It is normal to cough occasionally, but a cough that happens with other symptoms or lasts a long time may be a sign of a condition that needs treatment. A cough may last only 2-3 weeks (acute), or it may last longer than 8 weeks (chronic). What are the causes? Coughing is commonly caused by:  Breathing in substances that irritate your lungs.  A viral or bacterial respiratory infection.  Allergies.  Asthma.  Postnasal drip.  Smoking.  Acid backing up from the stomach into the esophagus (gastroesophageal reflux).  Certain medicines.  Chronic lung problems, including COPD (or rarely, lung cancer).  Other medical conditions such as heart failure.  Follow these instructions at home: Pay attention to any changes in your symptoms. Take these actions to help with your discomfort:  Take medicines only as told by your health care provider. ? If you were prescribed an antibiotic medicine, take it as told by your health care provider. Do not stop taking the antibiotic even if you start to feel better. ? Talk with your health care provider before you take a cough suppressant medicine.  Drink enough fluid to keep your urine clear or pale yellow.  If the air is dry, use a cold steam vaporizer or humidifier in your bedroom or your home to help loosen secretions.  Avoid anything that causes you to cough at work or at home.  If your cough is worse at night, try sleeping in a semi-upright position.  Avoid cigarette smoke. If you smoke, quit smoking. If you need help quitting, ask your health care provider.  Avoid caffeine.  Avoid alcohol.  Rest as needed.  Contact a health care provider if:  You have new symptoms.  You cough up pus.  Your cough does not get better after 2-3 weeks, or your cough gets worse.  You cannot control your cough with suppressant  medicines and you are losing sleep.  You develop pain that is getting worse or pain that is not controlled with pain medicines.  You have a fever.  You have unexplained weight loss.  You have night sweats. Get help right away if:  You cough up blood.  You have difficulty breathing.  Your heartbeat is very fast. This information is not intended to replace advice given to you by your health care provider. Make sure you discuss any questions you have with your health care provider. Document Released: 03/29/2011 Document Revised: 03/07/2016 Document Reviewed: 12/07/2014 Elsevier Interactive Patient Education  2017 Elsevier Inc.  

## 2016-09-11 ENCOUNTER — Telehealth: Payer: Self-pay

## 2016-09-11 ENCOUNTER — Other Ambulatory Visit: Payer: Self-pay | Admitting: Internal Medicine

## 2016-09-11 ENCOUNTER — Encounter: Payer: Self-pay | Admitting: Internal Medicine

## 2016-09-11 NOTE — Telephone Encounter (Signed)
PA initiated and APPROVED via CoverMyMeds key [id number]

## 2016-10-20 ENCOUNTER — Encounter: Payer: Self-pay | Admitting: Internal Medicine

## 2016-10-20 LAB — COLOGUARD

## 2016-10-21 ENCOUNTER — Telehealth: Payer: Self-pay | Admitting: Interventional Cardiology

## 2016-10-21 NOTE — Telephone Encounter (Signed)
These blood pressures are acceptable under the circumstances. 2 g sodium diet. Aerobic activity. Weight loss would be helpful. No change in medical regimen.

## 2016-10-21 NOTE — Telephone Encounter (Signed)
°  New Prob  Pt c/o BP issue: STAT if pt c/o blurred vision, one-sided weakness or slurred speech  1. What are your last 5 BP readings?  143/72 (most recent- Yesterday). States systolic has been in high 140's in the last few days with diastolic readings no higher than 78.  2. Are you having any other symptoms (ex. Dizziness, headache, blurred vision, passed out)?  Headaches. However, pt attributes this to the weather.  3. What is your BP issue?  States she was advised to call and give update on her blood pressure readings.

## 2016-10-21 NOTE — Telephone Encounter (Signed)
Pt states BP has been running low 140's/70's, HR 60's.  Denies any HTN symptoms. Occasionally has ringing in her ears.  Pt states she spends most of the day running around after a child so she thinks that's why her BP is usually in the 140's.  States she can sit down and rest and it will come down into the high 130's.  Advised I will send message to Dr. Tamala Julian for review.

## 2016-10-22 NOTE — Telephone Encounter (Signed)
Informed pt of Dr. Thompson Caul recommendations.  Pt starting Keto diet today.  Advised to continue monitoring BP and let us know if it gets higher or if it starts to get too low as she loses weight.  Pt verbalized understanding.

## 2016-11-01 ENCOUNTER — Encounter: Payer: Self-pay | Admitting: Sports Medicine

## 2016-11-01 ENCOUNTER — Ambulatory Visit (INDEPENDENT_AMBULATORY_CARE_PROVIDER_SITE_OTHER): Payer: Medicare Other | Admitting: Sports Medicine

## 2016-11-01 ENCOUNTER — Other Ambulatory Visit: Payer: Medicare Other

## 2016-11-01 VITALS — BP 132/78 | HR 72 | Temp 98.3°F | Resp 16 | Ht 62.5 in | Wt 199.4 lb

## 2016-11-01 DIAGNOSIS — R05 Cough: Secondary | ICD-10-CM | POA: Diagnosis not present

## 2016-11-01 DIAGNOSIS — J988 Other specified respiratory disorders: Secondary | ICD-10-CM | POA: Diagnosis not present

## 2016-11-01 DIAGNOSIS — N3000 Acute cystitis without hematuria: Secondary | ICD-10-CM

## 2016-11-01 DIAGNOSIS — N393 Stress incontinence (female) (male): Secondary | ICD-10-CM | POA: Diagnosis not present

## 2016-11-01 DIAGNOSIS — R059 Cough, unspecified: Secondary | ICD-10-CM

## 2016-11-01 LAB — POCT URINALYSIS DIPSTICK
GLUCOSE UA: NEGATIVE
Ketones, UA: NEGATIVE
NITRITE UA: POSITIVE
SPEC GRAV UA: 1.02
UROBILINOGEN UA: 2
pH, UA: 6

## 2016-11-01 MED ORDER — ALBUTEROL SULFATE HFA 108 (90 BASE) MCG/ACT IN AERS: 2.0000 | INHALATION_SPRAY | RESPIRATORY_TRACT | 2 refills | Status: DC | PRN

## 2016-11-01 MED ORDER — ALBUTEROL SULFATE HFA 108 (90 BASE) MCG/ACT IN AERS: 2.0000 | INHALATION_SPRAY | RESPIRATORY_TRACT | 2 refills | Status: AC | PRN

## 2016-11-01 MED ORDER — PHENAZOPYRIDINE HCL 200 MG PO TABS: 200.0000 mg | ORAL_TABLET | Freq: Three times a day (TID) | ORAL | 0 refills | Status: DC | PRN

## 2016-11-01 MED ORDER — CEFDINIR 300 MG PO CAPS: 300.0000 mg | ORAL_CAPSULE | Freq: Two times a day (BID) | ORAL | 0 refills | Status: DC

## 2016-11-01 NOTE — Patient Instructions (Signed)
Cough, Adult Introduction A cough helps to clear your throat and lungs. A cough may last only 2-3 weeks (acute), or it may last longer than 8 weeks (chronic). Many different things can cause a cough. A cough may be a sign of an illness or another medical condition. Follow these instructions at home:  Pay attention to any changes in your cough.  Take medicines only as told by your doctor.  If you were prescribed an antibiotic medicine, take it as told by your doctor. Do not stop taking it even if you start to feel better.  Talk with your doctor before you try using a cough medicine.  Drink enough fluid to keep your pee (urine) clear or pale yellow.  If the air is dry, use a cold steam vaporizer or humidifier in your home.  Stay away from things that make you cough at work or at home.  If your cough is worse at night, try using extra pillows to raise your head up higher while you sleep.  Do not smoke, and try not to be around smoke. If you need help quitting, ask your doctor.  Do not have caffeine.  Do not drink alcohol.  Rest as needed. Contact a doctor if:  You have new problems (symptoms).  You cough up yellow fluid (pus).  Your cough does not get better after 2-3 weeks, or your cough gets worse.  Medicine does not help your cough and you are not sleeping well.  You have pain that gets worse or pain that is not helped with medicine.  You have a fever.  You are losing weight and you do not know why.  You have night sweats. Get help right away if:  You cough up blood.  You have trouble breathing.  Your heartbeat is very fast. This information is not intended to replace advice given to you by your health care provider. Make sure you discuss any questions you have with your health care provider. Document Released: 06/13/2011 Document Revised: 03/07/2016 Document Reviewed: 12/07/2014  2017 Elsevier   Urinary Tract Infection, Adult Introduction A urinary tract  infection (UTI) is an infection of any part of the urinary tract. The urinary tract includes the:  Kidneys.  Ureters.  Bladder.  Urethra. These organs make, store, and get rid of pee (urine) in the body. Follow these instructions at home:  Take over-the-counter and prescription medicines only as told by your doctor.  If you were prescribed an antibiotic medicine, take it as told by your doctor. Do not stop taking the antibiotic even if you start to feel better.  Avoid the following drinks:  Alcohol.  Caffeine.  Tea.  Carbonated drinks.  Drink enough fluid to keep your pee clear or pale yellow.  Keep all follow-up visits as told by your doctor. This is important.  Make sure to:  Empty your bladder often and completely. Do not to hold pee for long periods of time.  Empty your bladder before and after sex.  Wipe from front to back after a bowel movement if you are female. Use each tissue one time when you wipe. Contact a doctor if:  You have back pain.  You have a fever.  You feel sick to your stomach (nauseous).  You throw up (vomit).  Your symptoms do not get better after 3 days.  Your symptoms go away and then come back. Get help right away if:  You have very bad back pain.  You have very bad lower belly (  abdominal) pain.  You are throwing up and cannot keep down any medicines or water. This information is not intended to replace advice given to you by your health care provider. Make sure you discuss any questions you have with your health care provider. Document Released: 03/18/2008 Document Revised: 03/07/2016 Document Reviewed: 08/21/2015  2017 Elsevier

## 2016-11-01 NOTE — Progress Notes (Signed)
Pre visit review using our clinic review tool, if applicable. No additional management support is needed unless otherwise documented below in the visit note. 

## 2016-11-01 NOTE — Progress Notes (Signed)
Deborah [name] - 71 y.o. female MRN [id number]  Date of birth: May 17, 1946  Office Visit Note: Visit Date: 11/01/2016 PCP: Binnie Rail, MD Referred by: Binnie Rail, MD  Subjective: Chief Complaint  Patient presents with  . Acute Visit    Cough x 10 days, UTI x 3 days, blood in sputum x 5 days   HPI: 10 days of worsening cough with blood-tinged sputum over the past 7 days.  Minimal shortness of breath and dyspnea on exertion.  No chest pain.  She additionally developed urinary frequency hesitancy and dysuria over the past 3 days that are consistent with prior urinary tract infections.  She denies any fevers, chills at this time did have a fever of 100F last week.  She reports her urinary incontinence from stress incontinence has worsened as well.  She is having to wear a pad.  She has tried taking peridium.  She is using NyQuil.  She has only been using her albuterol infrequently.  And reports overall good improvement with the [facility] she was recently prescribed.  ROS: Otherwise per HPI.   Clinical History: No specialty comments available.  She reports that she quit smoking about 47 years ago. She has never used smokeless tobacco.   Recent Labs  01/02/16 0923  HGBA1C 5.6    Assessment & Plan: Visit Diagnoses:    ICD-9-CM ICD-10-CM   1. Acute cystitis without hematuria 595.0 N30.00 POCT urinalysis dipstick     CULTURE, URINE COMPREHENSIVE  2. RTI (respiratory tract infection) 519.8 J98.8 cefdinir (OMNICEF) 300 MG capsule  3. Cough 786.2 R05   4. Stress incontinence of urine IMO0002 N39.3     Plan: Meds as below. Will send for urine culture. Has fluconazole for post ABX yeast vaginitis.  Continue Dulera Follow-up: Return if symptoms worsen or fail to improve.  Meds:  Meds ordered this encounter  Medications  . cefdinir (OMNICEF) 300 MG capsule    Sig: Take 1 capsule (300 mg total) by mouth 2 (two) times daily.    Dispense:  14 capsule    Refill:  0  .  phenazopyridine (PYRIDIUM) 200 MG tablet    Sig: Take 1 tablet (200 mg total) by mouth 3 (three) times daily as needed for pain.    Dispense:  10 tablet    Refill:  0  . DISCONTD: albuterol (PROVENTIL HFA;VENTOLIN HFA) 108 (90 Base) MCG/ACT inhaler    Sig: Inhale 2 puffs into the lungs every 4 (four) hours as needed for wheezing.    Dispense:  1 Inhaler    Refill:  2  . albuterol (PROVENTIL HFA;VENTOLIN HFA) 108 (90 Base) MCG/ACT inhaler    Sig: Inhale 2 puffs into the lungs every 4 (four) hours as needed for wheezing.    Dispense:  1 Inhaler    Refill:  2   Procedures: No notes on file   Objective:  VS:  HT:5' 2.5" (158.8 cm)   WT:199 lb 6.4 oz (90.4 kg)  BMI:36    BP:132/78  HR:72bpm  TEMP:98.3 F (36.8 C)(Oral)  RESP:97 % Physical Exam:  Adult female.  In no acute distress.  Alert and appropriate.  Nontoxic appearing.  Lungs are clear to auscultation.  Heart demonstrates a regular rate and rhythm.  No murmur.  Abdomen is soft and nontender.  She is mildly tender over the suprapubic region.  Results for orders placed or performed in visit on 11/01/16 (from the past 24 hour(s))  POCT urinalysis dipstick  Status: Abnormal   Collection Time: 11/01/16 10:32 AM  Result Value Ref Range   Color, UA orange    Clarity, UA cloudy    Glucose, UA neg    Bilirubin, UA 2+    Ketones, UA neg    Spec Grav, UA 1.020    Blood, UA 1+    pH, UA 6.0    Protein, UA +-25    Urobilinogen, UA 2.0    Nitrite, UA pos    Leukocytes, UA large (3+) (A) Negative     Past Medical/Family/Surgical/Social History: Medications & Allergies reviewed per EMR Patient Active Problem List   Diagnosis Date Noted  . Acute cystitis without hematuria 11/01/2016  . Moderate single current episode of major depressive disorder ([place]) 09/10/2016  . Cough 09/10/2016  . RTI (respiratory tract infection) 09/10/2016  . Diarrhea 01/24/2016  . SVT (supraventricular tachycardia) ([place]) 03/17/2014  . Nonspecific  elevation of levels of transaminase or lactic acid dehydrogenase (LDH) 10/31/2013  . Asthma 07/29/2012  . Hyperlipidemia 07/19/2008  . Essential hypertension 07/19/2008  . DVT, HX OF 07/19/2008   Past Medical History:  Diagnosis Date  . Clotting disorder (HCC)    DVT  . 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 GERD (gastroesophageal reflux disease)   . Hyperlipidemia   . Hypertension   . Ocular migraine   . Pneumonia   . UTI (urinary tract infection) 10/10/13   E coli 70,000 colonies   Family History  Problem Relation Age of Onset  . Arthritis Mother     OA  . Melanoma Mother 46    in situ  . Transient ischemic attack Mother     late 23s  . COPD Mother   . Cancer Father     renal cancer  . Diabetes Father   . Heart attack Father     in 34s  . Hypertension Father   . Asthma Father   . Hypertension Sister   . Asthma Sister   . Kidney disease Sister     ? from Celebrex  . Kidney failure Sister   . Breast cancer Sister   . Diabetes Brother   . Emphysema Paternal Grandfather   . Stroke Paternal Grandfather     in 42s  . Stroke Paternal Grandmother     in 4s  . Diabetes Paternal Grandmother   . Hypertension Paternal Grandmother   . Other Sister     Chron's or UC  . Colon polyps Neg Hx   . Esophageal cancer Neg Hx   . Pancreatic cancer Neg Hx   . Rectal cancer Neg Hx   . Stomach cancer Neg Hx    Past Surgical History:  Procedure Laterality Date  . DVT     2-3 X ; on BCP with first  . ENDOVENOUS ABLATION SAPHENOUS VEIN W/ LASER  [id number]   complicated by DVT   . ENDOVENOUS ABLATION SAPHENOUS VEIN W/ LASER  2013   Dr Renaldo Reel  . no colonoscopy     SOC reviewed  . TONSILLECTOMY    . TUBAL LIGATION     G3 P3  . WISDOM TOOTH EXTRACTION     Social History   Occupational History  . retired Therapist, sports    Social History Main Topics  . Smoking status: Former Smoker    Quit date: 10/14/1969  . Smokeless tobacco: Never Used     Comment: Quit at age 4 (smoked  x 2 years, up to 1 ppd)   .  Alcohol use 1.2 oz/week    2 Glasses of wine per week     Comment: Red wine (2 glasses weekly or less)   . Drug use: No  . Sexual activity: Not on file

## 2016-11-03 LAB — CULTURE, URINE COMPREHENSIVE

## 2016-11-05 NOTE — Progress Notes (Unsigned)
Called patient with result informing her that sensitivities returned and antibiotic was appropriate.  She is feeling better.  Follow up PRN.

## 2016-11-11 ENCOUNTER — Encounter: Payer: Self-pay | Admitting: Interventional Cardiology

## 2016-11-15 ENCOUNTER — Telehealth: Payer: Self-pay | Admitting: Interventional Cardiology

## 2016-11-15 DIAGNOSIS — I517 Cardiomegaly: Secondary | ICD-10-CM

## 2016-11-15 DIAGNOSIS — I10 Essential (primary) hypertension: Secondary | ICD-10-CM

## 2016-11-15 NOTE — Telephone Encounter (Signed)
New message       Pt said Dr Tamala Julian want her to have an echo.  If so, please put in order and we will call pt to schedule it.  If no echo is ordered, please call pt

## 2016-11-15 NOTE — Telephone Encounter (Signed)
What diagnoses would you like me to use for echo?

## 2016-11-16 NOTE — Telephone Encounter (Signed)
Cardiomegaly on CXR Hypertensive heart disease.

## 2016-11-18 ENCOUNTER — Ambulatory Visit (INDEPENDENT_AMBULATORY_CARE_PROVIDER_SITE_OTHER): Payer: Medicare Other | Admitting: Internal Medicine

## 2016-11-18 ENCOUNTER — Encounter: Payer: Self-pay | Admitting: Internal Medicine

## 2016-11-18 VITALS — BP 136/86 | HR 79 | Temp 98.5°F | Resp 16 | Ht 63.0 in | Wt 198.0 lb

## 2016-11-18 DIAGNOSIS — Z13820 Encounter for screening for osteoporosis: Secondary | ICD-10-CM

## 2016-11-18 DIAGNOSIS — E7849 Other hyperlipidemia: Secondary | ICD-10-CM

## 2016-11-18 DIAGNOSIS — Z0000 Encounter for general adult medical examination without abnormal findings: Secondary | ICD-10-CM | POA: Diagnosis not present

## 2016-11-18 DIAGNOSIS — E784 Other hyperlipidemia: Secondary | ICD-10-CM

## 2016-11-18 DIAGNOSIS — Z1159 Encounter for screening for other viral diseases: Secondary | ICD-10-CM

## 2016-11-18 DIAGNOSIS — I10 Essential (primary) hypertension: Secondary | ICD-10-CM

## 2016-11-18 DIAGNOSIS — F321 Major depressive disorder, single episode, moderate: Secondary | ICD-10-CM

## 2016-11-18 NOTE — Assessment & Plan Note (Signed)
Still has some intermittent depression/anxiety - dealing with it for now No new medication

## 2016-11-18 NOTE — Progress Notes (Signed)
Pre visit review using our clinic review tool, if applicable. No additional management support is needed unless otherwise documented below in the visit note. 

## 2016-11-18 NOTE — Patient Instructions (Addendum)
Test(s) ordered today. Your results will be released to [place] (or called to you) after review, usually within 72hours after test completion. If any changes need to be made, you will be notified at that same time.  All other Health Maintenance issues reviewed.   All recommended immunizations and age-appropriate screenings are up-to-date or discussed.  No immunizations administered today.   Medications reviewed and updated.  No changes recommended at this time.  Your prescription(s) have been submitted to your pharmacy. Please take as directed and contact our office if you believe you are having problem(s) with the medication(s).  A bone density was ordered  Please followup in one year for a physical    Health Maintenance, Female Introduction Adopting a healthy lifestyle and getting preventive care can go a long way to promote health and wellness. Talk with your health care provider about what schedule of regular examinations is right for you. This is a good chance for you to check in with your provider about disease prevention and staying healthy. In between checkups, there are plenty of things you can do on your own. Experts have done a lot of research about which lifestyle changes and preventive measures are most likely to keep you healthy. Ask your health care provider for more information. Weight and diet Eat a healthy diet  Be sure to include plenty of vegetables, fruits, low-fat dairy products, and lean protein.  Do not eat a lot of foods high in solid fats, added sugars, or salt.  Get regular exercise. This is one of the most important things you can do for your health.  Most adults should exercise for at least 150 minutes each week. The exercise should increase your heart rate and make you sweat (moderate-intensity exercise).  Most adults should also do strengthening exercises at least twice a week. This is in addition to the moderate-intensity exercise. Maintain a healthy  weight  Body mass index (BMI) is a measurement that can be used to identify possible weight problems. It estimates body fat based on height and weight. Your health care provider can help determine your BMI and help you achieve or maintain a healthy weight.  For females 61 years of age and older:  A BMI below 18.5 is considered underweight.  A BMI of 18.5 to 24.9 is normal.  A BMI of 25 to 29.9 is considered overweight.  A BMI of 30 and above is considered obese. Watch levels of cholesterol and blood lipids  You should start having your blood tested for lipids and cholesterol at 70 years of age, then have this test every 5 years.  You may need to have your cholesterol levels checked more often if:  Your lipid or cholesterol levels are high.  You are older than 70 years of age.  You are at high risk for heart disease. Cancer screening Lung Cancer  Lung cancer screening is recommended for adults 10-71 years old who are at high risk for lung cancer because of a history of smoking.  A yearly low-dose CT scan of the lungs is recommended for people who:  Currently smoke.  Have quit within the past 15 years.  Have at least a 30-pack-year history of smoking. A pack year is smoking an average of one pack of cigarettes a day for 1 year.  Yearly screening should continue until it has been 15 years since you quit.  Yearly screening should stop if you develop a health problem that would prevent you from having lung cancer treatment.  Breast Cancer  Practice breast self-awareness. This means understanding how your breasts normally appear and feel.  It also means doing regular breast self-exams. Let your health care provider know about any changes, no matter how small.  If you are in your 20s or 30s, you should have a clinical breast exam (CBE) by a health care provider every 1-3 years as part of a regular health exam.  If you are 5 or older, have a CBE every year. Also consider  having a breast X-ray (mammogram) every year.  If you have a family history of breast cancer, talk to your health care provider about genetic screening.  If you are at high risk for breast cancer, talk to your health care provider about having an MRI and a mammogram every year.  Breast cancer gene (BRCA) assessment is recommended for women who have family members with BRCA-related cancers. BRCA-related cancers include:  Breast.  Ovarian.  Tubal.  Peritoneal cancers.  Results of the assessment will determine the need for genetic counseling and BRCA1 and BRCA2 testing. Cervical Cancer  Your health care provider may recommend that you be screened regularly for cancer of the pelvic organs (ovaries, uterus, and vagina). This screening involves a pelvic examination, including checking for microscopic changes to the surface of your cervix (Pap test). You may be encouraged to have this screening done every 3 years, beginning at age 103.  For women ages 74-65, health care providers may recommend pelvic exams and Pap testing every 3 years, or they may recommend the Pap and pelvic exam, combined with testing for human papilloma virus (HPV), every 5 years. Some types of HPV increase your risk of cervical cancer. Testing for HPV may also be done on women of any age with unclear Pap test results.  Other health care providers may not recommend any screening for nonpregnant women who are considered low risk for pelvic cancer and who do not have symptoms. Ask your health care provider if a screening pelvic exam is right for you.  If you have had past treatment for cervical cancer or a condition that could lead to cancer, you need Pap tests and screening for cancer for at least 20 years after your treatment. If Pap tests have been discontinued, your risk factors (such as having a new sexual partner) need to be reassessed to determine if screening should resume. Some women have medical problems that increase the  chance of getting cervical cancer. In these cases, your health care provider may recommend more frequent screening and Pap tests. Colorectal Cancer  This type of cancer can be detected and often prevented.  Routine colorectal cancer screening usually begins at 70 years of age and continues through 70 years of age.  Your health care provider may recommend screening at an earlier age if you have risk factors for colon cancer.  Your health care provider may also recommend using home test kits to check for hidden blood in the stool.  A small camera at the end of a tube can be used to examine your colon directly (sigmoidoscopy or colonoscopy). This is done to check for the earliest forms of colorectal cancer.  Routine screening usually begins at age 66.  Direct examination of the colon should be repeated every 5-10 years through 70 years of age. However, you may need to be screened more often if early forms of precancerous polyps or small growths are found. Skin Cancer  Check your skin from head to toe regularly.  Tell your health  care provider about any new moles or changes in moles, especially if there is a change in a mole's shape or color.  Also tell your health care provider if you have a mole that is larger than the size of a pencil eraser.  Always use sunscreen. Apply sunscreen liberally and repeatedly throughout the day.  Protect yourself by wearing long sleeves, pants, a wide-brimmed hat, and sunglasses whenever you are outside. Heart disease, diabetes, and high blood pressure  High blood pressure causes heart disease and increases the risk of stroke. High blood pressure is more likely to develop in:  People who have blood pressure in the high end of the normal range (130-139/85-89 mm Hg).  People who are overweight or obese.  People who are African American.  If you are 75-43 years of age, have your blood pressure checked every 3-5 years. If you are 45 years of age or older,  have your blood pressure checked every year. You should have your blood pressure measured twice-once when you are at a hospital or clinic, and once when you are not at a hospital or clinic. Record the average of the two measurements. To check your blood pressure when you are not at a hospital or clinic, you can use:  An automated blood pressure machine at a pharmacy.  A home blood pressure monitor.  If you are between 40 years and 72 years old, ask your health care provider if you should take aspirin to prevent strokes.  Have regular diabetes screenings. This involves taking a blood sample to check your fasting blood sugar level.  If you are at a normal weight and have a low risk for diabetes, have this test once every three years after 70 years of age.  If you are overweight and have a high risk for diabetes, consider being tested at a younger age or more often. Preventing infection Hepatitis B  If you have a higher risk for hepatitis B, you should be screened for this virus. You are considered at high risk for hepatitis B if:  You were born in a country where hepatitis B is common. Ask your health care provider which countries are considered high risk.  Your parents were born in a high-risk country, and you have not been immunized against hepatitis B (hepatitis B vaccine).  You have HIV or AIDS.  You use needles to inject street drugs.  You live with someone who has hepatitis B.  You have had sex with someone who has hepatitis B.  You get hemodialysis treatment.  You take certain medicines for conditions, including cancer, organ transplantation, and autoimmune conditions. Hepatitis C  Blood testing is recommended for:  Everyone born from [birth year] through 1964.  Anyone with known risk factors for hepatitis C. Sexually transmitted infections (STIs)  You should be screened for sexually transmitted infections (STIs) including gonorrhea and chlamydia if:  You are sexually active  and are younger than 70 years of age.  You are older than 70 years of age and your health care provider tells you that you are at risk for this type of infection.  Your sexual activity has changed since you were last screened and you are at an increased risk for chlamydia or gonorrhea. Ask your health care provider if you are at risk.  If you do not have HIV, but are at risk, it may be recommended that you take a prescription medicine daily to prevent HIV infection. This is called pre-exposure prophylaxis (PrEP). You are considered  at risk if:  You are sexually active and do not regularly use condoms or know the HIV status of your partner(s).  You take drugs by injection.  You are sexually active with a partner who has HIV. Talk with your health care provider about whether you are at high risk of being infected with HIV. If you choose to begin PrEP, you should first be tested for HIV. You should then be tested every 3 months for as long as you are taking PrEP. Pregnancy  If you are premenopausal and you may become pregnant, ask your health care provider about preconception counseling.  If you may become pregnant, take 400 to 800 micrograms (mcg) of folic acid every day.  If you want to prevent pregnancy, talk to your health care provider about birth control (contraception). Osteoporosis and menopause  Osteoporosis is a disease in which the bones lose minerals and strength with aging. This can result in serious bone fractures. Your risk for osteoporosis can be identified using a bone density scan.  If you are 54 years of age or older, or if you are at risk for osteoporosis and fractures, ask your health care provider if you should be screened.  Ask your health care provider whether you should take a calcium or vitamin D supplement to lower your risk for osteoporosis.  Menopause may have certain physical symptoms and risks.  Hormone replacement therapy may reduce some of these symptoms  and risks. Talk to your health care provider about whether hormone replacement therapy is right for you. Follow these instructions at home:  Schedule regular health, dental, and eye exams.  Stay current with your immunizations.  Do not use any tobacco products including cigarettes, chewing tobacco, or electronic cigarettes.  If you are pregnant, do not drink alcohol.  If you are breastfeeding, limit how much and how often you drink alcohol.  Limit alcohol intake to no more than 1 drink per day for nonpregnant women. One drink equals 12 ounces of beer, 5 ounces of wine, or 1 ounces of hard liquor.  Do not use street drugs.  Do not share needles.  Ask your health care provider for help if you need support or information about quitting drugs.  Tell your health care provider if you often feel depressed.  Tell your health care provider if you have ever been abused or do not feel safe at home. This information is not intended to replace advice given to you by your health care provider. Make sure you discuss any questions you have with your health care provider. Document Released: 04/15/2011 Document Revised: 03/07/2016 Document Reviewed: 07/04/2015  2017 [employer]

## 2016-11-18 NOTE — Progress Notes (Signed)
Subjective:    Patient ID: Deborah [name], female    DOB: 1946-02-18, 71 y.o.   MRN: [id number]  HPI She is here for a physical exam.   She started having PND, cough for the past 2-3 days.   Two weeks ago she changed her diet to a plant based diet, no dairy - mostly vegan.  She wants to feel better, control her sugars.  She had watched a documentary "what the health".   She has had increased stress with some depression.  Her daughter and two grandkids live with her and she helps care for them every day - they do not go to daycare.   Medications and allergies reviewed with patient and updated if appropriate.  Patient Active Problem List   Diagnosis Date Noted  . Acute cystitis without hematuria 11/01/2016  . Moderate single current episode of major depressive disorder ([place]) 09/10/2016  . Cough 09/10/2016  . Diarrhea 01/24/2016  . SVT (supraventricular tachycardia) ([place]) 03/17/2014  . Nonspecific elevation of levels of transaminase or lactic acid dehydrogenase (LDH) 10/31/2013  . Asthma 07/29/2012  . Hyperlipidemia 07/19/2008  . Essential hypertension 07/19/2008  . DVT, HX OF 07/19/2008    Current Outpatient Prescriptions on File Prior to Visit  Medication Sig Dispense Refill  . albuterol (PROVENTIL HFA;VENTOLIN HFA) 108 (90 Base) MCG/ACT inhaler Inhale 2 puffs into the lungs every 4 (four) hours as needed for wheezing. 1 Inhaler 2  . losartan-hydrochlorothiazide (HYZAAR) 100-25 MG tablet Take 1 tablet by mouth daily. 90 tablet 3  . mometasone-formoterol (DULERA) 100-5 MCG/ACT AERO Inhale 2 puffs into the lungs 2 (two) times daily. 13 g 11  . Nebivolol HCl (BYSTOLIC) 20 MG TABS Take 1 tablet (20 mg total) by mouth daily. 90 tablet 3   No current facility-administered medications on file prior to visit.     Past Medical History:  Diagnosis Date  . Clotting disorder (HCC)    DVT  . 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 GERD (gastroesophageal reflux  disease)   . Hyperlipidemia   . Hypertension   . Ocular migraine   . Pneumonia   . UTI (urinary tract infection) 10/10/13   E coli 70,000 colonies    Past Surgical History:  Procedure Laterality Date  . DVT     2-3 X ; on BCP with first  . ENDOVENOUS ABLATION SAPHENOUS VEIN W/ LASER  123456   complicated by DVT   . ENDOVENOUS ABLATION SAPHENOUS VEIN W/ LASER  2013   Dr Renaldo Reel  . no colonoscopy     SOC reviewed  . TONSILLECTOMY    . TUBAL LIGATION     G3 P3  . WISDOM TOOTH EXTRACTION      Social History   Social History  . Marital status: Married    Spouse name: N/A  . Number of children: 3  . Years of education: N/A   Occupational History  . retired Therapist, sports    Social History Main Topics  . Smoking status: Former Smoker    Quit date: 10/14/1969  . Smokeless tobacco: Never Used     Comment: Quit at age 89 (smoked x 2 years, up to 1 ppd)   . Alcohol use 1.2 oz/week    2 Glasses of wine per week     Comment: Red wine (2 glasses weekly or less)   . Drug use: No  . Sexual activity: Not Asked   Other Topics Concern  . None  Social History Narrative  . None    Family History  Problem Relation Age of Onset  . Arthritis Mother     OA  . Melanoma Mother 42    in situ  . Transient ischemic attack Mother     late 18s  . COPD Mother   . Cancer Father     renal cancer  . Diabetes Father   . Heart attack Father     in 26s  . Hypertension Father   . Asthma Father   . Hypertension Sister   . Asthma Sister   . Kidney disease Sister     ? from Celebrex  . Kidney failure Sister   . Breast cancer Sister   . Diabetes Brother   . Emphysema Paternal Grandfather   . Stroke Paternal Grandfather     in 19s  . Stroke Paternal Grandmother     in 11s  . Diabetes Paternal Grandmother   . Hypertension Paternal Grandmother   . Other Sister     Chron's or UC  . Colon polyps Neg Hx   . Esophageal cancer Neg Hx   . Pancreatic cancer Neg Hx   . Rectal cancer Neg Hx     . Stomach cancer Neg Hx     Review of Systems  Constitutional: Negative for chills and fever.  HENT: Positive for congestion, postnasal drip and sore throat. Negative for ear pain.   Respiratory: Positive for cough. Negative for shortness of breath and wheezing.   Cardiovascular: Negative for chest pain, palpitations and leg swelling.  Gastrointestinal: Negative for abdominal distention, blood in stool, constipation, diarrhea (soft stool 3-4 times a day) and nausea.       No gerd  Genitourinary: Negative for dysuria and hematuria.       Stress incontinence  Musculoskeletal: Negative for arthralgias and back pain.  Skin: Negative for color change and rash.  Neurological: Negative for light-headedness and headaches.  Psychiatric/Behavioral: Positive for dysphoric mood (mild, intermittent). The patient is nervous/anxious.        Objective:   Vitals:   11/18/16 1408  BP: 136/86  Pulse: 79  Resp: 16  Temp: 98.5 F (36.9 C)   Filed Weights   11/18/16 1408  Weight: 198 lb (89.8 kg)   Body mass index is 35.07 kg/m.  Wt Readings from Last 3 Encounters:  11/18/16 198 lb (89.8 kg)  11/01/16 199 lb 6.4 oz (90.4 kg)  09/10/16 203 lb 0.1 oz (92.1 kg)     Physical Exam Constitutional: She appears well-developed and well-nourished. No distress.  HENT:  Head: Normocephalic and atraumatic.  Right Ear: External ear normal. Normal ear canal and TM Left Ear: External ear normal.  Normal ear canal and TM Mouth/Throat: Oropharynx is clear and moist.  Eyes: Conjunctivae and EOM are normal.  Neck: Neck supple. No tracheal deviation present. No thyromegaly present.  No carotid bruit  Cardiovascular: Normal rate, regular rhythm and normal heart sounds.   No murmur heard.  No edema. Pulmonary/Chest: Effort normal and breath sounds normal. No respiratory distress. She has no wheezes. She has no rales.  Breast: deferred to Gyn Abdominal: Soft. She exhibits no distension. There is no  tenderness.  Lymphadenopathy: She has no cervical adenopathy.  Skin: Skin is warm and dry. She is not diaphoretic.  Psychiatric: She has a normal mood and affect. Her behavior is normal.        Assessment & Plan:   Physical exam: Screening blood work  ordered Immunizations  discussed shingles vaccine Colonoscopy  Up to date  Mammogram  Up to date  Gyn Up to date  Dexa - odered Eye exams  Up to date  EKG - done by cardiology Exercise - walks dogs daily - twice daily some days Weight - work ing on weight loss - now doing vegan diet Skin  - no concerns  Substance abuse  none  See Problem List for Assessment and Plan of chronic medical problems.  FU annually

## 2016-11-18 NOTE — Assessment & Plan Note (Signed)
Check lipids Statin intolerant

## 2016-11-18 NOTE — Assessment & Plan Note (Signed)
BP borderline high Current regimen effective and well tolerated Continue current medications at current doses

## 2016-11-19 NOTE — Telephone Encounter (Signed)
Order placed for echo.

## 2016-11-20 ENCOUNTER — Other Ambulatory Visit (INDEPENDENT_AMBULATORY_CARE_PROVIDER_SITE_OTHER): Payer: Medicare Other

## 2016-11-20 DIAGNOSIS — Z1159 Encounter for screening for other viral diseases: Secondary | ICD-10-CM

## 2016-11-20 DIAGNOSIS — Z0000 Encounter for general adult medical examination without abnormal findings: Secondary | ICD-10-CM | POA: Diagnosis not present

## 2016-11-20 LAB — COMPREHENSIVE METABOLIC PANEL
ALBUMIN: 4.3 g/dL (ref 3.5–5.2)
ALT: 24 U/L (ref 0–35)
AST: 17 U/L (ref 0–37)
Alkaline Phosphatase: 53 U/L (ref 39–117)
BUN: 14 mg/dL (ref 6–23)
CALCIUM: 9.5 mg/dL (ref 8.4–10.5)
CHLORIDE: 106 meq/L (ref 96–112)
CO2: 27 mEq/L (ref 19–32)
CREATININE: 0.88 mg/dL (ref 0.40–1.20)
GFR: 67.6 mL/min (ref >60.00)
Glucose, Bld: 110 mg/dL — ABNORMAL HIGH (ref 70–99)
POTASSIUM: 3.8 meq/L (ref 3.5–5.1)
Sodium: 141 mEq/L (ref 135–145)
Total Bilirubin: 0.6 mg/dL (ref 0.2–1.2)
Total Protein: 7.3 g/dL (ref 6.0–8.3)

## 2016-11-20 LAB — CBC WITH DIFFERENTIAL/PLATELET
BASOS PCT: 0.7 % (ref 0.0–3.0)
Basophils Absolute: 0 10*3/uL (ref 0.0–0.1)
EOS PCT: 3.2 % (ref 0.0–5.0)
Eosinophils Absolute: 0.2 10*3/uL (ref 0.0–0.7)
HEMATOCRIT: 45.4 % (ref 36.0–46.0)
Hemoglobin: 15.4 g/dL — ABNORMAL HIGH (ref 12.0–15.0)
LYMPHS PCT: 21.6 % (ref 12.0–46.0)
Lymphs Abs: 1.6 10*3/uL (ref 0.7–4.0)
MCHC: 33.9 g/dL (ref 30.0–36.0)
MCV: 87.3 fl (ref 78.0–100.0)
Monocytes Absolute: 0.5 10*3/uL (ref 0.1–1.0)
Monocytes Relative: 6.9 % (ref 3.0–12.0)
Neutro Abs: 5 10*3/uL (ref 1.4–7.7)
Neutrophils Relative %: 67.6 % (ref 43.0–77.0)
Platelets: 270 10*3/uL (ref 150.0–400.0)
RBC: 5.21 Mil/uL — ABNORMAL HIGH (ref 3.87–5.11)
RDW: 12.8 % (ref 11.5–15.5)
WBC: 7.3 10*3/uL (ref 4.0–10.5)

## 2016-11-20 LAB — LIPID PANEL
CHOLESTEROL: 175 mg/dL (ref 0–200)
HDL: 55 mg/dL (ref >39.00)
LDL CALC: 101 mg/dL — AB (ref 0–99)
NonHDL: 120.28
TRIGLYCERIDES: 97 mg/dL (ref 0.0–149.0)
Total CHOL/HDL Ratio: 3
VLDL: 19.4 mg/dL (ref 0.0–40.0)

## 2016-11-20 LAB — HEMOGLOBIN A1C: Hgb A1c MFr Bld: 5.5 % (ref 4.6–6.5)

## 2016-11-20 LAB — TSH: TSH: 2.09 u[IU]/mL (ref 0.35–4.50)

## 2016-11-21 ENCOUNTER — Encounter: Payer: Self-pay | Admitting: Internal Medicine

## 2016-11-21 LAB — HEPATITIS C ANTIBODY: HCV Ab: NEGATIVE

## 2016-11-29 ENCOUNTER — Ambulatory Visit (HOSPITAL_COMMUNITY): Payer: Medicare Other | Attending: Cardiology

## 2016-11-29 ENCOUNTER — Other Ambulatory Visit: Payer: Self-pay

## 2016-11-29 DIAGNOSIS — I10 Essential (primary) hypertension: Secondary | ICD-10-CM

## 2016-11-29 DIAGNOSIS — I340 Nonrheumatic mitral (valve) insufficiency: Secondary | ICD-10-CM | POA: Insufficient documentation

## 2016-11-29 DIAGNOSIS — I517 Cardiomegaly: Secondary | ICD-10-CM

## 2016-12-09 ENCOUNTER — Ambulatory Visit (INDEPENDENT_AMBULATORY_CARE_PROVIDER_SITE_OTHER)
Admission: RE | Admit: 2016-12-09 | Discharge: 2016-12-09 | Disposition: A | Discharge status: Home / Self Care | Payer: Medicare Other | Source: Ambulatory Visit | Attending: Internal Medicine | Admitting: Internal Medicine

## 2016-12-09 ENCOUNTER — Encounter: Payer: Self-pay | Admitting: Internal Medicine

## 2016-12-09 DIAGNOSIS — Z13820 Encounter for screening for osteoporosis: Secondary | ICD-10-CM

## 2016-12-09 NOTE — Telephone Encounter (Signed)
Please advise 

## 2016-12-10 MED ORDER — BUDESONIDE-FORMOTEROL FUMARATE 80-4.5 MCG/ACT IN AERO: 2.0000 | INHALATION_SPRAY | Freq: Two times a day (BID) | RESPIRATORY_TRACT | 3 refills | Status: DC

## 2016-12-10 NOTE — Addendum Note (Signed)
Addended by: Binnie Rail on: 12/10/2016 08:15 PM   Modules accepted: Orders

## 2016-12-15 ENCOUNTER — Encounter: Payer: Self-pay | Admitting: Internal Medicine

## 2016-12-15 DIAGNOSIS — M8580 Other specified disorders of bone density and structure, unspecified site: Secondary | ICD-10-CM | POA: Insufficient documentation

## 2017-03-03 ENCOUNTER — Other Ambulatory Visit: Payer: Self-pay | Admitting: Obstetrics and Gynecology

## 2017-03-03 DIAGNOSIS — Z1231 Encounter for screening mammogram for malignant neoplasm of breast: Secondary | ICD-10-CM

## 2017-04-11 ENCOUNTER — Ambulatory Visit
Admission: RE | Admit: 2017-04-11 | Discharge: 2017-04-11 | Disposition: A | Discharge status: Home / Self Care | Payer: Medicare Other | Source: Ambulatory Visit | Attending: Obstetrics and Gynecology | Admitting: Obstetrics and Gynecology

## 2017-04-11 DIAGNOSIS — Z1231 Encounter for screening mammogram for malignant neoplasm of breast: Secondary | ICD-10-CM

## 2017-04-15 ENCOUNTER — Other Ambulatory Visit: Payer: Self-pay | Admitting: Obstetrics and Gynecology

## 2017-04-15 DIAGNOSIS — R928 Other abnormal and inconclusive findings on diagnostic imaging of breast: Secondary | ICD-10-CM

## 2017-04-18 ENCOUNTER — Ambulatory Visit: Admission: RE | Admit: 2017-04-18 | Payer: Medicare Other | Source: Ambulatory Visit

## 2017-04-18 ENCOUNTER — Ambulatory Visit
Admission: RE | Admit: 2017-04-18 | Discharge: 2017-04-18 | Disposition: A | Discharge status: Home / Self Care | Payer: Medicare Other | Source: Ambulatory Visit | Attending: Obstetrics and Gynecology | Admitting: Obstetrics and Gynecology

## 2017-04-18 ENCOUNTER — Ambulatory Visit: Payer: Medicare Other

## 2017-04-18 DIAGNOSIS — R928 Other abnormal and inconclusive findings on diagnostic imaging of breast: Secondary | ICD-10-CM

## 2017-05-17 ENCOUNTER — Ambulatory Visit (INDEPENDENT_AMBULATORY_CARE_PROVIDER_SITE_OTHER): Payer: Medicare Other | Admitting: Internal Medicine

## 2017-05-17 ENCOUNTER — Encounter: Payer: Self-pay | Admitting: Internal Medicine

## 2017-05-17 VITALS — BP 130/88 | HR 65 | Temp 97.9°F | Ht 63.0 in | Wt 201.5 lb

## 2017-05-17 DIAGNOSIS — R21 Rash and other nonspecific skin eruption: Secondary | ICD-10-CM | POA: Insufficient documentation

## 2017-05-17 MED ORDER — VALACYCLOVIR HCL 1 G PO TABS: 1000.0000 mg | ORAL_TABLET | Freq: Three times a day (TID) | ORAL | 0 refills | Status: DC

## 2017-05-17 NOTE — Assessment & Plan Note (Signed)
Other than protracted time course, this looks most like a cold sore Will try valacyclovir

## 2017-05-17 NOTE — Progress Notes (Signed)
Subjective:    Patient ID: Deborah [name], female    DOB: 1946/03/09, 71 y.o.   MRN: [id number]  HPI Here due to rash by her lips  Had something similar 6 years ago--derm told he she had rosacea Started again a few weeks ago--small pustules at right corner of mouth and red spot Tried caladryl and aloe vera  Had left over metrogel--- not helping (it actually caused a rash) Using a gentle cleanser Tried OTC hydrocortisone---got more pustules Stings somewhat  Current Outpatient Prescriptions on File Prior to Visit  Medication Sig Dispense Refill  . albuterol (PROVENTIL HFA;VENTOLIN HFA) 108 (90 Base) MCG/ACT inhaler Inhale 2 puffs into the lungs every 4 (four) hours as needed for wheezing. 1 Inhaler 2  . budesonide-formoterol (SYMBICORT) 80-4.5 MCG/ACT inhaler Inhale 2 puffs into the lungs 2 (two) times daily. 3 Inhaler 3  . Cholecalciferol (VITAMIN D3) 1000 units CAPS Take 1,000 Units by mouth daily.    . Cyanocobalamin (VITAMIN B12 PO) Take by mouth once a week.    . losartan-hydrochlorothiazide (HYZAAR) 100-25 MG tablet Take 1 tablet by mouth daily. 90 tablet 3  . Nebivolol HCl (BYSTOLIC) 20 MG TABS Take 1 tablet (20 mg total) by mouth daily. 90 tablet 3   No current facility-administered medications on file prior to visit.     Allergies  Allergen Reactions  . Statins     Muscle aches with some, elevated lft with crestor  . Propoxyphene N-Acetaminophen     REACTION: GI Upset    Past Medical History:  Diagnosis Date  . Clotting disorder (HCC)    DVT  . 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 GERD (gastroesophageal reflux disease)   . Hyperlipidemia   . Hypertension   . Ocular migraine   . Pneumonia   . UTI (urinary tract infection) 10/10/13   E coli 70,000 colonies    Past Surgical History:  Procedure Laterality Date  . DVT     2-3 X ; on BCP with first  . ENDOVENOUS ABLATION SAPHENOUS VEIN W/ LASER  2947   complicated by DVT   . ENDOVENOUS  ABLATION SAPHENOUS VEIN W/ LASER  2013   Dr Renaldo Reel  . no colonoscopy     SOC reviewed  . TONSILLECTOMY    . TUBAL LIGATION     G3 P3  . WISDOM TOOTH EXTRACTION      Family History  Problem Relation Age of Onset  . Arthritis Mother        OA  . Melanoma Mother 49       in situ  . Transient ischemic attack Mother        late 69s  . COPD Mother   . Cancer Father        renal cancer  . Diabetes Father   . Heart attack Father        in 81s  . Hypertension Father   . Asthma Father   . Hypertension Sister   . Asthma Sister   . Kidney disease Sister        ? from Celebrex  . Kidney failure Sister   . Breast cancer Sister   . Diabetes Brother   . Emphysema Paternal Grandfather   . Stroke Paternal Grandfather        in 52s  . Stroke Paternal Grandmother        in 17s  . Diabetes Paternal Grandmother   . Hypertension Paternal Grandmother   .  Other Sister        Chron's or UC  . Breast cancer Cousin   . Colon polyps Neg Hx   . Esophageal cancer Neg Hx   . Pancreatic cancer Neg Hx   . Rectal cancer Neg Hx   . Stomach cancer Neg Hx     Social History   Social History  . Marital status: Married    Spouse name: N/A  . Number of children: 3  . Years of education: N/A   Occupational History  . retired Therapist, sports    Social History Main Topics  . Smoking status: Former Smoker    Quit date: 10/14/1969  . Smokeless tobacco: Never Used     Comment: Quit at age 89 (smoked x 2 years, up to 1 ppd)   . Alcohol use 1.2 oz/week    2 Glasses of wine per week     Comment: Red wine (2 glasses weekly or less)   . Drug use: No  . Sexual activity: Not on file   Other Topics Concern  . Not on file   Social History Narrative  . No narrative on file   Review of Systems No history of cold sores No dandruff No fever and not sick    Objective:   Physical Exam  Constitutional: She appears well-developed.  HENT:  No oral lesions No drying on lips  Skin:  Isolated red,  slightly raised area ~7 x 66mm triangular at corner of right side of mouth. ?slight vesicular areas in the middle          Assessment & Plan:

## 2017-05-21 ENCOUNTER — Encounter: Payer: Self-pay | Admitting: Internal Medicine

## 2017-05-27 ENCOUNTER — Encounter: Payer: Self-pay | Admitting: Internal Medicine

## 2017-06-09 DIAGNOSIS — Z1389 Encounter for screening for other disorder: Secondary | ICD-10-CM | POA: Diagnosis not present

## 2017-06-09 DIAGNOSIS — Z01419 Encounter for gynecological examination (general) (routine) without abnormal findings: Secondary | ICD-10-CM | POA: Diagnosis not present

## 2017-06-24 ENCOUNTER — Encounter: Payer: Self-pay | Admitting: Internal Medicine

## 2017-06-25 NOTE — Progress Notes (Signed)
Subjective:    Patient ID: Deborah [name], female    DOB: 1945/12/08, 71 y.o.   MRN: [id number]  HPI She is here for an acute visit.   Rash around mouth:  She had the rash for three weeks and then saw Dr Silvio Pate.  At that time it was only on the right side of her mouth.  He thought it looked like herpes and treated her with valtrex.  It helped a little and when she saw her gyn she was prescribed acyclovir.  It did improve, but did not go away completely.  She still has it.  Has had perinasal and perioral rashes years ago.  Was told by derm in past she had rosacea.  She has always had a mild rash in the nose area - .  When this came out she started using her topical rosacea medication and it did not help. Topical metronidazole inceased her rash in the past.  She is still getting new papules.  Hypertension: She is taking her medication daily. She is compliant with a low sodium diet.  She denies chest pain, palpitations, edema, shortness of breath and regular headaches. She is exercising regularly.  She does not monitor her blood pressure at home.    Hyperlipidemia: She is taking her medication daily. She is compliant with a low fat/cholesterol diet - she is eating vegan. She is exercising regularly.   Hyperglycemia:  She is exercising regularly and eating very healthy.  She has been eating vegan.  Medications and allergies reviewed with patient and updated if appropriate.  Patient Active Problem List   Diagnosis Date Noted  . Facial rash 05/17/2017  . Osteopenia 12/15/2016  . Moderate single current episode of major depressive disorder ([place]) 09/10/2016  . Cough 09/10/2016  . SVT (supraventricular tachycardia) ([place]) 03/17/2014  . Asthma 07/29/2012  . Hyperlipidemia 07/19/2008  . Essential hypertension 07/19/2008  . DVT, HX OF 07/19/2008    Current Outpatient Prescriptions on File Prior to Visit  Medication Sig Dispense Refill  . albuterol (PROVENTIL HFA;VENTOLIN HFA) 108 (90  Base) MCG/ACT inhaler Inhale 2 puffs into the lungs every 4 (four) hours as needed for wheezing. 1 Inhaler 2  . budesonide-formoterol (SYMBICORT) 80-4.5 MCG/ACT inhaler Inhale 2 puffs into the lungs 2 (two) times daily. 3 Inhaler 3  . Cholecalciferol (VITAMIN D3) 1000 units CAPS Take 1,000 Units by mouth daily.    . Cyanocobalamin (VITAMIN B12 PO) Take by mouth once a week.    . losartan-hydrochlorothiazide (HYZAAR) 100-25 MG tablet Take 1 tablet by mouth daily. 90 tablet 3  . Nebivolol HCl (BYSTOLIC) 20 MG TABS Take 1 tablet (20 mg total) by mouth daily. 90 tablet 3   No current facility-administered medications on file prior to visit.     Past Medical History:  Diagnosis Date  . Clotting disorder ([facility])    DVT  . 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 GERD (gastroesophageal reflux disease)   . Hyperlipidemia   . Hypertension   . Ocular migraine   . Pneumonia   . UTI (urinary tract infection) 10/10/13   E coli 70,000 colonies    Past Surgical History:  Procedure Laterality Date  . DVT     2-3 X ; on BCP with first  . ENDOVENOUS ABLATION SAPHENOUS VEIN W/ LASER  6160   complicated by DVT   . ENDOVENOUS ABLATION SAPHENOUS VEIN W/ LASER  2013   Dr Renaldo Reel  . no colonoscopy  SOC reviewed  . TONSILLECTOMY    . TUBAL LIGATION     G3 P3  . WISDOM TOOTH EXTRACTION      Social History   Social History  . Marital status: Married    Spouse name: N/A  . Number of children: 3  . Years of education: N/A   Occupational History  . retired Therapist, sports    Social History Main Topics  . Smoking status: Former Smoker    Quit date: 10/14/1969  . Smokeless tobacco: Never Used     Comment: Quit at age 26 (smoked x 2 years, up to 1 ppd)   . Alcohol use 1.2 oz/week    2 Glasses of wine per week     Comment: Red wine (2 glasses weekly or less)   . Drug use: No  . Sexual activity: Not Asked   Other Topics Concern  . None   Social History Narrative  . None    Family  History  Problem Relation Age of Onset  . Arthritis Mother        OA  . Melanoma Mother 27       in situ  . Transient ischemic attack Mother        late 11s  . COPD Mother   . Cancer Father        renal cancer  . Diabetes Father   . Heart attack Father        in 2s  . Hypertension Father   . Asthma Father   . Hypertension Sister   . Asthma Sister   . Kidney disease Sister        ? from Celebrex  . Kidney failure Sister   . Breast cancer Sister   . Diabetes Brother   . Emphysema Paternal Grandfather   . Stroke Paternal Grandfather        in 54s  . Stroke Paternal Grandmother        in 52s  . Diabetes Paternal Grandmother   . Hypertension Paternal Grandmother   . Other Sister        Chron's or UC  . Breast cancer Cousin   . Colon polyps Neg Hx   . Esophageal cancer Neg Hx   . Pancreatic cancer Neg Hx   . Rectal cancer Neg Hx   . Stomach cancer Neg Hx     Review of Systems  Constitutional: Negative for chills and fever.  Respiratory: Positive for cough (chronic). Negative for shortness of breath and wheezing.   Cardiovascular: Negative for chest pain, palpitations and leg swelling.  Skin: Positive for rash.  Neurological: Negative for light-headedness and headaches.       Objective:   Vitals:   06/26/17 0956  BP: (!) 154/84  Pulse: 68  Resp: 16  Temp: 98.6 F (37 C)  SpO2: 96%   Filed Weights   06/26/17 0956  Weight: 200 lb (90.7 kg)   Body mass index is 35.43 kg/m.  Wt Readings from Last 3 Encounters:  06/26/17 200 lb (90.7 kg)  05/17/17 201 lb 8 oz (91.4 kg)  11/18/16 198 lb (89.8 kg)     Physical Exam Constitutional: Appears well-developed and well-nourished. No distress.  HENT:  Head: Normocephalic and atraumatic.  Neck: Neck supple. No tracheal deviation present. No thyromegaly present.  No cervical lymphadenopathy Cardiovascular: Normal rate, regular rhythm and normal heart sounds.   No murmur heard. No carotid bruit .  No  edema Pulmonary/Chest: Effort normal and breath sounds normal. No  respiratory distress. No has no wheezes. No rales.  Skin: Skin is warm and dry. Not diaphoretic. mild perioral rash - papules Psychiatric: Normal mood and affect. Behavior is normal.         Assessment & Plan:   See Problem List for Assessment and Plan of chronic medical problems.

## 2017-06-26 ENCOUNTER — Encounter: Payer: Self-pay | Admitting: Internal Medicine

## 2017-06-26 ENCOUNTER — Ambulatory Visit (INDEPENDENT_AMBULATORY_CARE_PROVIDER_SITE_OTHER): Payer: Medicare Other | Admitting: Internal Medicine

## 2017-06-26 ENCOUNTER — Other Ambulatory Visit (INDEPENDENT_AMBULATORY_CARE_PROVIDER_SITE_OTHER): Payer: Medicare Other

## 2017-06-26 VITALS — BP 154/84 | HR 68 | Temp 98.6°F | Resp 16 | Wt 200.0 lb

## 2017-06-26 DIAGNOSIS — E784 Other hyperlipidemia: Secondary | ICD-10-CM

## 2017-06-26 DIAGNOSIS — I10 Essential (primary) hypertension: Secondary | ICD-10-CM

## 2017-06-26 DIAGNOSIS — Z23 Encounter for immunization: Secondary | ICD-10-CM

## 2017-06-26 DIAGNOSIS — L710 Perioral dermatitis: Secondary | ICD-10-CM | POA: Insufficient documentation

## 2017-06-26 DIAGNOSIS — R739 Hyperglycemia, unspecified: Secondary | ICD-10-CM

## 2017-06-26 DIAGNOSIS — E7849 Other hyperlipidemia: Secondary | ICD-10-CM

## 2017-06-26 LAB — LIPID PANEL
CHOL/HDL RATIO: 3
Cholesterol: 207 mg/dL — ABNORMAL HIGH (ref 0–200)
HDL: 66.6 mg/dL (ref >39.00)
LDL Cholesterol: 115 mg/dL — ABNORMAL HIGH (ref 0–99)
NONHDL: 140.41
TRIGLYCERIDES: 128 mg/dL (ref 0.0–149.0)
VLDL: 25.6 mg/dL (ref 0.0–40.0)

## 2017-06-26 LAB — COMPREHENSIVE METABOLIC PANEL
ALT: 33 U/L (ref 0–35)
AST: 22 U/L (ref 0–37)
Albumin: 4.6 g/dL (ref 3.5–5.2)
Alkaline Phosphatase: 72 U/L (ref 39–117)
BUN: 12 mg/dL (ref 6–23)
CO2: 30 meq/L (ref 19–32)
Calcium: 10 mg/dL (ref 8.4–10.5)
Chloride: 102 mEq/L (ref 96–112)
Creatinine, Ser: 0.96 mg/dL (ref 0.40–1.20)
GFR: 61.04 mL/min (ref >60.00)
Glucose, Bld: 116 mg/dL — ABNORMAL HIGH (ref 70–99)
POTASSIUM: 4.1 meq/L (ref 3.5–5.1)
Sodium: 141 mEq/L (ref 135–145)
Total Bilirubin: 0.8 mg/dL (ref 0.2–1.2)
Total Protein: 7.3 g/dL (ref 6.0–8.3)

## 2017-06-26 LAB — HEMOGLOBIN A1C: HEMOGLOBIN A1C: 5.3 % (ref 4.6–6.5)

## 2017-06-26 MED ORDER — DOXYCYCLINE HYCLATE 100 MG PO TABS: 100.0000 mg | ORAL_TABLET | Freq: Two times a day (BID) | ORAL | 0 refills | Status: DC

## 2017-06-26 NOTE — Assessment & Plan Note (Signed)
Check a1c 

## 2017-06-26 NOTE — Patient Instructions (Signed)
  Test(s) ordered today. Your results will be released to [facility] (or called to you) after review, usually within 72hours after test completion. If any changes need to be made, you will be notified at that same time.   Flu immunization administered today.   Medications reviewed and updated.  Changes include doxycycline for the rash.  Your prescription(s) have been submitted to your pharmacy. Please take as directed and contact our office if you believe you are having problem(s) with the medication(s).

## 2017-06-26 NOTE — Assessment & Plan Note (Signed)
BP controlled at home Current regimen effective and well tolerated Continue current medications at current doses  

## 2017-06-26 NOTE — Assessment & Plan Note (Signed)
Check lipid panel  Regular exercise and healthy diet encouraged  

## 2017-06-26 NOTE — Assessment & Plan Note (Signed)
Did not respond to valtrex or acyclovir - likely peri-oral dermatitis or rosacea She did try a topical med for rosacea and it did not help Will prescribe doxycycline 100 mg bid x 10 days

## 2017-07-09 ENCOUNTER — Encounter: Payer: Self-pay | Admitting: Internal Medicine

## 2017-07-17 DIAGNOSIS — I8312 Varicose veins of left lower extremity with inflammation: Secondary | ICD-10-CM | POA: Diagnosis not present

## 2017-07-17 DIAGNOSIS — I83893 Varicose veins of bilateral lower extremities with other complications: Secondary | ICD-10-CM | POA: Diagnosis not present

## 2017-07-17 DIAGNOSIS — I8311 Varicose veins of right lower extremity with inflammation: Secondary | ICD-10-CM | POA: Diagnosis not present

## 2017-09-03 ENCOUNTER — Encounter: Payer: Self-pay | Admitting: Family Medicine

## 2017-09-03 ENCOUNTER — Ambulatory Visit: Payer: Medicare Other | Admitting: Family Medicine

## 2017-09-03 VITALS — BP 138/84 | HR 72 | Temp 99.2°F | Resp 16 | Ht 63.0 in | Wt 201.2 lb

## 2017-09-03 DIAGNOSIS — J4521 Mild intermittent asthma with (acute) exacerbation: Secondary | ICD-10-CM | POA: Diagnosis not present

## 2017-09-03 DIAGNOSIS — J069 Acute upper respiratory infection, unspecified: Secondary | ICD-10-CM

## 2017-09-03 DIAGNOSIS — H6981 Other specified disorders of Eustachian tube, right ear: Secondary | ICD-10-CM | POA: Diagnosis not present

## 2017-09-03 LAB — POC INFLUENZA A&B (BINAX/QUICKVUE)
Influenza A, POC: NEGATIVE
Influenza B, POC: NEGATIVE

## 2017-09-03 MED ORDER — BENZONATATE 100 MG PO CAPS: 200.0000 mg | ORAL_CAPSULE | Freq: Two times a day (BID) | ORAL | 0 refills | Status: AC | PRN

## 2017-09-03 MED ORDER — PREDNISONE 20 MG PO TABS: 40.0000 mg | ORAL_TABLET | Freq: Every day | ORAL | 0 refills | Status: AC

## 2017-09-03 NOTE — Patient Instructions (Addendum)
  [name] I have seen you today for an acute visit.  A few things to remember from today's visit:   URI, acute  Mild intermittent asthma with acute exacerbation   Medications prescribed today are intended for short period of time and will not be refill upon request, a follow up appointment might be necessary to discuss continuation of of treatment if appropriate.   Negative Flu.   viral infections are self-limited and we treat each symptom depending of severity.  Over the counter medications as decongestants and cold medications usually help, they need to be taken with caution if there is a history of high blood pressure or palpitations. Tylenol and/or Ibuprofen also helps with most symptoms (headache, muscle aching, fever,etc) Plenty of fluids. Honey helps with cough. Steam inhalations helps with runny nose, nasal congestion, and may prevent sinus infections. Cough and nasal congestion could last a few days and sometimes weeks.   Albuterol inh 2 puff every 6 hours for a week then as needed for wheezing or shortness of breath.   Please follow in not any better in 1-2 weeks or if symptoms get worse.   In general please monitor for signs of worsening symptoms and seek immediate medical attention if any concerning.  If symptoms are not resolved in a few days/weeks you should schedule a follow up appointment with your doctor, before if needed.  I hope you get better soon!

## 2017-09-03 NOTE — Progress Notes (Signed)
ACUTE VISIT  HPI:  Chief Complaint  Patient presents with  . Cough  . Nasal Congestion  . Right ear pain    [name] is a 71 y.o.female here today complaining of 3 days of respiratory symptoms.  She is concerned about having pneumonia. Productive cough with clear and light yellowish sputum, mainly in the morning.  Exertional dyspnea and wheezing, alleviated by Albuterol inh.  Hx of asthma, she is on Symbicort and Albuterol in, the latter one last used today at 8 am.  Cough  This is a new problem. The problem has been unchanged. The cough is productive of sputum. Associated symptoms include chills, ear congestion, ear pain (Right), a fever, headaches, myalgias, nasal congestion, postnasal drip, rhinorrhea, a sore throat, shortness of breath and wheezing. Pertinent negatives include no chest pain, eye redness, heartburn, hemoptysis, rash or sweats. The symptoms are aggravated by exercise. Her past medical history is significant for environmental allergies.   Frontal pressure sensation, no associated visual changes,nausea,vomiting,or neurologic deficit.  Fever 101 F, mainly at night.  No Hx of recent travel. + Sick contact: Grandchildren with URI. No known insect bite.  Hx of allergies: Yes.  OTC medications for this problem: Dayquil and Nyquel. Tylenol 500 mg today.  Symptoms otherwise stable.   Review of Systems  Constitutional: Positive for activity change, chills, fatigue and fever. Negative for appetite change.  HENT: Positive for congestion, ear pain (Right), postnasal drip, rhinorrhea, sinus pressure and sore throat. Negative for ear discharge, facial swelling, hearing loss, mouth sores, nosebleeds, trouble swallowing and voice change.   Eyes: Negative for discharge, redness and itching.  Respiratory: Positive for cough, shortness of breath and wheezing. Negative for hemoptysis.   Cardiovascular: Negative for chest pain and leg swelling.    Gastrointestinal: Negative for abdominal pain, diarrhea, heartburn, nausea and vomiting.  Genitourinary: Negative for decreased urine volume and hematuria.  Musculoskeletal: Positive for myalgias. Negative for back pain, joint swelling and neck pain.  Skin: Negative for rash.  Allergic/Immunologic: Positive for environmental allergies.  Neurological: Positive for headaches. Negative for syncope and weakness.  Hematological: Negative for adenopathy. Does not bruise/bleed easily.  Psychiatric/Behavioral: Negative for confusion.      Current Outpatient Medications on File Prior to Visit  Medication Sig Dispense Refill  . albuterol (PROVENTIL HFA;VENTOLIN HFA) 108 (90 Base) MCG/ACT inhaler Inhale 2 puffs into the lungs every 4 (four) hours as needed for wheezing. 1 Inhaler 2  . budesonide-formoterol (SYMBICORT) 80-4.5 MCG/ACT inhaler Inhale 2 puffs into the lungs 2 (two) times daily. 3 Inhaler 3  . Cholecalciferol (VITAMIN D3) 1000 units CAPS Take 1,000 Units by mouth daily.    . Cyanocobalamin (VITAMIN B12 PO) Take by mouth once a week.    . losartan-hydrochlorothiazide (HYZAAR) 100-25 MG tablet Take 1 tablet by mouth daily. 90 tablet 3  . Nebivolol HCl (BYSTOLIC) 20 MG TABS Take 1 tablet (20 mg total) by mouth daily. 90 tablet 3   No current facility-administered medications on file prior to visit.      Past Medical History:  Diagnosis Date  . Clotting disorder (HCC)    DVT  . 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 GERD (gastroesophageal reflux disease)   . Hyperlipidemia   . Hypertension   . Ocular migraine   . Pneumonia   . UTI (urinary tract infection) 10/10/13   E coli 70,000 colonies   Allergies  Allergen Reactions  . Statins  Muscle aches with some, elevated lft with crestor  . Propoxyphene N-Acetaminophen     REACTION: GI Upset    Social History   Socioeconomic History  . Marital status: Married    Spouse name: None  . Number of children: 3  .  Years of education: None  . Highest education level: None  Social Needs  . Financial resource strain: None  . Food insecurity - worry: None  . Food insecurity - inability: None  . Transportation needs - medical: None  . Transportation needs - non-medical: None  Occupational History  . Occupation: retired Therapist, sports  Tobacco Use  . Smoking status: Former Smoker    Last attempt to quit: 10/14/1969    Years since quitting: 47.9  . Smokeless tobacco: Never Used  . Tobacco comment: Quit at age 2 (smoked x 2 years, up to 1 ppd)   Substance and Sexual Activity  . Alcohol use: Yes    Alcohol/week: 1.2 oz    Types: 2 Glasses of wine per week    Comment: Red wine (2 glasses weekly or less)   . Drug use: No  . Sexual activity: None  Other Topics Concern  . None  Social History Narrative  . None    Vitals:   09/03/17 1000  BP: 138/84  Pulse: 72  Resp: 16  Temp: 99.2 F (37.3 C)  SpO2: 97%   Body mass index is 35.65 kg/m.   Physical Exam  Nursing note and vitals reviewed. Constitutional: She is oriented to person, place, and time. She appears well-developed. She does not appear ill. No distress.  HENT:  Head: Normocephalic and atraumatic.  Right Ear: External ear and ear canal normal. No tenderness. Tympanic membrane is not erythematous and not bulging. A middle ear effusion is present.  Left Ear: Tympanic membrane, external ear and ear canal normal.  Nose: Rhinorrhea present. Right sinus exhibits no maxillary sinus tenderness and no frontal sinus tenderness. Left sinus exhibits no maxillary sinus tenderness and no frontal sinus tenderness.  Mouth/Throat: Uvula is midline and mucous membranes are normal. Posterior oropharyngeal erythema (Mild) present. No oropharyngeal exudate or posterior oropharyngeal edema.  Hyperemic nasal mucosa with clear rhinorrhea. Sinus pressure sensation upon palpation of maxillary sinus.Normal sinus transillumination, bilateral. Post nasal drainage.      Eyes: Conjunctivae are normal.  Cardiovascular: Normal rate and regular rhythm.  No murmur heard. Respiratory: Effort normal and breath sounds normal. No stridor. No respiratory distress.  Lymphadenopathy:       Head (right side): No submandibular adenopathy present.       Head (left side): No submandibular adenopathy present.    She has cervical adenopathy.       Right cervical: Posterior cervical adenopathy present.       Left cervical: Posterior cervical adenopathy present.  Neurological: She is alert and oriented to person, place, and time. She has normal strength. Gait normal.  Skin: Skin is warm. No rash noted. No erythema.  Psychiatric: She has a normal mood and affect.  Well groomed, good eye contact.    ASSESSMENT AND PLAN:   Ms. Deborah [name] was seen today for cough, nasal congestion and right ear pain.  Diagnoses and all orders for this visit:  URI, acute  Rapid flu test negative. Symptoms suggests a viral etiology, I explained that examination today does not suggest pneumonia. I do not think imaging is needed today but CXR was offered, she prefers to hold on it. Steam inhalations and nasal saline as needed may  also help. Symptomatic treatment recommended, I do not think abx is needed at this time. Instructed to monitor for signs of complications,, clearly instructed about warning signs. I also explained that cough and nasal congestion can last a few days and sometimes weeks. F/U as needed.   -     benzonatate (TESSALON) 100 MG capsule; Take 2 capsules (200 mg total) by mouth 2 (two) times daily as needed for up to 10 days for cough. -     POC Influenza A&B (Binax test)  Mild intermittent asthma with acute exacerbation  Lung auscultation today negative for wheezing, clear bilateral. Recommend Albuterol inh 2 puff every 6 hours for a week then as needed for wheezing or shortness of breath.  Short course of Prednisone, side effects discussed. Instructed about warning  signs.  -     predniSONE (DELTASONE) 20 MG tablet; Take 2 tablets (40 mg total) by mouth daily with breakfast for 3 days.  Eustachian tube dysfunction, right  OTC decongestants may help, side effects discussed. Auto inflation maneuvers a few times per day recommended. F/U as needed.    -Ms. Deborah [name] was advised to seek attention immediately if symptoms worsen or to follow with PCP if they persist or new concerns arise, she voices understanding.       Denis Koppel G. [place], MD  [facility]. [place] office.

## 2017-09-07 ENCOUNTER — Other Ambulatory Visit: Payer: Self-pay

## 2017-09-07 ENCOUNTER — Encounter (HOSPITAL_BASED_OUTPATIENT_CLINIC_OR_DEPARTMENT_OTHER): Payer: Self-pay | Admitting: Emergency Medicine

## 2017-09-07 ENCOUNTER — Emergency Department (HOSPITAL_BASED_OUTPATIENT_CLINIC_OR_DEPARTMENT_OTHER)
Admission: EM | Admit: 2017-09-07 | Discharge: 2017-09-07 | Disposition: A | Discharge status: Home / Self Care | Payer: Medicare Other | Attending: Emergency Medicine | Admitting: Emergency Medicine

## 2017-09-07 ENCOUNTER — Emergency Department (HOSPITAL_BASED_OUTPATIENT_CLINIC_OR_DEPARTMENT_OTHER): Payer: Medicare Other

## 2017-09-07 DIAGNOSIS — I10 Essential (primary) hypertension: Secondary | ICD-10-CM | POA: Diagnosis not present

## 2017-09-07 DIAGNOSIS — Z87891 Personal history of nicotine dependence: Secondary | ICD-10-CM | POA: Insufficient documentation

## 2017-09-07 DIAGNOSIS — R05 Cough: Secondary | ICD-10-CM | POA: Diagnosis not present

## 2017-09-07 DIAGNOSIS — J181 Lobar pneumonia, unspecified organism: Secondary | ICD-10-CM | POA: Diagnosis not present

## 2017-09-07 DIAGNOSIS — J189 Pneumonia, unspecified organism: Secondary | ICD-10-CM

## 2017-09-07 DIAGNOSIS — J45909 Unspecified asthma, uncomplicated: Secondary | ICD-10-CM | POA: Diagnosis not present

## 2017-09-07 MED ORDER — IPRATROPIUM BROMIDE 0.02 % IN SOLN
0.5000 mg | Freq: Once | RESPIRATORY_TRACT | Status: AC
  Administered 2017-09-07: 0.5 mg via RESPIRATORY_TRACT
  Filled 2017-09-07: qty 2.5

## 2017-09-07 MED ORDER — PREDNISONE 20 MG PO TABS: 40.0000 mg | ORAL_TABLET | Freq: Every day | ORAL | 0 refills | Status: DC

## 2017-09-07 MED ORDER — ALBUTEROL SULFATE (2.5 MG/3ML) 0.083% IN NEBU
5.0000 mg | INHALATION_SOLUTION | Freq: Once | RESPIRATORY_TRACT | Status: AC
  Administered 2017-09-07: 5 mg via RESPIRATORY_TRACT
  Filled 2017-09-07: qty 6

## 2017-09-07 MED ORDER — HYDROCODONE-HOMATROPINE 5-1.5 MG/5ML PO SYRP: 5.0000 mL | ORAL_SOLUTION | Freq: Four times a day (QID) | ORAL | 0 refills | Status: DC | PRN

## 2017-09-07 MED ORDER — DOXYCYCLINE HYCLATE 100 MG PO CAPS: 100.0000 mg | ORAL_CAPSULE | Freq: Two times a day (BID) | ORAL | 0 refills | Status: DC

## 2017-09-07 NOTE — ED Provider Notes (Signed)
[facility] Provider Note   CSN: [id number] Arrival date & time: 09/07/17  1114     History   Chief Complaint Chief Complaint  Patient presents with  . Cough    HPI Deborah [name] is a 71 y.o. female.  Patient is a 71 year old female with a history of UTI, hypertension, hyperlipidemia, DVT, SVT who is presenting with URI symptoms that started about a week and a half ago.  She is continued to cough with green and yellow sputum production, facial pain, nasal congestion, drainage down her throat, hoarseness and right ear pain.  Patient states Monday through Wednesday she ran a low-grade fever of 99-100.5.  She saw her doctor on Wednesday and at that time was given prednisone but was not found to have any other issues.  She has started taking the prednisone but states she continues to cough, unable to sleep at night and not improving.  She has not had any fever for the last few days.  But she does notice she is winded when walking up the stairs or trying to walk the dogs.  She denies any recent hospitalizations, long trips, leg swelling, chest pain.     The history is provided by the patient and the spouse.  Cough  This is a new problem. Episode onset: 1.5 weeks. The problem occurs constantly. The problem has not changed since onset.The cough is productive of sputum. The maximum temperature recorded prior to her arrival was 100 to 100.9 F. The fever has been present for 3 to 4 days. Associated symptoms include ear congestion, ear pain, rhinorrhea and shortness of breath. Treatments tried: prednisone and albuterol. The treatment provided no relief. She is not a smoker. Her past medical history is significant for asthma.    Past Medical History:  Diagnosis Date  . Clotting disorder (HCC)    DVT  . 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 GERD (gastroesophageal reflux disease)   . Hyperlipidemia   . Hypertension   . Ocular migraine   .  Pneumonia   . UTI (urinary tract infection) 10/10/13   E coli 70,000 colonies    Patient Active Problem List   Diagnosis Date Noted  . Perioral dermatitis 06/26/2017  . Hyperglycemia 06/26/2017  . Facial rash 05/17/2017  . Osteopenia 12/15/2016  . Moderate single current episode of major depressive disorder ([place]) 09/10/2016  . Cough 09/10/2016  . SVT (supraventricular tachycardia) ([place]) 03/17/2014  . Asthma 07/29/2012  . Hyperlipidemia 07/19/2008  . Essential hypertension 07/19/2008  . DVT, HX OF 07/19/2008    Past Surgical History:  Procedure Laterality Date  . DVT     2-3 X ; on BCP with first  . ENDOVENOUS ABLATION SAPHENOUS VEIN W/ LASER  4098   complicated by DVT   . ENDOVENOUS ABLATION SAPHENOUS VEIN W/ LASER  2013   Dr Renaldo Reel  . no colonoscopy     SOC reviewed  . TONSILLECTOMY    . TUBAL LIGATION     G3 P3  . WISDOM TOOTH EXTRACTION      OB History    No data available       Home Medications    Prior to Admission medications   Medication Sig Start Date End Date Taking? Authorizing Provider  albuterol (PROVENTIL HFA;VENTOLIN HFA) 108 (90 Base) MCG/ACT inhaler Inhale 2 puffs into the lungs every 4 (four) hours as needed for wheezing. 11/01/16   Gerda Diss, DO  benzonatate (TESSALON) 100  MG capsule Take 2 capsules (200 mg total) by mouth 2 (two) times daily as needed for up to 10 days for cough. 09/03/17 09/13/17  Martinique, Betty G, MD  budesonide-formoterol [facility]) 80-4.5 MCG/ACT inhaler Inhale 2 puffs into the lungs 2 (two) times daily. 12/10/16   Binnie Rail, MD  Cholecalciferol (VITAMIN D3) 1000 units CAPS Take 1,000 Units by mouth daily.    [provider]  Cyanocobalamin (VITAMIN B12 PO) Take by mouth once a week.    [provider]  doxycycline (VIBRAMYCIN) 100 MG capsule Take 1 capsule (100 mg total) by mouth 2 (two) times daily. 09/07/17   Blanchie Dessert, MD  HYDROcodone-homatropine (HYCODAN) 5-1.5 MG/5ML syrup Take 5  mLs by mouth every 6 (six) hours as needed for cough. 09/07/17   Blanchie Dessert, MD  losartan-hydrochlorothiazide (HYZAAR) 100-25 MG tablet Take 1 tablet by mouth daily. 09/02/16   Belva Crome, MD  Nebivolol HCl (BYSTOLIC) 20 MG TABS Take 1 tablet (20 mg total) by mouth daily. 09/02/16   Belva Crome, MD  predniSONE (DELTASONE) 20 MG tablet Take 2 tablets (40 mg total) by mouth daily. 09/07/17   Blanchie Dessert, MD    Family History Family History  Problem Relation Age of Onset  . Arthritis Mother        OA  . Melanoma Mother 49       in situ  . Transient ischemic attack Mother        late 67s  . COPD Mother   . Cancer Father        renal cancer  . Diabetes Father   . Heart attack Father        in 55s  . Hypertension Father   . Asthma Father   . Hypertension Sister   . Asthma Sister   . Kidney disease Sister        ? from Celebrex  . Kidney failure Sister   . Breast cancer Sister   . Diabetes Brother   . Emphysema Paternal Grandfather   . Stroke Paternal Grandfather        in 34s  . Stroke Paternal Grandmother        in 51s  . Diabetes Paternal Grandmother   . Hypertension Paternal Grandmother   . Other Sister        Chron's or UC  . Breast cancer Cousin   . Colon polyps Neg Hx   . Esophageal cancer Neg Hx   . Pancreatic cancer Neg Hx   . Rectal cancer Neg Hx   . Stomach cancer Neg Hx     Social History Social History   Tobacco Use  . Smoking status: Former Smoker    Last attempt to quit: 10/14/1969    Years since quitting: 47.9  . Smokeless tobacco: Never Used  . Tobacco comment: Quit at age 81 (smoked x 2 years, up to 1 ppd)   Substance Use Topics  . Alcohol use: Yes    Alcohol/week: 1.2 oz    Types: 2 Glasses of wine per week    Comment: Red wine (2 glasses weekly or less)   . Drug use: No     Allergies   Statins and Propoxyphene n-acetaminophen   Review of Systems Review of Systems  HENT: Positive for ear pain and rhinorrhea.     Respiratory: Positive for cough and shortness of breath.   All other systems reviewed and are negative.    Physical Exam Updated Vital Signs BP Marland Kitchen)  178/89 (BP Location: Left Arm)   Pulse 72   Temp 98.5 F (36.9 C) (Oral)   Resp 20   SpO2 100%   Physical Exam  Constitutional: She is oriented to person, place, and time. She appears well-developed and well-nourished. No distress.  HENT:  Head: Normocephalic and atraumatic.  Right Ear: A middle ear effusion is present.  Left Ear: A middle ear effusion is present.  Nose: Mucosal edema present.  Mouth/Throat: Oropharynx is clear and moist.  Eyes: Conjunctivae and EOM are normal. Pupils are equal, round, and reactive to light.  Neck: Normal range of motion. Neck supple.  Cardiovascular: Normal rate, regular rhythm and intact distal pulses.  No murmur heard. Pulmonary/Chest: Effort normal and breath sounds normal. No respiratory distress. She has no wheezes. She has no rales.  Abdominal: Soft. She exhibits no distension. There is no tenderness. There is no rebound and no guarding.  Musculoskeletal: Normal range of motion. She exhibits no edema or tenderness.  Neurological: She is alert and oriented to person, place, and time.  Skin: Skin is warm and dry. No rash noted. No erythema.  Psychiatric: She has a normal mood and affect. Her behavior is normal.  Nursing note and vitals reviewed.    ED Treatments / Results  Labs (all labs ordered are listed, but only abnormal results are displayed) Labs Reviewed - No data to display  EKG  EKG Interpretation None       Radiology Dg Chest 2 View  Result Date: 09/07/2017 CLINICAL DATA:  71 year old female with productive cough and fever for 1 week. EXAM: CHEST  2 VIEW COMPARISON:  09/10/2016 FINDINGS: Cardiomediastinal silhouette is enlarged but stable from prior studies. There is mild pulmonary vascular congestion without frank edema. Slightly increased opacity along the right heart  border suggesting early infiltrate. The left lung is clear. No pleural effusions or pneumothorax. No acute osseous abnormalities. IMPRESSION: Right middle lobe infiltrate.  Follow-up to resolution recommended. Electronically Signed   By: Kristopher Oppenheim M.D.   On: 09/07/2017 11:50    Procedures Procedures (including critical care time)  Medications Ordered in ED Medications  albuterol (PROVENTIL) (2.5 MG/3ML) 0.083% nebulizer solution 5 mg (5 mg Nebulization Given 09/07/17 1223)  ipratropium (ATROVENT) nebulizer solution 0.5 mg (0.5 mg Nebulization Given 09/07/17 1223)     Initial Impression / Assessment and Plan / ED Course  I have reviewed the triage vital signs and the nursing notes.  Pertinent labs & imaging results that were available during my care of the patient were reviewed by me and considered in my medical decision making (see chart for details).     Patient with persistent URI symptoms over a week with fevers earlier this week.  She has been taking prednisone and using her inhaler but symptoms are not improving.  X-ray today shows a right middle lobe infiltrate.  Patient otherwise is satting normally, in no acute distress and has no reason for multidrug-resistant organism.  Patient was treated with albuterol and Atrovent.  She has plenty of albuterol at home.  She only took 3 days of prednisone so we will give her a longer prescription as well as doxycycline for pneumonia.  No evidence today to suggest PE, CHF or cardiac cause of her symptoms.  Final Clinical Impressions(s) / ED Diagnoses   Final diagnoses:  Community acquired pneumonia of right middle lobe of lung [facility])    ED Discharge Orders        Ordered    doxycycline (VIBRAMYCIN) 100  MG capsule  2 times daily     09/07/17 1208    predniSONE (DELTASONE) 20 MG tablet  Daily     09/07/17 1208    HYDROcodone-homatropine (HYCODAN) 5-1.5 MG/5ML syrup  Every 6 hours PRN     09/07/17 1208       Blanchie Dessert,  MD 09/07/17 1241

## 2017-09-07 NOTE — ED Triage Notes (Signed)
Patient states that she has had a Cough and hoarse x 1 week. THe patient reports that she had a fever last week - but not in the last 24 hours. Saw a MD on Wed and was given medication cough and steriod

## 2017-09-09 ENCOUNTER — Ambulatory Visit: Payer: Medicare Other | Admitting: Interventional Cardiology

## 2017-09-30 ENCOUNTER — Other Ambulatory Visit: Payer: Self-pay | Admitting: Interventional Cardiology

## 2017-10-18 DIAGNOSIS — R350 Frequency of micturition: Secondary | ICD-10-CM | POA: Diagnosis not present

## 2017-10-18 DIAGNOSIS — R300 Dysuria: Secondary | ICD-10-CM | POA: Diagnosis not present

## 2017-10-20 ENCOUNTER — Encounter: Payer: Self-pay | Admitting: Internal Medicine

## 2017-10-20 ENCOUNTER — Ambulatory Visit: Payer: Medicare Other | Admitting: Internal Medicine

## 2017-10-20 ENCOUNTER — Other Ambulatory Visit: Payer: Medicare Other

## 2017-10-20 VITALS — BP 154/90 | HR 76 | Temp 98.8°F | Resp 16 | Wt 205.0 lb

## 2017-10-20 DIAGNOSIS — R300 Dysuria: Secondary | ICD-10-CM

## 2017-10-20 LAB — POCT URINALYSIS DIPSTICK
BILIRUBIN UA: NEGATIVE
Glucose, UA: NEGATIVE
KETONES UA: NEGATIVE
Leukocytes, UA: NEGATIVE
NITRITE UA: NEGATIVE
PROTEIN UA: NEGATIVE
RBC UA: NEGATIVE
SPEC GRAV UA: 1.015 (ref 1.010–1.025)
Urobilinogen, UA: 0.2 E.U./dL
pH, UA: 6 (ref 5.0–8.0)

## 2017-10-20 MED ORDER — NITROFURANTOIN MONOHYD MACRO 100 MG PO CAPS: 100.0000 mg | ORAL_CAPSULE | Freq: Two times a day (BID) | ORAL | 0 refills | Status: DC

## 2017-10-20 NOTE — Progress Notes (Signed)
Subjective:    Patient ID: Deborah [name], female    DOB: 07/25/1946, 72 y.o.   MRN: [id number]  HPI She is here for an acute visit.   UTI?:  Her symptoms started 4 days ago.  [facility] did [facility] and her urine dip was negative so she was not treated.  She did a home UTI test and initially it was negative and this morning it was positive for nitrate.   She is having cloudy urine, foul smelling urine especially in the morning.  She also states urinary urgency, urinary frequency, occasional nausea, and has not been feeling well overall.   She denies dysuria, hematuria and fever.  She has had a little lower abdominal tenderness.  She denies increased back pain - has chronic back pain.   Medications and allergies reviewed with patient and updated if appropriate.  Patient Active Problem List   Diagnosis Date Noted  . Perioral dermatitis 06/26/2017  . Hyperglycemia 06/26/2017  . Facial rash 05/17/2017  . Osteopenia 12/15/2016  . Moderate single current episode of major depressive disorder ([place]) 09/10/2016  . Cough 09/10/2016  . SVT (supraventricular tachycardia) ([place]) 03/17/2014  . Asthma 07/29/2012  . Hyperlipidemia 07/19/2008  . Essential hypertension 07/19/2008  . DVT, HX OF 07/19/2008    Current Outpatient Medications on File Prior to Visit  Medication Sig Dispense Refill  . albuterol (PROVENTIL HFA;VENTOLIN HFA) 108 (90 Base) MCG/ACT inhaler Inhale 2 puffs into the lungs every 4 (four) hours as needed for wheezing. 1 Inhaler 2  . budesonide-formoterol (SYMBICORT) 80-4.5 MCG/ACT inhaler Inhale 2 puffs into the lungs 2 (two) times daily. 3 Inhaler 3  . BYSTOLIC 20 MG TABS TAKE 1 TABLET BY MOUTH  DAILY 90 tablet 0  . Cholecalciferol (VITAMIN D3) 1000 units CAPS Take 1,000 Units by mouth daily.    . Cyanocobalamin (VITAMIN B12 PO) Take by mouth once a week.    . losartan-hydrochlorothiazide (HYZAAR) 100-25 MG tablet TAKE 1 TABLET BY MOUTH  DAILY 90 tablet 0    No current facility-administered medications on file prior to visit.     Past Medical History:  Diagnosis Date  . Clotting disorder (HCC)    DVT  . 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 GERD (gastroesophageal reflux disease)   . Hyperlipidemia   . Hypertension   . Ocular migraine   . Pneumonia   . UTI (urinary tract infection) 10/10/13   E coli 70,000 colonies    Past Surgical History:  Procedure Laterality Date  . DVT     2-3 X ; on BCP with first  . ENDOVENOUS ABLATION SAPHENOUS VEIN W/ LASER  5852   complicated by DVT   . ENDOVENOUS ABLATION SAPHENOUS VEIN W/ LASER  2013   Dr Renaldo Reel  . no colonoscopy     SOC reviewed  . TONSILLECTOMY    . TUBAL LIGATION     G3 P3  . WISDOM TOOTH EXTRACTION      Social History   Socioeconomic History  . Marital status: Married    Spouse name: None  . Number of children: 3  . Years of education: None  . Highest education level: None  Social Needs  . Financial resource strain: None  . Food insecurity - worry: None  . Food insecurity - inability: None  . Transportation needs - medical: None  . Transportation needs - non-medical: None  Occupational History  . Occupation: retired Therapist, sports  Tobacco Use  .  Smoking status: Former Smoker    Last attempt to quit: 10/14/1969    Years since quitting: 48.0  . Smokeless tobacco: Never Used  . Tobacco comment: Quit at age 72 (smoked x 2 years, up to 1 ppd)   Substance and Sexual Activity  . Alcohol use: Yes    Alcohol/week: 1.2 oz    Types: 2 Glasses of wine per week    Comment: Red wine (2 glasses weekly or less)   . Drug use: No  . Sexual activity: None  Other Topics Concern  . None  Social History Narrative  . None    Family History  Problem Relation Age of Onset  . Arthritis Mother        OA  . Melanoma Mother 82       in situ  . Transient ischemic attack Mother        late 12s  . COPD Mother   . Cancer Father        renal cancer  . Diabetes Father   .  Heart attack Father        in 33s  . Hypertension Father   . Asthma Father   . Hypertension Sister   . Asthma Sister   . Kidney disease Sister        ? from Celebrex  . Kidney failure Sister   . Breast cancer Sister   . Diabetes Brother   . Emphysema Paternal Grandfather   . Stroke Paternal Grandfather        in 61s  . Stroke Paternal Grandmother        in 75s  . Diabetes Paternal Grandmother   . Hypertension Paternal Grandmother   . Other Sister        Chron's or UC  . Breast cancer Cousin   . Colon polyps Neg Hx   . Esophageal cancer Neg Hx   . Pancreatic cancer Neg Hx   . Rectal cancer Neg Hx   . Stomach cancer Neg Hx     Review of Systems  Constitutional: Negative for chills and fever.  Gastrointestinal: Positive for nausea (occasional). Negative for abdominal pain (minimal in lower abdomen).  Genitourinary: Positive for frequency and urgency. Negative for dysuria and hematuria.  Neurological: Positive for light-headedness.       Objective:   Vitals:   10/20/17 1309  BP: (!) 154/90  Pulse: 76  Resp: 16  Temp: 98.8 F (37.1 C)  SpO2: 96%   Wt Readings from Last 3 Encounters:  10/20/17 205 lb (93 kg)  09/03/17 201 lb 4 oz (91.3 kg)  06/26/17 200 lb (90.7 kg)   Body mass index is 36.31 kg/m.   Physical Exam  Constitutional: She appears well-developed and well-nourished. No distress.  Abdominal: Soft. She exhibits no distension. There is no tenderness. There is no rebound and no guarding.  Genitourinary:  Genitourinary Comments: No cva tenderness  Skin: Skin is warm and dry. She is not diaphoretic.         Assessment & Plan:    See Problem List for Assessment and Plan of chronic medical problems.

## 2017-10-20 NOTE — Patient Instructions (Signed)
Take the antibiotic as prescribed.  Take tylenol if needed.  Increase your water intake.   We will let you know the results of the culture   Call if no improvement

## 2017-10-20 NOTE — Assessment & Plan Note (Addendum)
Urine dip negative for UTI, but symptoms convincing for UTI - had home test positive for nitates Start macrobid bid x 7 days Will send urine for culture Call if no improvement

## 2017-10-23 ENCOUNTER — Encounter: Payer: Self-pay | Admitting: Internal Medicine

## 2017-10-23 LAB — URINE CULTURE
MICRO NUMBER: 90023387
SPECIMEN QUALITY: ADEQUATE

## 2017-10-29 ENCOUNTER — Ambulatory Visit: Payer: Medicare Other | Admitting: Interventional Cardiology

## 2017-11-20 NOTE — Progress Notes (Signed)
Cardiology Office Note    Date:  11/20/2017   ID:  Deborah [name], DOB 1946/06/15, MRN [id number]  PCP:  Binnie Rail, MD  Cardiologist: Sinclair Grooms, MD   No chief complaint on file.   History of Present Illness:  Deborah [name] is a 72 y.o. female who presents for hypertension, palpitations, history of DVT, hyperlipidemia, and family history of vascular disease.   She is doing well.  He is under stress because of family issues with both grandchildren and her daughter living with her.  She is gaining weight.  She has no particular cardiac symptoms.  She specifically denies orthopnea, PND, edema, chest pain, prolonged palpitations, and syncope.   Past Medical History:  Diagnosis Date  . Clotting disorder (HCC)    DVT  . 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 GERD (gastroesophageal reflux disease)   . Hyperlipidemia   . Hypertension   . Ocular migraine   . Pneumonia   . UTI (urinary tract infection) 10/10/13   E coli 70,000 colonies    Past Surgical History:  Procedure Laterality Date  . DVT     2-3 X ; on BCP with first  . ENDOVENOUS ABLATION SAPHENOUS VEIN W/ LASER  3734   complicated by DVT   . ENDOVENOUS ABLATION SAPHENOUS VEIN W/ LASER  2013   Dr Renaldo Reel  . no colonoscopy     SOC reviewed  . TONSILLECTOMY    . TUBAL LIGATION     G3 P3  . WISDOM TOOTH EXTRACTION      Current Medications: Outpatient Medications Prior to Visit  Medication Sig Dispense Refill  . albuterol (PROVENTIL HFA;VENTOLIN HFA) 108 (90 Base) MCG/ACT inhaler Inhale 2 puffs into the lungs every 4 (four) hours as needed for wheezing. 1 Inhaler 2  . budesonide-formoterol (SYMBICORT) 80-4.5 MCG/ACT inhaler Inhale 2 puffs into the lungs 2 (two) times daily. 3 Inhaler 3  . BYSTOLIC 20 MG TABS TAKE 1 TABLET BY MOUTH  DAILY 90 tablet 0  . Cholecalciferol (VITAMIN D3) 1000 units CAPS Take 1,000 Units by mouth daily.    . Cyanocobalamin (VITAMIN B12 PO) Take by  mouth once a week.    . losartan-hydrochlorothiazide (HYZAAR) 100-25 MG tablet TAKE 1 TABLET BY MOUTH  DAILY 90 tablet 0  . nitrofurantoin, macrocrystal-monohydrate, (MACROBID) 100 MG capsule Take 1 capsule (100 mg total) by mouth 2 (two) times daily. 14 capsule 0   No facility-administered medications prior to visit.      Allergies:   Statins and Propoxyphene n-acetaminophen   Social History   Socioeconomic History  . Marital status: Married    Spouse name: Not on file  . Number of children: 3  . Years of education: Not on file  . Highest education level: Not on file  Social Needs  . Financial resource strain: Not on file  . Food insecurity - worry: Not on file  . Food insecurity - inability: Not on file  . Transportation needs - medical: Not on file  . Transportation needs - non-medical: Not on file  Occupational History  . Occupation: retired Therapist, sports  Tobacco Use  . Smoking status: Former Smoker    Last attempt to quit: 10/14/1969    Years since quitting: 48.1  . Smokeless tobacco: Never Used  . Tobacco comment: Quit at age 53 (smoked x 2 years, up to 1 ppd)   Substance and Sexual Activity  . Alcohol use: Yes  Alcohol/week: 1.2 oz    Types: 2 Glasses of wine per week    Comment: Red wine (2 glasses weekly or less)   . Drug use: No  . Sexual activity: Not on file  Other Topics Concern  . Not on file  Social History Narrative  . Not on file     Family History:  The patient's family history includes Arthritis in her mother; Asthma in her father and sister; Breast cancer in her cousin and sister; COPD in her mother; Cancer in her father; Diabetes in her brother, father, and paternal grandmother; Emphysema in her paternal grandfather; Heart attack in her father; Hypertension in her father, paternal grandmother, and sister; Kidney disease in her sister; Kidney failure in her sister; Melanoma (age of onset: 80) in her mother; Other in her sister; Stroke in her paternal grandfather  and paternal grandmother; Transient ischemic attack in her mother.   ROS:   Please see the history of present illness.    Recent upper respiratory congestion and cough.  No wheezing.  No significant episodes of asthma. All other systems reviewed and are negative.   PHYSICAL EXAM:   VS:  There were no vitals taken for this visit.   GEN: Well nourished, well developed, in no acute distress  HEENT: normal  Neck: no JVD, carotid bruits, or masses Cardiac: RRR; no murmurs, rubs, or gallops,no edema  Respiratory:  clear to auscultation bilaterally, normal work of breathing GI: soft, nontender, nondistended, + BS MS: no deformity or atrophy  Skin: warm and dry, no rash Neuro:  Alert and Oriented x 3, Strength and sensation are intact Psych: euthymic mood, full affect  Wt Readings from Last 3 Encounters:  10/20/17 205 lb (93 kg)  09/03/17 201 lb 4 oz (91.3 kg)  06/26/17 200 lb (90.7 kg)      Studies/Labs Reviewed:   EKG:  EKG decreased voltage, poor R wave progression V1 through V5.  There is no change in the appearance of the EKG when compared to November 2017.  Recent Labs: 06/26/2017: ALT 33; BUN 12; Creatinine, Ser 0.96; Potassium 4.1; Sodium 141   Lipid Panel    Component Value Date/Time   CHOL 207 (H) 06/26/2017 1045   TRIG 128.0 06/26/2017 1045   HDL 66.60 06/26/2017 1045   CHOLHDL 3 06/26/2017 1045   VLDL 25.6 06/26/2017 1045   LDLCALC 115 (H) 06/26/2017 1045   LDLDIRECT 147.1 07/30/2012 0850    Additional studies/ records that were reviewed today include:  Most recent LDL cholesterol was noted above.    ASSESSMENT:    1. Essential hypertension   2. SVT (supraventricular tachycardia) (HCC)   3. Other hyperlipidemia      PLAN:  In order of problems listed above:  1. Currently taking decongestants and Advil because of her upper respiratory illness.  Blood pressure is higher than I would like.  States her blood pressures are much better under circumstances  different than these.  She measures the pressure regularly at home and it is under 140/90 mmHg.  Advised low-salt diet and to cut back on Advil. 2. No recurrent SVT. 3. Preferred LDL target less than.  Weight loss, aerobic exercise, salt restriction, blood pressures at home and if consistently greater than 140/90  Medication Adjustments/Labs and Tests Ordered: Current medicines are reviewed at length with the patient today.  Concerns regarding medicines are outlined above.  Medication changes, Labs and Tests ordered today are listed in the Patient Instructions below. There are no Patient  Instructions on file for this visit.   Signed, Sinclair Grooms, MD  11/20/2017 9:00 PM    [address], [place], [place]  [address] Phone: [phone]; Fax: [phone]

## 2017-11-21 ENCOUNTER — Encounter: Payer: Self-pay | Admitting: Interventional Cardiology

## 2017-11-21 ENCOUNTER — Ambulatory Visit: Payer: Medicare Other | Admitting: Interventional Cardiology

## 2017-11-21 VITALS — BP 160/92 | HR 66 | Ht 63.0 in | Wt 197.0 lb

## 2017-11-21 DIAGNOSIS — I471 Supraventricular tachycardia: Secondary | ICD-10-CM | POA: Diagnosis not present

## 2017-11-21 DIAGNOSIS — E7849 Other hyperlipidemia: Secondary | ICD-10-CM | POA: Diagnosis not present

## 2017-11-21 DIAGNOSIS — I10 Essential (primary) hypertension: Secondary | ICD-10-CM | POA: Diagnosis not present

## 2017-11-21 NOTE — Patient Instructions (Signed)

## 2018-01-05 ENCOUNTER — Other Ambulatory Visit: Payer: Self-pay | Admitting: Interventional Cardiology

## 2018-02-16 ENCOUNTER — Ambulatory Visit: Payer: Medicare Other | Admitting: Family

## 2018-02-16 ENCOUNTER — Encounter: Payer: Self-pay | Admitting: Family

## 2018-02-16 ENCOUNTER — Other Ambulatory Visit: Payer: Medicare Other

## 2018-02-16 VITALS — BP 150/82 | HR 67 | Temp 98.5°F | Ht 63.0 in | Wt 181.0 lb

## 2018-02-16 DIAGNOSIS — R300 Dysuria: Secondary | ICD-10-CM | POA: Diagnosis not present

## 2018-02-16 LAB — POC URINALSYSI DIPSTICK (AUTOMATED)
BILIRUBIN UA: NEGATIVE
Blood, UA: NEGATIVE
GLUCOSE UA: NEGATIVE
Ketones, UA: NEGATIVE
LEUKOCYTES UA: NEGATIVE
NITRITE UA: NEGATIVE
PH UA: 6 (ref 5.0–8.0)
Protein, UA: NEGATIVE
Spec Grav, UA: 1.01 (ref 1.010–1.025)
Urobilinogen, UA: 0.2 E.U./dL

## 2018-02-16 MED ORDER — AMOXICILLIN-POT CLAVULANATE 875-125 MG PO TABS: 1.0000 | ORAL_TABLET | Freq: Two times a day (BID) | ORAL | 0 refills | Status: DC

## 2018-02-16 NOTE — Progress Notes (Signed)
Deborah [name] is a 72 y.o. female with the following history as recorded in EpicCare:  Patient Active Problem List   Diagnosis Date Noted  . Dysuria 10/20/2017  . Perioral dermatitis 06/26/2017  . Hyperglycemia 06/26/2017  . Facial rash 05/17/2017  . Osteopenia 12/15/2016  . Moderate single current episode of major depressive disorder ([place]) 09/10/2016  . Cough 09/10/2016  . SVT (supraventricular tachycardia) ([place]) 03/17/2014  . Asthma 07/29/2012  . Hyperlipidemia 07/19/2008  . Essential hypertension 07/19/2008  . DVT, HX OF 07/19/2008    Current Outpatient Medications  Medication Sig Dispense Refill  . albuterol (PROVENTIL HFA;VENTOLIN HFA) 108 (90 Base) MCG/ACT inhaler Inhale 2 puffs into the lungs every 4 (four) hours as needed for wheezing. 1 Inhaler 2  . budesonide-formoterol (SYMBICORT) 80-4.5 MCG/ACT inhaler Inhale 2 puffs into the lungs 2 (two) times daily. 3 Inhaler 3  . BYSTOLIC 20 MG TABS TAKE 1 TABLET BY MOUTH  DAILY 90 tablet 3  . Cholecalciferol (VITAMIN D3) 1000 units CAPS Take 1,000 Units by mouth daily.    Marland Kitchen losartan-hydrochlorothiazide (HYZAAR) 100-25 MG tablet TAKE 1 TABLET BY MOUTH  DAILY 90 tablet 3  . Multiple Vitamins-Minerals (CENTRUM SILVER ULTRA WOMENS PO) Take 1 tablet by mouth.    Marland Kitchen amoxicillin-clavulanate (AUGMENTIN) 875-125 MG tablet Take 1 tablet by mouth 2 (two) times daily. 20 tablet 0   No current facility-administered medications for this visit.     Allergies: Statins and Propoxyphene n-acetaminophen  Past Medical History:  Diagnosis Date  . Clotting disorder (HCC)    DVT  . 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 GERD (gastroesophageal reflux disease)   . Hyperlipidemia   . Hypertension   . Ocular migraine   . Pneumonia   . UTI (urinary tract infection) 10/10/13   E coli 70,000 colonies    Past Surgical History:  Procedure Laterality Date  . DVT     2-3 X ; on BCP with first  . ENDOVENOUS ABLATION SAPHENOUS VEIN W/  LASER  4742   complicated by DVT   . ENDOVENOUS ABLATION SAPHENOUS VEIN W/ LASER  2013   Dr Renaldo Reel  . no colonoscopy     SOC reviewed  . TONSILLECTOMY    . TUBAL LIGATION     G3 P3  . WISDOM TOOTH EXTRACTION      Family History  Problem Relation Age of Onset  . Arthritis Mother        OA  . Melanoma Mother 72       in situ  . Transient ischemic attack Mother        late 38s  . COPD Mother   . Cancer Father        renal cancer  . Diabetes Father   . Heart attack Father        in 75s  . Hypertension Father   . Asthma Father   . Hypertension Sister   . Asthma Sister   . Kidney disease Sister        ? from Celebrex  . Kidney failure Sister   . Breast cancer Sister   . Diabetes Brother   . Emphysema Paternal Grandfather   . Stroke Paternal Grandfather        in 70s  . Stroke Paternal Grandmother        in 4s  . Diabetes Paternal Grandmother   . Hypertension Paternal Grandmother   . Other Sister        Chron's  or UC  . Breast cancer Cousin   . Colon polyps Neg Hx   . Esophageal cancer Neg Hx   . Pancreatic cancer Neg Hx   . Rectal cancer Neg Hx   . Stomach cancer Neg Hx     Social History   Tobacco Use  . Smoking status: Former Smoker    Last attempt to quit: 10/14/1969    Years since quitting: 48.3  . Smokeless tobacco: Never Used  . Tobacco comment: Quit at age 32 (smoked x 2 years, up to 1 ppd)   Substance Use Topics  . Alcohol use: Yes    Alcohol/week: 1.2 oz    Types: 2 Glasses of wine per week    Comment: Red wine (2 glasses weekly or less)     Subjective:  Patient presents with concerns for UTI; complaining of strong urinary odor x 2 days, frequent urination; denies any fever or blood in the urine; "notes she knows her body and knows she has a UTI." Had UTI in early January 2019;  Also complaining of sore throat which started last night; no fever; exposure to someone with Strep Throat; daughter was diagnosed with strep throat on Friday;    Known history of white coat hypertension- under care of cardiology; Objective:  Vitals:   02/16/18 1016  BP: (!) 150/82  Pulse: 67  Temp: 98.5 F (36.9 C)  TempSrc: Oral  SpO2: 98%  Weight: 181 lb (82.1 kg)  Height: 5\' 3"  (1.6 m)    General: Well developed, well nourished, in no acute distress  Skin : Warm and dry.  Head: Normocephalic and atraumatic  Oropharynx: Pink, supple. No suspicious lesions  Neck: Supple without thyromegaly, adenopathy  Lungs: Respirations unlabored; clear to auscultation bilaterally without wheeze, rales, rhonchi  CVS exam: normal rate and regular rhythm.  Vessels: Symmetric bilaterally  Neurologic: Alert and oriented; speech intact; face symmetrical; moves all extremities well; CNII-XII intact without focal deficit   Assessment:  1. Dysuria     Plan:  Check U/A and urine culture; Rx for Augmentin 875 mg bid x 10 days; explained to patient that this would cover for Strep throat concerns as well; she is in agreement with treatment plan; increase fluids, rest and follow-up worse, no better;   No follow-ups on file.  Orders Placed This Encounter  Procedures  . Urine Culture    Standing Status:   Future    Standing Expiration Date:   02/16/2019    Requested Prescriptions   Signed Prescriptions Disp Refills  . amoxicillin-clavulanate (AUGMENTIN) 875-125 MG tablet 20 tablet 0    Sig: Take 1 tablet by mouth 2 (two) times daily.

## 2018-02-16 NOTE — Addendum Note (Signed)
Addended by: Marcina Millard on: 02/16/2018 03:04 PM   Modules accepted: Orders

## 2018-02-18 LAB — URINE CULTURE
MICRO NUMBER:: 90549325
SPECIMEN QUALITY: ADEQUATE

## 2018-03-10 ENCOUNTER — Ambulatory Visit: Payer: Medicare Other | Admitting: Family

## 2018-03-10 ENCOUNTER — Other Ambulatory Visit: Payer: Medicare Other

## 2018-03-10 ENCOUNTER — Encounter: Payer: Self-pay | Admitting: Family

## 2018-03-10 ENCOUNTER — Ambulatory Visit: Payer: Self-pay | Admitting: Internal Medicine

## 2018-03-10 VITALS — BP 152/82 | HR 53 | Temp 98.3°F | Ht 63.0 in | Wt 178.1 lb

## 2018-03-10 DIAGNOSIS — R300 Dysuria: Secondary | ICD-10-CM

## 2018-03-10 LAB — POC URINALSYSI DIPSTICK (AUTOMATED)
BILIRUBIN UA: NEGATIVE
Glucose, UA: NEGATIVE
KETONES UA: NEGATIVE
Nitrite, UA: NEGATIVE
Protein, UA: POSITIVE — AB
RBC UA: 3
SPEC GRAV UA: 1.02 (ref 1.010–1.025)
Urobilinogen, UA: 0.2 E.U./dL
pH, UA: 6 (ref 5.0–8.0)

## 2018-03-10 MED ORDER — NITROFURANTOIN MONOHYD MACRO 100 MG PO CAPS: 100.0000 mg | ORAL_CAPSULE | Freq: Two times a day (BID) | ORAL | 0 refills | Status: DC

## 2018-03-10 NOTE — Addendum Note (Signed)
Addended by: Marcina Millard on: 03/10/2018 11:35 AM   Modules accepted: Orders

## 2018-03-10 NOTE — Progress Notes (Signed)
Deborah [name] is a 72 y.o. female with the following history as recorded in EpicCare:  Patient Active Problem List   Diagnosis Date Noted  . Dysuria 10/20/2017  . Perioral dermatitis 06/26/2017  . Hyperglycemia 06/26/2017  . Facial rash 05/17/2017  . Osteopenia 12/15/2016  . Moderate single current episode of major depressive disorder ([place]) 09/10/2016  . Cough 09/10/2016  . SVT (supraventricular tachycardia) ([place]) 03/17/2014  . Asthma 07/29/2012  . Hyperlipidemia 07/19/2008  . Essential hypertension 07/19/2008  . DVT, HX OF 07/19/2008    Current Outpatient Medications  Medication Sig Dispense Refill  . albuterol (PROVENTIL HFA;VENTOLIN HFA) 108 (90 Base) MCG/ACT inhaler Inhale 2 puffs into the lungs every 4 (four) hours as needed for wheezing. 1 Inhaler 2  . amoxicillin-clavulanate (AUGMENTIN) 875-125 MG tablet Take 1 tablet by mouth 2 (two) times daily. 20 tablet 0  . budesonide-formoterol (SYMBICORT) 80-4.5 MCG/ACT inhaler Inhale 2 puffs into the lungs 2 (two) times daily. 3 Inhaler 3  . BYSTOLIC 20 MG TABS TAKE 1 TABLET BY MOUTH  DAILY 90 tablet 3  . Cholecalciferol (VITAMIN D3) 1000 units CAPS Take 1,000 Units by mouth daily.    . Cranberry-Vitamin C 15000-100 MG CAPS Take by mouth.    . losartan-hydrochlorothiazide (HYZAAR) 100-25 MG tablet TAKE 1 TABLET BY MOUTH  DAILY 90 tablet 3  . Multiple Vitamins-Minerals (CENTRUM SILVER ULTRA WOMENS PO) Take 1 tablet by mouth.    . nitrofurantoin, macrocrystal-monohydrate, (MACROBID) 100 MG capsule Take 1 capsule (100 mg total) by mouth 2 (two) times daily. 14 capsule 0   No current facility-administered medications for this visit.     Allergies: Statins and Propoxyphene n-acetaminophen  Past Medical History:  Diagnosis Date  . Clotting disorder (HCC)    DVT  . 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 GERD (gastroesophageal reflux disease)   . Hyperlipidemia   . Hypertension   . Ocular migraine   . Pneumonia   .  UTI (urinary tract infection) 10/10/13   E coli 70,000 colonies    Past Surgical History:  Procedure Laterality Date  . DVT     2-3 X ; on BCP with first  . ENDOVENOUS ABLATION SAPHENOUS VEIN W/ LASER  1937   complicated by DVT   . ENDOVENOUS ABLATION SAPHENOUS VEIN W/ LASER  2013   Dr Renaldo Reel  . no colonoscopy     SOC reviewed  . TONSILLECTOMY    . TUBAL LIGATION     G3 P3  . WISDOM TOOTH EXTRACTION      Family History  Problem Relation Age of Onset  . Arthritis Mother        OA  . Melanoma Mother 82       in situ  . Transient ischemic attack Mother        late 71s  . COPD Mother   . Cancer Father        renal cancer  . Diabetes Father   . Heart attack Father        in 44s  . Hypertension Father   . Asthma Father   . Hypertension Sister   . Asthma Sister   . Kidney disease Sister        ? from Celebrex  . Kidney failure Sister   . Breast cancer Sister   . Diabetes Brother   . Emphysema Paternal Grandfather   . Stroke Paternal Grandfather        in 66s  . Stroke  Paternal Grandmother        in 74s  . Diabetes Paternal Grandmother   . Hypertension Paternal Grandmother   . Other Sister        Chron's or UC  . Breast cancer Cousin   . Colon polyps Neg Hx   . Esophageal cancer Neg Hx   . Pancreatic cancer Neg Hx   . Rectal cancer Neg Hx   . Stomach cancer Neg Hx     Social History   Tobacco Use  . Smoking status: Former Smoker    Last attempt to quit: 10/14/1969    Years since quitting: 48.4  . Smokeless tobacco: Never Used  . Tobacco comment: Quit at age 22 (smoked x 2 years, up to 1 ppd)   Substance Use Topics  . Alcohol use: Yes    Alcohol/week: 1.2 oz    Types: 2 Glasses of wine per week    Comment: Red wine (2 glasses weekly or less)     Subjective:  1 day history of UTI; prone to recurrent UTIs; last treated with 10 days of Augmentin by me in early May; did feel that symptoms cleared completely; + burning with urination; + frequency; + blood  in urine this am; no back pain;      Objective:  Vitals:   03/10/18 1043  BP: (!) 152/82  Pulse: (!) 53  Temp: 98.3 F (36.8 C)  TempSrc: Oral  SpO2: 96%  Weight: 178 lb 1.9 oz (80.8 kg)  Height: 5\' 3"  (1.6 m)    General: Well developed, well nourished, in no acute distress  Skin : Warm and dry.  Head: Normocephalic and atraumatic  Eyes: Sclera and conjunctiva clear; pupils round and reactive to light; extraocular movements intact  Lungs: Respirations unlabored; clear to auscultation bilaterally without wheeze, rales, rhonchi  CVS exam: normal rate and regular rhythm.  Neurologic: Alert and oriented; speech intact; face symmetrical; moves all extremities well; CNII-XII intact without focal deficit   Assessment:  1. Dysuria     Plan:  ? If last infection cleared completely; update U/A and urine culture today; Rx for Macrobid 100 mg bid x 7 days; follow-up to be determined.  Patient will plan to drop off urine sample next week for repeat culture once antibiotics is clear to ensure complete resolution.   No follow-ups on file.  Orders Placed This Encounter  Procedures  . Urine Culture    Standing Status:   Future    Standing Expiration Date:   03/10/2019    Requested Prescriptions   Signed Prescriptions Disp Refills  . nitrofurantoin, macrocrystal-monohydrate, (MACROBID) 100 MG capsule 14 capsule 0    Sig: Take 1 capsule (100 mg total) by mouth 2 (two) times daily.

## 2018-03-10 NOTE — Telephone Encounter (Signed)
Pt. Reports she has recurrent UTI's. Reports her symptoms started yesterday - urgency, strong odor to urine, nausea and some "fogginess." Denies fever or hematuria. Request morning appointment - no availability with provider. Appointment made.  Reason for Disposition . Urinating more frequently than usual (i.e., frequency)  Answer Assessment - Initial Assessment Questions 1. SYMPTOM: "What's the main symptom you're concerned about?" (e.g., frequency, incontinence)     Urgency, strong smelling urine and some dizziness  2. ONSET: "When did the  ________  start?"     Started yesterday 3. PAIN: "Is there any pain?" If so, ask: "How bad is it?" (Scale: 1-10; mild, moderate, severe)     4 but a 8 with physical symptoms 4. CAUSE: "What do you think is causing the symptoms?"     UTI 5. OTHER SYMPTOMS: "Do you have any other symptoms?" (e.g., fever, flank pain, blood in urine, pain with urination)     Just nausea and some "fogginess" 6. PREGNANCY: "Is there any chance you are pregnant?" "When was your last menstrual period?"     No  Protocols used: URINARY [facility]

## 2018-03-11 LAB — URINE CULTURE
MICRO NUMBER:: 90640420
SPECIMEN QUALITY:: ADEQUATE

## 2018-03-12 ENCOUNTER — Other Ambulatory Visit: Payer: Self-pay | Admitting: Family

## 2018-03-12 DIAGNOSIS — N390 Urinary tract infection, site not specified: Secondary | ICD-10-CM

## 2018-03-19 ENCOUNTER — Encounter: Payer: Self-pay | Admitting: Family

## 2018-04-06 ENCOUNTER — Other Ambulatory Visit: Payer: Self-pay | Admitting: Obstetrics and Gynecology

## 2018-04-06 DIAGNOSIS — Z1231 Encounter for screening mammogram for malignant neoplasm of breast: Secondary | ICD-10-CM

## 2018-04-28 ENCOUNTER — Ambulatory Visit
Admission: RE | Admit: 2018-04-28 | Discharge: 2018-04-28 | Disposition: A | Discharge status: Home / Self Care | Payer: Medicare Other | Source: Ambulatory Visit | Attending: Obstetrics and Gynecology | Admitting: Obstetrics and Gynecology

## 2018-04-28 DIAGNOSIS — Z1231 Encounter for screening mammogram for malignant neoplasm of breast: Secondary | ICD-10-CM

## 2018-05-20 ENCOUNTER — Encounter: Payer: Self-pay | Admitting: Family

## 2018-05-22 ENCOUNTER — Other Ambulatory Visit: Payer: Self-pay | Admitting: *Deleted

## 2018-05-22 MED ORDER — LOSARTAN POTASSIUM-HCTZ 100-25 MG PO TABS: 1.0000 | ORAL_TABLET | Freq: Every day | ORAL | 1 refills | Status: DC

## 2018-07-13 DIAGNOSIS — N951 Menopausal and female climacteric states: Secondary | ICD-10-CM | POA: Diagnosis not present

## 2018-07-13 DIAGNOSIS — N952 Postmenopausal atrophic vaginitis: Secondary | ICD-10-CM | POA: Diagnosis not present

## 2018-07-20 DIAGNOSIS — I8311 Varicose veins of right lower extremity with inflammation: Secondary | ICD-10-CM | POA: Diagnosis not present

## 2018-07-20 DIAGNOSIS — I8312 Varicose veins of left lower extremity with inflammation: Secondary | ICD-10-CM | POA: Diagnosis not present

## 2018-08-25 ENCOUNTER — Encounter: Payer: Self-pay | Admitting: Internal Medicine

## 2018-08-25 NOTE — Telephone Encounter (Signed)
Documented flu shot.../LMB  

## 2018-08-28 ENCOUNTER — Other Ambulatory Visit: Payer: Self-pay | Admitting: *Deleted

## 2018-08-28 ENCOUNTER — Telehealth: Payer: Self-pay | Admitting: *Deleted

## 2018-08-28 ENCOUNTER — Telehealth: Payer: Self-pay

## 2018-08-28 MED ORDER — LOSARTAN POTASSIUM 100 MG PO TABS: 100.0000 mg | ORAL_TABLET | Freq: Every day | ORAL | 0 refills | Status: DC

## 2018-08-28 MED ORDER — HYDROCHLOROTHIAZIDE 25 MG PO TABS: 25.0000 mg | ORAL_TABLET | Freq: Every day | ORAL | 0 refills | Status: DC

## 2018-08-28 NOTE — Telephone Encounter (Signed)
Pt pharmacy states that Rx Losartan-HCTZ is on back order and would like a Rx for Losartan 100mg  and Rx HCTZ 25mg . Please. address. Thank you.

## 2018-08-28 NOTE — Telephone Encounter (Signed)
Spoke with patient and made her aware of change. She was hesitant at first due to the recalls on losartan and it being linked to cancer. I informed her that I could send a msg in regards to her concern but she said for me to go ahead and send in the two separate rx's and she would discuss this with Dr Tamala Julian at her next office visit. She is aware that I will send these in for her to requested pharmacy. She verbalized her understanding and appreciation.

## 2018-08-28 NOTE — Telephone Encounter (Signed)
Received fax from [facility] indicating that the losartan- hctz 100-25 mg has been recalled. They are requesting an alternative. Can I send in as two separate rx's, one for losartan 100 mg and one for hctz 25 mg? Please advise. Thanks, MI

## 2018-08-28 NOTE — Telephone Encounter (Signed)
Ok to send in as 2 Rx Please make sure pt understands that it will 2 Rx now.

## 2018-09-02 ENCOUNTER — Other Ambulatory Visit: Payer: Self-pay | Admitting: Interventional Cardiology

## 2018-09-02 NOTE — Telephone Encounter (Signed)
Pt's medication has already been sent to pt's pharmacy as requested. Confirmation received.  

## 2018-11-21 ENCOUNTER — Other Ambulatory Visit: Payer: Self-pay | Admitting: Interventional Cardiology

## 2019-01-07 ENCOUNTER — Telehealth: Payer: Self-pay | Admitting: Interventional Cardiology

## 2019-01-07 NOTE — Telephone Encounter (Signed)
Spoke with pt about appt with Dr. Tamala Julian scheduled on 01/18/2019.  Pt denies any cardiac issues.  Rescheduled pt for 05/05/2019.  Advised to contact office sooner if any issues.

## 2019-01-18 ENCOUNTER — Ambulatory Visit: Payer: Medicare Other | Admitting: Interventional Cardiology

## 2019-02-09 ENCOUNTER — Other Ambulatory Visit: Payer: Self-pay | Admitting: Interventional Cardiology

## 2019-03-31 ENCOUNTER — Other Ambulatory Visit: Payer: Self-pay | Admitting: Obstetrics and Gynecology

## 2019-03-31 DIAGNOSIS — Z1231 Encounter for screening mammogram for malignant neoplasm of breast: Secondary | ICD-10-CM

## 2019-04-28 ENCOUNTER — Telehealth: Payer: Self-pay

## 2019-04-28 NOTE — Telephone Encounter (Signed)
YOUR CARDIOLOGY TEAM HAS ARRANGED FOR AN E-VISIT FOR YOUR APPOINTMENT - PLEASE REVIEW IMPORTANT INFORMATION BELOW SEVERAL DAYS PRIOR TO YOUR APPOINTMENT  Due to the recent COVID-19 pandemic, we are transitioning in-person office visits to tele-medicine visits in an effort to decrease unnecessary exposure to our patients, their families, and staff. These visits are billed to your insurance just like a normal visit is. We also encourage you to sign up for MyChart if you have not already done so. You will need a smartphone if possible. For patients that do not have this, we can still complete the visit using a regular telephone but do prefer a smartphone to enable video when possible. You may have a family member that lives with you that can help. If possible, we also ask that you have a blood pressure cuff and scale at home to measure your blood pressure, heart rate and weight prior to your scheduled appointment. Patients with clinical needs that need an in-person evaluation and testing will still be able to come to the office if absolutely necessary. If you have any questions, feel free to call our office.     YOUR PROVIDER WILL BE USING THE FOLLOWING PLATFORM TO COMPLETE YOUR VISIT: Doximity  . IF USING MYCHART - How to Download the MyChart App to Your SmartPhone   - If Apple, go to App Store and type in MyChart in the search bar and download the app. If Android, ask patient to go to Google Play Store and type in MyChart in the search bar and download the app. The app is free but as with any other app downloads, your phone may require you to verify saved payment information or Apple/Android password.  - You will need to then log into the app with your MyChart username and password, and select [facility] as your healthcare provider to link the account.  - When it is time for your visit, go to the MyChart app, find appointments, and click Begin Video Visit. Be sure to Select Allow for your device to  access the Microphone and Camera for your visit. You will then be connected, and your provider will be with you shortly.  **If you have any issues connecting or need assistance, please contact MyChart service desk [phone] ([phone])**  **If using a computer, in order to ensure the best quality for your visit, you will need to use either of the following Internet Browsers: Google Chrome or Microsoft Edge**  . IF USING DOXIMITY or [email or web address] - The staff will give you instructions on receiving your link to join the meeting the day of your visit.      2-3 DAYS BEFORE YOUR APPOINTMENT  You will receive a telephone call from one of our [facility] team members - your caller ID may say "Unknown caller." If this is a video visit, we will walk you through how to get the video launched on your phone. We will remind you check your blood pressure, heart rate and weight prior to your scheduled appointment. If you have an Apple Watch or Kardia, please upload any pertinent ECG strips the day before or morning of your appointment to MyChart. Our staff will also make sure you have reviewed the consent and agree to move forward with your scheduled tele-health visit.     THE DAY OF YOUR APPOINTMENT  Approximately 15 minutes prior to your scheduled appointment, you will receive a telephone call from one of [facility] team - your caller ID may say "Unknown caller."    Our staff will confirm medications, vital signs for the day and any symptoms you may be experiencing. Please have this information available prior to the time of visit start. It may also be helpful for you to have a pad of paper and pen handy for any instructions given during your visit. They will also walk you through joining the smartphone meeting if this is a video visit.    CONSENT FOR TELE-HEALTH VISIT - PLEASE REVIEW  I hereby voluntarily request, consent and authorize [facility] and its employed or contracted physicians, physician  assistants, nurse practitioners or other licensed health care professionals (the Practitioner), to provide me with telemedicine health care services (the "Services") as deemed necessary by the treating Practitioner. I acknowledge and consent to receive the Services by the Practitioner via telemedicine. I understand that the telemedicine visit will involve communicating with the Practitioner through live audiovisual communication technology and the disclosure of certain medical information by electronic transmission. I acknowledge that I have been given the opportunity to request an in-person assessment or other available alternative prior to the telemedicine visit and am voluntarily participating in the telemedicine visit.  I understand that I have the right to withhold or withdraw my consent to the use of telemedicine in the course of my care at any time, without affecting my right to future care or treatment, and that the Practitioner or I may terminate the telemedicine visit at any time. I understand that I have the right to inspect all information obtained and/or recorded in the course of the telemedicine visit and may receive copies of available information for a reasonable fee.  I understand that some of the potential risks of receiving the Services via telemedicine include:  . Delay or interruption in medical evaluation due to technological equipment failure or disruption; . Information transmitted may not be sufficient (e.g. poor resolution of images) to allow for appropriate medical decision making by the Practitioner; and/or  . In rare instances, security protocols could fail, causing a breach of personal health information.  Furthermore, I acknowledge that it is my responsibility to provide information about my medical history, conditions and care that is complete and accurate to the best of my ability. I acknowledge that Practitioner's advice, recommendations, and/or decision may be based on  factors not within their control, such as incomplete or inaccurate data provided by me or distortions of diagnostic images or specimens that may result from electronic transmissions. I understand that the practice of medicine is not an exact science and that Practitioner makes no warranties or guarantees regarding treatment outcomes. I acknowledge that I will receive a copy of this consent concurrently upon execution via email to the email address I last provided but may also request a printed copy by calling the office of [facility].    I understand that my insurance will be billed for this visit.   I have read or had this consent read to me. . I understand the contents of this consent, which adequately explains the benefits and risks of the Services being provided via telemedicine.  . I have been provided ample opportunity to ask questions regarding this consent and the Services and have had my questions answered to my satisfaction. . I give my informed consent for the services to be provided through the use of telemedicine in my medical care  By participating in this telemedicine visit I agree to the above.  

## 2019-05-04 NOTE — Progress Notes (Deleted)
Error

## 2019-05-05 ENCOUNTER — Telehealth (INDEPENDENT_AMBULATORY_CARE_PROVIDER_SITE_OTHER): Payer: Medicare Other | Admitting: Interventional Cardiology

## 2019-05-05 ENCOUNTER — Other Ambulatory Visit: Payer: Self-pay

## 2019-05-05 VITALS — BP 128/74 | HR 58 | Ht 63.0 in | Wt 181.0 lb

## 2019-05-05 DIAGNOSIS — E7849 Other hyperlipidemia: Secondary | ICD-10-CM

## 2019-05-05 DIAGNOSIS — I10 Essential (primary) hypertension: Secondary | ICD-10-CM

## 2019-05-05 DIAGNOSIS — I471 Supraventricular tachycardia: Secondary | ICD-10-CM

## 2019-05-05 DIAGNOSIS — Z7189 Other specified counseling: Secondary | ICD-10-CM
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
## 2019-05-05 NOTE — Patient Instructions (Signed)

## 2019-05-13 ENCOUNTER — Other Ambulatory Visit: Payer: Self-pay

## 2019-05-13 ENCOUNTER — Ambulatory Visit
Admission: RE | Admit: 2019-05-13 | Discharge: 2019-05-13 | Disposition: A | Discharge status: Home / Self Care | Payer: Medicare Other | Source: Ambulatory Visit | Attending: Obstetrics and Gynecology | Admitting: Obstetrics and Gynecology

## 2019-05-13 DIAGNOSIS — Z1231 Encounter for screening mammogram for malignant neoplasm of breast: Secondary | ICD-10-CM

## 2019-05-13 NOTE — Patient Instructions (Addendum)
Tests ordered today. Your results will be released to K[place] (or called to you) after review.  If any changes need to be made, you will be notified at that same time.  All other Health Maintenance issues reviewed.   All recommended immunizations and age-appropriate screenings are up-to-date or discussed.  No immunization administered today.   Medications reviewed and updated.  Changes include :   none     Please followup in 1 year    Health Maintenance After Age 1 After age 75, you are at a higher risk for certain long-term diseases and infections as well as injuries from falls. Falls are a major cause of broken bones and head injuries in people who are older than age 78. Getting regular preventive care can help to keep you healthy and well. Preventive care includes getting regular testing and making lifestyle changes as recommended by your health care provider. Talk with your health care provider about:  Which screenings and tests you should have. A screening is a test that checks for a disease when you have no symptoms.  A diet and exercise plan that is right for you. What should I know about screenings and tests to prevent falls? Screening and testing are the best ways to find a health problem early. Early diagnosis and treatment give you the best chance of managing medical conditions that are common after age 57. Certain conditions and lifestyle choices may make you more likely to have a fall. Your health care provider may recommend:  Regular vision checks. Poor vision and conditions such as cataracts can make you more likely to have a fall. If you wear glasses, make sure to get your prescription updated if your vision changes.  Medicine review. Work with your health care provider to regularly review all of the medicines you are taking, including over-the-counter medicines. Ask your health care provider about any side effects that may make you more likely to have a fall. Tell your  health care provider if any medicines that you take make you feel dizzy or sleepy.  Osteoporosis screening. Osteoporosis is a condition that causes the bones to get weaker. This can make the bones weak and cause them to break more easily.  Blood pressure screening. Blood pressure changes and medicines to control blood pressure can make you feel dizzy.  Strength and balance checks. Your health care provider may recommend certain tests to check your strength and balance while standing, walking, or changing positions.  Foot health exam. Foot pain and numbness, as well as not wearing proper footwear, can make you more likely to have a fall.  Depression screening. You may be more likely to have a fall if you have a fear of falling, feel emotionally low, or feel unable to do activities that you used to do.  Alcohol use screening. Using too much alcohol can affect your balance and may make you more likely to have a fall. What actions can I take to lower my risk of falls? General instructions  Talk with your health care provider about your risks for falling. Tell your health care provider if: ? You fall. Be sure to tell your health care provider about all falls, even ones that seem minor. ? You feel dizzy, sleepy, or off-balance.  Take over-the-counter and prescription medicines only as told by your health care provider. These include any supplements.  Eat a healthy diet and maintain a healthy weight. A healthy diet includes low-fat dairy products, low-fat (lean) meats, and fiber from  whole grains, beans, and lots of fruits and vegetables. Home safety  Remove any tripping hazards, such as rugs, cords, and clutter.  Install safety equipment such as grab bars in bathrooms and safety rails on stairs.  Keep rooms and walkways well-lit. Activity   Follow a regular exercise program to stay fit. This will help you maintain your balance. Ask your health care provider what types of exercise are  appropriate for you.  If you need a cane or walker, use it as recommended by your health care provider.  Wear supportive shoes that have nonskid soles. Lifestyle  Do not drink alcohol if your health care provider tells you not to drink.  If you drink alcohol, limit how much you have: ? 0-1 drink a day for women. ? 0-2 drinks a day for men.  Be aware of how much alcohol is in your drink. In the [place] one drink equals one typical bottle of beer (12 oz), one-half glass of wine (5 oz), or one shot of hard liquor (1 oz).  Do not use any products that contain nicotine or tobacco, such as cigarettes and e-cigarettes. If you need help quitting, ask your health care provider. Summary  Having a healthy lifestyle and getting preventive care can help to protect your health and wellness after age 40.  Screening and testing are the best way to find a health problem early and help you avoid having a fall. Early diagnosis and treatment give you the best chance for managing medical conditions that are more common for people who are older than age 62.  Falls are a major cause of broken bones and head injuries in people who are older than age 28. Take precautions to prevent a fall at home.  Work with your health care provider to learn what changes you can make to improve your health and wellness and to prevent falls. This information is not intended to replace advice given to you by your health care provider. Make sure you discuss any questions you have with your health care provider. Document Released: 08/13/2017 Document Revised: 01/21/2019 Document Reviewed: 08/13/2017 Elsevier Patient Education  2020 [employer].

## 2019-05-13 NOTE — Progress Notes (Signed)
Subjective:    Patient ID: Deborah [name], female    DOB: 05-22-1946, 73 y.o.   MRN: [id number]  HPI She is here for a physical exam.   Her BP at home 116/60-124/65.  She does have whitecoat hypertension.  She denies any major changes in her health and has no concerns.  She had lost weight, but with the [place] pandemic she has gained weight again.  She is actively working on trying to lose it again.  Medications and allergies reviewed with patient and updated if appropriate.  Patient Active Problem List   Diagnosis Date Noted  . Perioral dermatitis 06/26/2017  . Hyperglycemia 06/26/2017  . Facial rash 05/17/2017  . Osteopenia 12/15/2016  . Moderate single current episode of major depressive disorder ([place]) 09/10/2016  . Cough 09/10/2016  . SVT (supraventricular tachycardia) ([place]) 03/17/2014  . Asthma 07/29/2012  . Hyperlipidemia 07/19/2008  . Essential hypertension 07/19/2008  . DVT, HX OF 07/19/2008    Current Outpatient Medications on File Prior to Visit  Medication Sig Dispense Refill  . albuterol (PROVENTIL HFA;VENTOLIN HFA) 108 (90 Base) MCG/ACT inhaler Inhale 2 puffs into the lungs every 4 (four) hours as needed for wheezing. 1 Inhaler 2  . Ascorbic Acid (VITAMIN C) 1000 MG tablet Take 1,000 mg by mouth daily.    Marland Kitchen BYSTOLIC 20 MG TABS TAKE 1 TABLET BY MOUTH  DAILY 90 tablet 1  . Cholecalciferol (VITAMIN D3) 1000 units CAPS Take 2,000 Units by mouth daily.     . Cranberry-Vitamin C 15000-100 MG CAPS Take by mouth.    . hydrochlorothiazide (HYDRODIURIL) 25 MG tablet TAKE 1 TABLET BY MOUTH EVERY DAY 90 tablet 0  . losartan (COZAAR) 100 MG tablet TAKE 1 TABLET BY MOUTH EVERY DAY 90 tablet 0  . Multiple Vitamins-Minerals (CENTRUM SILVER ULTRA WOMENS PO) Take 1 tablet by mouth.    . Zinc 50 MG TABS Take by mouth.     No current facility-administered medications on file prior to visit.     Past Medical History:  Diagnosis Date  . Clotting disorder (HCC)    DVT   . 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 GERD (gastroesophageal reflux disease)   . Hyperlipidemia   . Hypertension   . Ocular migraine   . Pneumonia   . UTI (urinary tract infection) 10/10/13   E coli 70,000 colonies    Past Surgical History:  Procedure Laterality Date  . DVT     2-3 X ; on BCP with first  . ENDOVENOUS ABLATION SAPHENOUS VEIN W/ LASER  8657   complicated by DVT   . ENDOVENOUS ABLATION SAPHENOUS VEIN W/ LASER  2013   Dr Renaldo Reel  . no colonoscopy     SOC reviewed  . TONSILLECTOMY    . TUBAL LIGATION     G3 P3  . WISDOM TOOTH EXTRACTION      Social History   Socioeconomic History  . Marital status: Married    Spouse name: Not on file  . Number of children: 3  . Years of education: Not on file  . Highest education level: Not on file  Occupational History  . Occupation: retired Animal nutritionist  . Financial resource strain: Not on file  . Food insecurity    Worry: Not on file    Inability: Not on file  . Transportation needs    Medical: Not on file    Non-medical: Not on file  Tobacco Use  .  Smoking status: Former Smoker    Quit date: 10/14/1969    Years since quitting: 49.6  . Smokeless tobacco: Never Used  . Tobacco comment: Quit at age 28 (smoked x 2 years, up to 1 ppd)   Substance and Sexual Activity  . Alcohol use: Yes    Alcohol/week: 2.0 standard drinks    Types: 2 Glasses of wine per week    Comment: Red wine (2 glasses weekly or less)   . Drug use: No  . Sexual activity: Not on file  Lifestyle  . Physical activity    Days per week: Not on file    Minutes per session: Not on file  . Stress: Not on file  Relationships  . Social Herbalist on phone: Not on file    Gets together: Not on file    Attends religious service: Not on file    Active member of club or organization: Not on file    Attends meetings of clubs or organizations: Not on file    Relationship status: Not on file  Other Topics Concern  . Not  on file  Social History Narrative  . Not on file    Family History  Problem Relation Age of Onset  . Arthritis Mother        OA  . Melanoma Mother 63       in situ  . Transient ischemic attack Mother        late 38s  . COPD Mother   . Cancer Father        renal cancer  . Diabetes Father   . Heart attack Father        in 22s  . Hypertension Father   . Asthma Father   . Hypertension Sister   . Asthma Sister   . Kidney disease Sister        ? from Celebrex  . Kidney failure Sister   . Breast cancer Sister   . Diabetes Brother   . Emphysema Paternal Grandfather   . Stroke Paternal Grandfather        in 69s  . Stroke Paternal Grandmother        in 74s  . Diabetes Paternal Grandmother   . Hypertension Paternal Grandmother   . Other Sister        Chron's or UC  . Breast cancer Cousin   . Colon polyps Neg Hx   . Esophageal cancer Neg Hx   . Pancreatic cancer Neg Hx   . Rectal cancer Neg Hx   . Stomach cancer Neg Hx     Review of Systems  Constitutional: Negative for chills and fever.  Eyes: Negative for visual disturbance.  Respiratory: Negative for cough, shortness of breath and wheezing.   Cardiovascular: Positive for leg swelling (with heat). Negative for chest pain and palpitations.  Gastrointestinal: Negative for abdominal pain, blood in stool, constipation, diarrhea and nausea.       No gerd  Genitourinary: Negative for dysuria and hematuria.  Musculoskeletal: Positive for back pain (first thing in the morning). Negative for arthralgias.  Skin: Negative for color change and rash ( ).  Neurological: Negative for light-headedness and headaches.  Psychiatric/Behavioral: Negative for dysphoric mood. The patient is not nervous/anxious.        Objective:   Vitals:   05/14/19 1106  BP: (!) 160/88  Pulse: (!) 59  Resp: 16  Temp: 98.8 F (37.1 C)  SpO2: 96%   Filed Weights  05/14/19 1106  Weight: 185 lb 6.4 oz (84.1 kg)   Body mass index is 32.84  kg/m.  BP Readings from Last 3 Encounters:  05/14/19 (!) 160/88  05/05/19 128/74  03/10/18 (!) 152/82    Wt Readings from Last 3 Encounters:  05/14/19 185 lb 6.4 oz (84.1 kg)  05/05/19 181 lb (82.1 kg)  03/10/18 178 lb 1.9 oz (80.8 kg)     Physical Exam Constitutional: She appears well-developed and well-nourished. No distress.  HENT:  Head: Normocephalic and atraumatic.  Right Ear: External ear normal. Normal ear canal and TM Left Ear: External ear normal.  Normal ear canal and TM Mouth/Throat: Oropharynx is clear and moist.  Eyes: Conjunctivae and EOM are normal.  Neck: Neck supple. No tracheal deviation present. No thyromegaly present.  No carotid bruit  Cardiovascular: Normal rate, regular rhythm and normal heart sounds.   No murmur heard.  No edema. Pulmonary/Chest: Effort normal and breath sounds normal. No respiratory distress. She has no wheezes. She has no rales.  Breast: deferred   Abdominal: Soft. She exhibits no distension. There is no tenderness.  Lymphadenopathy: She has no cervical adenopathy.  Skin: Skin is warm and dry. She is not diaphoretic.  Psychiatric: She has a normal mood and affect. Her behavior is normal.        Assessment & Plan:   Physical exam: Screening blood work ordered Immunizations discussed Shingrix, others up-to-date Colonoscopy    up-to-date Mammogram   Up to date Gyn  Not up to date - will schedule Dexa   due-  ordreed Eye exams   Up to date  Exercise  Walking 8000-10000 steps a day Weight   working on weight loss Skin   No concerns Substance abuse  none  See Problem List for Assessment and Plan of chronic medical problems.    FU in one year

## 2019-05-14 ENCOUNTER — Ambulatory Visit (INDEPENDENT_AMBULATORY_CARE_PROVIDER_SITE_OTHER): Payer: Medicare Other | Admitting: Internal Medicine

## 2019-05-14 ENCOUNTER — Encounter: Payer: Self-pay | Admitting: Internal Medicine

## 2019-05-14 VITALS — BP 160/88 | HR 59 | Temp 98.8°F | Resp 16 | Ht 63.0 in | Wt 185.4 lb

## 2019-05-14 DIAGNOSIS — R739 Hyperglycemia, unspecified: Secondary | ICD-10-CM

## 2019-05-14 DIAGNOSIS — M85851 Other specified disorders of bone density and structure, right thigh: Secondary | ICD-10-CM

## 2019-05-14 DIAGNOSIS — I10 Essential (primary) hypertension: Secondary | ICD-10-CM

## 2019-05-14 DIAGNOSIS — M85852 Other specified disorders of bone density and structure, left thigh: Secondary | ICD-10-CM

## 2019-05-14 DIAGNOSIS — Z0000 Encounter for general adult medical examination without abnormal findings: Secondary | ICD-10-CM

## 2019-05-14 DIAGNOSIS — J4521 Mild intermittent asthma with (acute) exacerbation: Secondary | ICD-10-CM | POA: Diagnosis not present

## 2019-05-14 DIAGNOSIS — E7849 Other hyperlipidemia: Secondary | ICD-10-CM | POA: Diagnosis not present

## 2019-05-14 NOTE — Assessment & Plan Note (Signed)
Currently well controlled Continue albuterol as needed

## 2019-05-14 NOTE — Assessment & Plan Note (Signed)
Diet controlled Check lipid panel, TSH, CMP Continue healthy diet and regular exercise Working on weight loss

## 2019-05-14 NOTE — Assessment & Plan Note (Signed)
A1c

## 2019-05-14 NOTE — Assessment & Plan Note (Signed)
DEXA due-ordered Continue walking Continue vitamin D, multivitamin

## 2019-05-14 NOTE — Assessment & Plan Note (Signed)
Known whitecoat hypertension Blood pressure very well controlled at home and she monitors it regularly Continue current medication at current doses CMP

## 2019-05-19 ENCOUNTER — Ambulatory Visit (INDEPENDENT_AMBULATORY_CARE_PROVIDER_SITE_OTHER)
Admission: RE | Admit: 2019-05-19 | Discharge: 2019-05-19 | Disposition: A | Discharge status: Home / Self Care | Payer: Medicare Other | Source: Ambulatory Visit | Attending: Internal Medicine | Admitting: Internal Medicine

## 2019-05-19 ENCOUNTER — Other Ambulatory Visit: Payer: Self-pay

## 2019-05-19 ENCOUNTER — Other Ambulatory Visit: Payer: Self-pay | Admitting: Interventional Cardiology

## 2019-05-19 ENCOUNTER — Other Ambulatory Visit (INDEPENDENT_AMBULATORY_CARE_PROVIDER_SITE_OTHER): Payer: Medicare Other

## 2019-05-19 DIAGNOSIS — R739 Hyperglycemia, unspecified: Secondary | ICD-10-CM | POA: Diagnosis not present

## 2019-05-19 DIAGNOSIS — M85852 Other specified disorders of bone density and structure, left thigh: Secondary | ICD-10-CM | POA: Diagnosis not present

## 2019-05-19 DIAGNOSIS — I10 Essential (primary) hypertension: Secondary | ICD-10-CM | POA: Diagnosis not present

## 2019-05-19 DIAGNOSIS — M85851 Other specified disorders of bone density and structure, right thigh: Secondary | ICD-10-CM | POA: Diagnosis not present

## 2019-05-19 DIAGNOSIS — E7849 Other hyperlipidemia: Secondary | ICD-10-CM

## 2019-05-19 DIAGNOSIS — Z0000 Encounter for general adult medical examination without abnormal findings: Secondary | ICD-10-CM | POA: Diagnosis not present

## 2019-05-19 LAB — CBC WITH DIFFERENTIAL/PLATELET
Basophils Absolute: 0 10*3/uL (ref 0.0–0.1)
Basophils Relative: 0.6 % (ref 0.0–3.0)
Eosinophils Absolute: 0.3 10*3/uL (ref 0.0–0.7)
Eosinophils Relative: 3.7 % (ref 0.0–5.0)
HCT: 46.6 % — ABNORMAL HIGH (ref 36.0–46.0)
Hemoglobin: 15.8 g/dL — ABNORMAL HIGH (ref 12.0–15.0)
Lymphocytes Relative: 30.5 % (ref 12.0–46.0)
Lymphs Abs: 2.1 10*3/uL (ref 0.7–4.0)
MCHC: 33.8 g/dL (ref 30.0–36.0)
MCV: 88.8 fl (ref 78.0–100.0)
Monocytes Absolute: 0.5 10*3/uL (ref 0.1–1.0)
Monocytes Relative: 7.2 % (ref 3.0–12.0)
Neutro Abs: 4 10*3/uL (ref 1.4–7.7)
Neutrophils Relative %: 58 % (ref 43.0–77.0)
Platelets: 264 10*3/uL (ref 150.0–400.0)
RBC: 5.25 Mil/uL — ABNORMAL HIGH (ref 3.87–5.11)
RDW: 12.1 % (ref 11.5–15.5)
WBC: 6.8 10*3/uL (ref 4.0–10.5)

## 2019-05-19 LAB — COMPREHENSIVE METABOLIC PANEL
ALT: 16 U/L (ref 0–35)
AST: 17 U/L (ref 0–37)
Albumin: 4.7 g/dL (ref 3.5–5.2)
Alkaline Phosphatase: 55 U/L (ref 39–117)
BUN: 25 mg/dL — ABNORMAL HIGH (ref 6–23)
CO2: 26 mEq/L (ref 19–32)
Calcium: 9.9 mg/dL (ref 8.4–10.5)
Chloride: 102 mEq/L (ref 96–112)
Creatinine, Ser: 0.98 mg/dL (ref 0.40–1.20)
GFR: 55.78 mL/min — ABNORMAL LOW (ref >60.00)
Glucose, Bld: 99 mg/dL (ref 70–99)
Potassium: 3.5 mEq/L (ref 3.5–5.1)
Sodium: 140 mEq/L (ref 135–145)
Total Bilirubin: 0.7 mg/dL (ref 0.2–1.2)
Total Protein: 7.6 g/dL (ref 6.0–8.3)

## 2019-05-19 LAB — LIPID PANEL
Cholesterol: 232 mg/dL — ABNORMAL HIGH (ref 0–200)
HDL: 65.4 mg/dL (ref >39.00)
LDL Cholesterol: 147 mg/dL — ABNORMAL HIGH (ref 0–99)
NonHDL: 166.31
Total CHOL/HDL Ratio: 4
Triglycerides: 99 mg/dL (ref 0.0–149.0)
VLDL: 19.8 mg/dL (ref 0.0–40.0)

## 2019-05-19 LAB — HEMOGLOBIN A1C: Hgb A1c MFr Bld: 5.4 % (ref 4.6–6.5)

## 2019-05-19 LAB — TSH: TSH: 3.18 u[IU]/mL (ref 0.35–4.50)

## 2019-05-20 ENCOUNTER — Encounter: Payer: Self-pay | Admitting: Internal Medicine

## 2019-05-20 DIAGNOSIS — D751 Secondary polycythemia: Secondary | ICD-10-CM

## 2019-05-20 DIAGNOSIS — E7849 Other hyperlipidemia: Secondary | ICD-10-CM

## 2019-06-04 ENCOUNTER — Other Ambulatory Visit: Payer: Self-pay | Admitting: Interventional Cardiology

## 2019-06-26 ENCOUNTER — Telehealth: Payer: Medicare Other | Admitting: Family

## 2019-06-26 ENCOUNTER — Ambulatory Visit (INDEPENDENT_AMBULATORY_CARE_PROVIDER_SITE_OTHER)
Admission: RE | Admit: 2019-06-26 | Discharge: 2019-06-26 | Disposition: A | Discharge status: Home / Self Care | Payer: Medicare Other | Source: Ambulatory Visit

## 2019-06-26 DIAGNOSIS — R300 Dysuria: Secondary | ICD-10-CM

## 2019-06-26 DIAGNOSIS — N390 Urinary tract infection, site not specified: Secondary | ICD-10-CM

## 2019-06-26 MED ORDER — CEPHALEXIN 500 MG PO CAPS: 500.0000 mg | ORAL_CAPSULE | Freq: Two times a day (BID) | ORAL | 0 refills | Status: DC

## 2019-06-26 NOTE — Discharge Instructions (Signed)
Take the antibiotic as prescribed twice a day.   Follow up with your doctor in 3-4 days if your symptoms are not improving;  sooner if you are getting worse.

## 2019-06-26 NOTE — ED Provider Notes (Signed)
Virtual Visit via Video Note:  Deborah [name] [facility]  initiated request for Telemedicine visit with [facility] team. I connected with Deborah [name]  on 06/26/2019 at 10:31 AM  for a synchronized telemedicine visit using a video enabled HIPPA compliant telemedicine application. I verified that I am speaking with Deborah [name]  using two identifiers. Sharion Balloon, NP  was physically located in a [facility] Urgent care site and [name] was located at a different location.   The limitations of evaluation and management by telemedicine as well as the availability of in-person appointments were discussed. Patient was informed that she  may incur a bill ( including co-pay) for this virtual visit encounter. So Freyre [name]  expressed understanding and gave verbal consent to proceed with virtual visit.     History of Present Illness:Deborah [name]  is a 73 y.o. female presents for evaluation of urinary frequency and urgency x 1 day.  Patient has a history of UTIs, last one in 2019, and states this feels like her usual symptoms.  She denies fever, chills, abdominal pain, back/flank pain, vaginal symptoms.     Allergies  Allergen Reactions  . Statins     Muscle aches with some, elevated lft with crestor  . Propoxyphene N-Acetaminophen     REACTION: GI Upset     Past Medical History:  Diagnosis Date  . Clotting disorder (HCC)    DVT  . 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 GERD (gastroesophageal reflux disease)   . Hyperlipidemia   . Hypertension   . Ocular migraine   . Pneumonia   . UTI (urinary tract infection) 10/10/13   E coli 70,000 colonies     Social History   Tobacco Use  . Smoking status: Former Smoker    Quit date: 10/14/1969    Years since quitting: 49.7  . Smokeless tobacco: Never Used  . Tobacco comment: Quit at age 88 (smoked x 2 years, up to 1 ppd)   Substance Use Topics  . Alcohol use: Yes   Alcohol/week: 2.0 standard drinks    Types: 2 Glasses of wine per week    Comment: Red wine (2 glasses weekly or less)   . Drug use: No        Observations/Objective: Physical Exam  VITALS:  Per patient: temp 97.5, blood pressure 120/76. GENERAL: Alert, appears well and in no acute distress. HEENT: Atraumatic, no obvious abnormalities NECK: Normal movements of the head and neck. CARDIOPULMONARY: No increased WOB. Speaking in clear sentences. I:E ratio WNL.  PSYCH: Pleasant and cooperative, well-groomed. Speech normal rate and rhythm. Affect is appropriate. Insight and judgement are appropriate. Attention is focused, linear, and appropriate.  NEURO: CN grossly intact. Oriented as arrived to appointment on time with no prompting. Moves both UE equally.     Assessment and Plan:    ICD-10-CM   1. Urinary tract infection without hematuria, site unspecified  N39.0        Follow Up Instructions:  Treating with Keflex BID x 7 days.  Instructed patient to follow up with her PCP if her symptoms persist or worsen.  Instructed her to be seen in person if she develops fever, chills, abdominal pain, back or flank pain, or other concerning symptoms.  Patient agrees to [place].      I discussed the assessment and treatment plan with the patient. The patient was provided an opportunity to ask questions and all were answered.  The patient agreed with the plan and demonstrated an understanding of the instructions.   The patient was advised to call back or seek an in-person evaluation if the symptoms worsen or if the condition fails to improve as anticipated.      Sharion Balloon, NP  06/26/2019 10:31 AM         Sharion Balloon, NP 06/26/19 1031

## 2019-06-26 NOTE — Progress Notes (Signed)
We are sorry that you are not feeling well.  Here is how we plan to help!  Based on what you shared with me it looks like you most likely have a simple urinary tract infection.  A UTI (Urinary Tract Infection) is a bacterial infection of the bladder.  Most cases of urinary tract infections are simple to treat but a key part of your care is to encourage you to drink plenty of fluids and watch your symptoms carefully.  I have prescribed Keflex 500 mg twice a day for 7 days.  Your symptoms should gradually improve. Call us if the burning in your urine worsens, you develop worsening fever, back pain or pelvic pain or if your symptoms do not resolve after completing the antibiotic.  Urinary tract infections can be prevented by drinking plenty of water to keep your body hydrated.  Also be sure when you wipe, wipe from front to back and don't hold it in!  If possible, empty your bladder every 4 hours.  Your e-visit answers were reviewed by a board certified advanced clinical practitioner to complete your personal care plan.  Depending on the condition, your plan could have included both over the counter or prescription medications.  If there is a problem please reply  once you have received a response from your provider.  Your safety is important to us.  If you have drug allergies check your prescription carefully.    You can use MyChart to ask questions about today's visit, request a non-urgent call back, or ask for a work or school excuse for 24 hours related to this e-Visit. If it has been greater than 24 hours you will need to follow up with your provider, or enter a new e-Visit to address those concerns.   You will get an e-mail in the next two days asking about your experience.  I hope that your e-visit has been valuable and will speed your recovery. Thank you for using e-visits.   Greater than 5 minutes, yet less than 10 minutes of time have been spent researching, coordinating, and  implementing care for this patient today.  Thank you for the details you included in the comment boxes. Those details are very helpful in determining the best course of treatment for you and help us to provide the best care.   

## 2019-06-30 ENCOUNTER — Encounter: Payer: Self-pay | Admitting: Internal Medicine

## 2019-06-30 ENCOUNTER — Other Ambulatory Visit (INDEPENDENT_AMBULATORY_CARE_PROVIDER_SITE_OTHER): Payer: Medicare Other

## 2019-06-30 DIAGNOSIS — E7849 Other hyperlipidemia: Secondary | ICD-10-CM | POA: Diagnosis not present

## 2019-06-30 DIAGNOSIS — D751 Secondary polycythemia: Secondary | ICD-10-CM

## 2019-06-30 LAB — CBC WITH DIFFERENTIAL/PLATELET
Basophils Absolute: 0 10*3/uL (ref 0.0–0.1)
Basophils Relative: 0.5 % (ref 0.0–3.0)
Eosinophils Absolute: 0.3 10*3/uL (ref 0.0–0.7)
Eosinophils Relative: 4.8 % (ref 0.0–5.0)
HCT: 45.7 % (ref 36.0–46.0)
Hemoglobin: 15.3 g/dL — ABNORMAL HIGH (ref 12.0–15.0)
Lymphocytes Relative: 23.6 % (ref 12.0–46.0)
Lymphs Abs: 1.6 10*3/uL (ref 0.7–4.0)
MCHC: 33.5 g/dL (ref 30.0–36.0)
MCV: 88.8 fl (ref 78.0–100.0)
Monocytes Absolute: 0.5 10*3/uL (ref 0.1–1.0)
Monocytes Relative: 7.1 % (ref 3.0–12.0)
Neutro Abs: 4.4 10*3/uL (ref 1.4–7.7)
Neutrophils Relative %: 64 % (ref 43.0–77.0)
Platelets: 261 10*3/uL (ref 150.0–400.0)
RBC: 5.15 Mil/uL — ABNORMAL HIGH (ref 3.87–5.11)
RDW: 12.6 % (ref 11.5–15.5)
WBC: 7 10*3/uL (ref 4.0–10.5)

## 2019-06-30 LAB — LIPID PANEL
Cholesterol: 194 mg/dL (ref 0–200)
HDL: 61.4 mg/dL (ref >39.00)
LDL Cholesterol: 113 mg/dL — ABNORMAL HIGH (ref 0–99)
NonHDL: 132.26
Total CHOL/HDL Ratio: 3
Triglycerides: 98 mg/dL (ref 0.0–149.0)
VLDL: 19.6 mg/dL (ref 0.0–40.0)

## 2019-07-01 DIAGNOSIS — I8311 Varicose veins of right lower extremity with inflammation: Secondary | ICD-10-CM | POA: Diagnosis not present

## 2019-07-01 DIAGNOSIS — I8312 Varicose veins of left lower extremity with inflammation: Secondary | ICD-10-CM | POA: Diagnosis not present

## 2019-07-01 DIAGNOSIS — I83813 Varicose veins of bilateral lower extremities with pain: Secondary | ICD-10-CM | POA: Diagnosis not present

## 2019-07-11 ENCOUNTER — Other Ambulatory Visit: Payer: Self-pay | Admitting: Interventional Cardiology

## 2019-07-13 DIAGNOSIS — I8311 Varicose veins of right lower extremity with inflammation: Secondary | ICD-10-CM | POA: Diagnosis not present

## 2019-07-13 DIAGNOSIS — I8312 Varicose veins of left lower extremity with inflammation: Secondary | ICD-10-CM | POA: Diagnosis not present

## 2019-07-30 DIAGNOSIS — Z124 Encounter for screening for malignant neoplasm of cervix: Secondary | ICD-10-CM | POA: Diagnosis not present

## 2019-08-12 DIAGNOSIS — I83811 Varicose veins of right lower extremities with pain: Secondary | ICD-10-CM | POA: Diagnosis not present

## 2019-08-12 DIAGNOSIS — I8311 Varicose veins of right lower extremity with inflammation: Secondary | ICD-10-CM | POA: Diagnosis not present

## 2019-09-01 DIAGNOSIS — I8311 Varicose veins of right lower extremity with inflammation: Secondary | ICD-10-CM | POA: Diagnosis not present

## 2019-09-01 DIAGNOSIS — I83891 Varicose veins of right lower extremities with other complications: Secondary | ICD-10-CM | POA: Diagnosis not present

## 2019-09-01 DIAGNOSIS — M7981 Nontraumatic hematoma of soft tissue: Secondary | ICD-10-CM | POA: Diagnosis not present

## 2019-09-15 DIAGNOSIS — I8312 Varicose veins of left lower extremity with inflammation: Secondary | ICD-10-CM | POA: Diagnosis not present

## 2019-09-15 DIAGNOSIS — I83812 Varicose veins of left lower extremities with pain: Secondary | ICD-10-CM | POA: Diagnosis not present

## 2020-01-27 ENCOUNTER — Ambulatory Visit (INDEPENDENT_AMBULATORY_CARE_PROVIDER_SITE_OTHER): Payer: Medicare Other

## 2020-01-27 VITALS — BP 130/80 | Ht 63.0 in

## 2020-01-27 DIAGNOSIS — Z0000 Encounter for general adult medical examination without abnormal findings: Secondary | ICD-10-CM | POA: Diagnosis not present

## 2020-01-27 NOTE — Progress Notes (Addendum)
This visit is being conducted via phone call due to the COVID-19 pandemic. This patient has given me verbal consent via phone to conduct this visit, patient states they are participating from their home address. Some vital signs may be absent or patient reported.   Patient identification: identified by name, DOB, and current address.  Location provider: [facility], Office Persons participating in the virtual visit: patient    Subjective:   Deborah [name] is a 74 y.o. female who presents for Medicare Annual (Subsequent) preventive examination.  Review of Systems:  No ROS. Medicare Wellness Visit Cardiac Risk Factors include: advanced age (>24men, >29 women);hypertension;dyslipidemia  Sleep Patterns: No issues with falling sleep; feels rested after 8-9 hours of sleep. Home Safety/Smoke Alarms: Feels safe in home; Smoke alarms in place. Living environment: 2-story home; Lives with her husband, daughter and two grandchildren. Seat Belt Safety/Bike Helmet: Wears seat belt.     Objective:     Vitals: BP 130/80   Ht 5\' 3"  (1.6 m)   BMI 32.84 kg/m   Body mass index is 32.84 kg/m.  Advanced Directives 01/27/2020 09/07/2017  Does Patient Have a Medical Advance Directive? Yes No  Type of Paramedic of [place];Living will -  Does patient want to make changes to medical advance directive? No - Patient declined -  Copy of [place] in Chart? No - copy requested -  Would patient like information on creating a medical advance directive? - No - Patient declined    Tobacco Social History   Tobacco Use  Smoking Status Former Smoker  . Quit date: 10/14/1969  . Years since quitting: 50.3  Smokeless Tobacco Never Used  Tobacco Comment   Quit at age 15 (smoked x 2 years, up to 1 ppd)      Counseling given: No Comment: Quit at age 33 (smoked x 2 years, up to 1 ppd)    Clinical Intake:  Pre-visit preparation completed: Yes  Pain :  No/denies pain Pain Score: 0-No pain     Nutritional Risks: None Diabetes: No  How often do you need to have someone help you when you read instructions, pamphlets, or other written materials from your doctor or pharmacy?: 1 - Never What is the last grade level you completed in school?: [place], Retired Architect Needed?: No  Information entered by :: [employer]. Lowell [employer], LPN  Past Medical History:  Diagnosis Date  . Clotting disorder (HCC)    DVT  . 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 GERD (gastroesophageal reflux disease)   . Hyperlipidemia   . Hypertension   . Ocular migraine   . Pneumonia   . UTI (urinary tract infection) 10/10/13   E coli 70,000 colonies   Past Surgical History:  Procedure Laterality Date  . DVT     2-3 X ; on BCP with first  . ENDOVENOUS ABLATION SAPHENOUS VEIN W/ LASER  123456   complicated by DVT   . ENDOVENOUS ABLATION SAPHENOUS VEIN W/ LASER  2013   Dr Renaldo Reel  . no colonoscopy     SOC reviewed  . TONSILLECTOMY    . TUBAL LIGATION     G3 P3  . WISDOM TOOTH EXTRACTION     Family History  Problem Relation Age of Onset  . Arthritis Mother        OA  . Melanoma Mother 63       in situ  . Transient ischemic  attack Mother        late 33s  . COPD Mother   . Cancer Father        renal cancer  . Diabetes Father   . Heart attack Father        in 23s  . Hypertension Father   . Asthma Father   . Hypertension Sister   . Asthma Sister   . Kidney disease Sister        ? from Celebrex  . Kidney failure Sister   . Breast cancer Sister   . Diabetes Brother   . Emphysema Paternal Grandfather   . Stroke Paternal Grandfather        in 60s  . Stroke Paternal Grandmother        in 14s  . Diabetes Paternal Grandmother   . Hypertension Paternal Grandmother   . Other Sister        Chron's or UC  . Breast cancer Cousin   . Colon polyps Neg Hx   . Esophageal cancer Neg Hx   . Pancreatic cancer Neg Hx   . Rectal cancer  Neg Hx   . Stomach cancer Neg Hx    Social History   Socioeconomic History  . Marital status: Married    Spouse name: Not on file  . Number of children: 3  . Years of education: Not on file  . Highest education level: Not on file  Occupational History  . Occupation: retired Therapist, sports  Tobacco Use  . Smoking status: Former Smoker    Quit date: 10/14/1969    Years since quitting: 50.3  . Smokeless tobacco: Never Used  . Tobacco comment: Quit at age 46 (smoked x 2 years, up to 1 ppd)   Substance and Sexual Activity  . Alcohol use: Yes    Alcohol/week: 2.0 standard drinks    Types: 2 Glasses of wine per week    Comment: Red wine (2 glasses weekly or less)   . Drug use: No  . Sexual activity: Not on file  Other Topics Concern  . Not on file  Social History Narrative  . Not on file   Social Determinants of Health   Financial Resource Strain:   . Difficulty of Paying Living Expenses:   Food Insecurity:   . Worried About Charity fundraiser in the Last Year:   . Arboriculturist in the Last Year:   Transportation Needs:   . Film/video editor (Medical):   Marland Kitchen Lack of Transportation (Non-Medical):   Physical Activity:   . Days of Exercise per Week:   . Minutes of Exercise per Session:   Stress:   . Feeling of Stress :   Social Connections:   . Frequency of Communication with Friends and Family:   . Frequency of Social Gatherings with Friends and Family:   . Attends Religious Services:   . Active Member of Clubs or Organizations:   . Attends Archivist Meetings:   Marland Kitchen Marital Status:     Outpatient Encounter Medications as of 01/27/2020  Medication Sig  . albuterol (PROVENTIL HFA;VENTOLIN HFA) 108 (90 Base) MCG/ACT inhaler Inhale 2 puffs into the lungs every 4 (four) hours as needed for wheezing.  . Ascorbic Acid (VITAMIN C) 1000 MG tablet Take 1,000 mg by mouth daily.  Marland Kitchen BYSTOLIC 20 MG TABS TAKE 1 TABLET BY MOUTH  DAILY  . Cholecalciferol (VITAMIN D3) 1000 units CAPS  Take 2,000 Units by mouth daily.   . Cranberry-Vitamin  C 15000-100 MG CAPS Take by mouth.  . hydrochlorothiazide (HYDRODIURIL) 25 MG tablet TAKE 1 TABLET BY MOUTH AT BEDTIME  . losartan (COZAAR) 100 MG tablet TAKE 1 TABLET BY MOUTH EVERY DAY  . Multiple Vitamins-Minerals (CENTRUM SILVER ULTRA WOMENS PO) Take 1 tablet by mouth.  . Zinc 50 MG TABS Take by mouth.  . cephALEXin (KEFLEX) 500 MG capsule Take 1 capsule (500 mg total) by mouth 2 (two) times daily. (Patient not taking: Reported on 01/27/2020)   No facility-administered encounter medications on file as of 01/27/2020.    Activities of Daily Living In your present state of health, do you have any difficulty performing the following activities: 01/27/2020  Hearing? N  Vision? N  Difficulty concentrating or making decisions? Y  Comment sometimes  Walking or climbing stairs? N  Dressing or bathing? N  Doing errands, shopping? N  Preparing Food and eating ? N  Using the Toilet? N  In the past six months, have you accidently leaked urine? Y  Comment wears a pad  Do you have problems with loss of bowel control? N  Managing your Medications? N  Managing your Finances? N  Housekeeping or managing your Housekeeping? N  Some recent data might be hidden    Patient Care Team: Binnie Rail, MD as PCP - General (Internal Medicine)    Assessment:   This is a routine wellness examination for [place].  Exercise Activities and Dietary recommendations Current Exercise Habits: Home exercise routine, Type of exercise: walking(gets 8,000-10,000 steps per day), Time (Minutes): 30, Frequency (Times/Week): 7, Weekly Exercise (Minutes/Week): 210, Intensity: Moderate, Exercise limited by: None identified  Goals    . Client understands the importance of follow-up with providers by attending scheduled visits    . Patient Stated     Like to lose 20 pounds and continue to stay active.       Fall Risk Fall Risk  01/27/2020 03/10/2018 03/10/2018  01/24/2016 10/25/2013  Falls in the past year? 0 No No No Yes  Number falls in past yr: 0 - - - 1  Injury with Fall? 0 - - - No  Comment twisted her ankle - - - -  Risk for fall due to : No Fall Risks - - - -  Follow up Falls evaluation completed;Education provided - - - -   Is the patient's home free of loose throw rugs in walkways, pet beds, electrical cords, etc?   yes      Grab bars in the bathroom? yes      Handrails on the stairs?   yes      Adequate lighting?   yes    Depression Screen PHQ 2/9 Scores 01/27/2020 01/24/2016 10/25/2013  PHQ - 2 Score 0 0 0     Cognitive Function     6CIT Screen 01/27/2020  What Year? 0 points  What month? 0 points  What time? 0 points  Count back from 20 0 points  Months in reverse 0 points  Repeat phrase 0 points  Total Score 0    Immunization History  Administered Date(s) Administered  . Influenza Split 07/29/2012  . Influenza Whole 07/19/2008  . Influenza, High Dose Seasonal PF 06/26/2017, 08/02/2018  . Influenza,inj,Quad PF,6+ Mos 08/10/2013  . Influenza-Unspecified 07/31/2014, 08/11/2015  . Pneumococcal Conjugate-13 10/20/2015  . Pneumococcal Polysaccharide-23 10/25/2013  . Tdap 04/02/2011    Qualifies for Shingles Vaccine? Yes,  Patient will call insurance company to determine out of pocket expense for the Shingrix vaccine.  She may receive this vaccine at her local pharmacy.   Screening Tests Health Maintenance  Topic Date Due  . INFLUENZA VACCINE  05/14/2020  . TETANUS/TDAP  04/01/2021  . MAMMOGRAM  05/12/2021  . DEXA SCAN  05/18/2021  . COLONOSCOPY  03/05/2026  . Hepatitis C Screening  Completed  . PNA vac Low Risk Adult  Completed    Cancer Screenings: Lung: Low Dose CT Chest recommended if Age 53-80 years, 30 pack-year currently smoking OR have quit w/in 15years. Patient does not qualify. Breast:  Up to date on Mammogram? Yes   Up to date of Bone Density/Dexa? Yes Colorectal: Yes     Plan:    Reviewed  health maintenance screenings with patient today and relevant education, vaccines, and/or referrals were provided.    Continue doing brain stimulating activities (puzzles, reading, adult coloring books, staying active) to keep memory sharp.    Continue to eat heart healthy diet (full of fruits, vegetables, whole grains, lean protein, water--limit salt, fat, and sugar intake) and increase physical activity as tolerated.  I have personally reviewed and noted the following in the patient's chart:   . Medical and social history . Use of alcohol, tobacco or illicit drugs  . Current medications and supplements . Functional ability and status . Nutritional status . Physical activity . Advanced directives . List of other physicians . Hospitalizations, surgeries, and ER visits in previous 12 months . Vitals . Screenings to include cognitive, depression, and falls . Referrals and appointments  In addition, I have reviewed and discussed with patient certain preventive protocols, quality metrics, and best practice recommendations. A written personalized care plan for preventive services as well as general preventive health recommendations were provided to patient.     Sheral Flow, LPN  [id number]  Nurse Health Advisor  Medical screening examination/treatment/procedure(s) were performed by non-physician practitioner and as supervising physician I was immediately available for consultation/collaboration. I agree with above. Scarlette Calico, MD

## 2020-01-27 NOTE — Patient Instructions (Signed)
Deborah [name] , Thank you for taking time to come for your Medicare Wellness Visit. I appreciate your ongoing commitment to your health goals. Please review the following plan we discussed and let me know if I can assist you in the future.   Screening recommendations/referrals: Colorectal Screening: 03/05/2016 Mammogram: 05/13/2019 Bone Density: 05/19/2019  Vision and Dental Exams: Recommended annual ophthalmology exams for early detection of glaucoma and other disorders of the eye Recommended annual dental exams for proper oral hygiene  Vaccinations: Influenza vaccine: 06/25/2019 Pneumococcal vaccine: Pneumovax 10/25/2013 and Prevnar 10/20/2015 Tdap vaccine: 04/02/2011 Shingles vaccine: Please call your insurance company to determine your out of pocket expense for the Shingrix vaccine. You may receive this vaccine at your local pharmacy. Covid vaccine: Moderna dose 1 & 2  Advanced directives: Advance directives discussed with you today. Please bring a copy of your POA (Power of [place]) and/or Living Will to your next appointment.  Goals: Recommend to drink at least 6-8 8oz glasses of water per day.  Recommend to exercise for at least 150 minutes per week.  Recommend to remove any items from the home that may cause slips or trips.  Recommend to decrease portion sizes by eating 3 small healthy meals and at least 2 healthy snacks per day.  Recommend to begin DASH diet as directed below  Recommend to continue efforts to reduce smoking habits until no longer smoking. Smoking Cessation literature is attached below.  Next appointment: Please schedule your Annual Wellness Visit with your Nurse Health Advisor in one year.  Preventive Care 1 Years and Older, Female Preventive care refers to lifestyle choices and visits with your health care provider that can promote health and wellness. What does preventive care include?  A yearly physical exam. This is also called an annual well  check.  Dental exams once or twice a year.  Routine eye exams. Ask your health care provider how often you should have your eyes checked.  Personal lifestyle choices, including:  Daily care of your teeth and gums.  Regular physical activity.  Eating a healthy diet.  Avoiding tobacco and drug use.  Limiting alcohol use.  Practicing safe sex.  Taking low-dose aspirin every day if recommended by your health care provider.  Taking vitamin and mineral supplements as recommended by your health care provider. What happens during an annual well check? The services and screenings done by your health care provider during your annual well check will depend on your age, overall health, lifestyle risk factors, and family history of disease. Counseling  Your health care provider may ask you questions about your:  Alcohol use.  Tobacco use.  Drug use.  Emotional well-being.  Home and relationship well-being.  Sexual activity.  Eating habits.  History of falls.  Memory and ability to understand (cognition).  Work and work Statistician.  Reproductive health. Screening  You may have the following tests or measurements:  Height, weight, and BMI.  Blood pressure.  Lipid and cholesterol levels. These may be checked every 5 years, or more frequently if you are over 43 years old.  Skin check.  Lung cancer screening. You may have this screening every year starting at age 84 if you have a 30-pack-year history of smoking and currently smoke or have quit within the past 15 years.  Fecal occult blood test (FOBT) of the stool. You may have this test every year starting at age 11.  Flexible sigmoidoscopy or colonoscopy. You may have a sigmoidoscopy every 5 years or a colonoscopy  every 10 years starting at age 89.  Hepatitis C blood test.  Hepatitis B blood test.  Sexually transmitted disease (STD) testing.  Diabetes screening. This is done by checking your blood sugar  (glucose) after you have not eaten for a while (fasting). You may have this done every 1-3 years.  Bone density scan. This is done to screen for osteoporosis. You may have this done starting at age 45.  Mammogram. This may be done every 1-2 years. Talk to your health care provider about how often you should have regular mammograms. Talk with your health care provider about your test results, treatment options, and if necessary, the need for more tests. Vaccines  Your health care provider may recommend certain vaccines, such as:  Influenza vaccine. This is recommended every year.  Tetanus, diphtheria, and acellular pertussis (Tdap, Td) vaccine. You may need a Td booster every 10 years.  Zoster vaccine. You may need this after age 40.  Pneumococcal 13-valent conjugate (PCV13) vaccine. One dose is recommended after age 66.  Pneumococcal polysaccharide (PPSV23) vaccine. One dose is recommended after age 18. Talk to your health care provider about which screenings and vaccines you need and how often you need them. This information is not intended to replace advice given to you by your health care provider. Make sure you discuss any questions you have with your health care provider. Document Released: 10/27/2015 Document Revised: 06/19/2016 Document Reviewed: 08/01/2015 Elsevier Interactive Patient Education  2017 [employer] Prevention in the Home Falls can cause injuries. They can happen to people of all ages. There are many things you can do to make your home safe and to help prevent falls. What can I do on the outside of my home?  Regularly fix the edges of walkways and driveways and fix any cracks.  Remove anything that might make you trip as you walk through a door, such as a raised step or threshold.  Trim any bushes or trees on the path to your home.  Use bright outdoor lighting.  Clear any walking paths of anything that might make someone trip, such as rocks or  tools.  Regularly check to see if handrails are loose or broken. Make sure that both sides of any steps have handrails.  Any raised decks and porches should have guardrails on the edges.  Have any leaves, snow, or ice cleared regularly.  Use sand or salt on walking paths during winter.  Clean up any spills in your garage right away. This includes oil or grease spills. What can I do in the bathroom?  Use night lights.  Install grab bars by the toilet and in the tub and shower. Do not use towel bars as grab bars.  Use non-skid mats or decals in the tub or shower.  If you need to sit down in the shower, use a plastic, non-slip stool.  Keep the floor dry. Clean up any water that spills on the floor as soon as it happens.  Remove soap buildup in the tub or shower regularly.  Attach bath mats securely with double-sided non-slip rug tape.  Do not have throw rugs and other things on the floor that can make you trip. What can I do in the bedroom?  Use night lights.  Make sure that you have a light by your bed that is easy to reach.  Do not use any sheets or blankets that are too big for your bed. They should not hang down onto the floor.  Have a firm chair that has side arms. You can use this for support while you get dressed.  Do not have throw rugs and other things on the floor that can make you trip. What can I do in the kitchen?  Clean up any spills right away.  Avoid walking on wet floors.  Keep items that you use a lot in easy-to-reach places.  If you need to reach something above you, use a strong step stool that has a grab bar.  Keep electrical cords out of the way.  Do not use floor polish or wax that makes floors slippery. If you must use wax, use non-skid floor wax.  Do not have throw rugs and other things on the floor that can make you trip. What can I do with my stairs?  Do not leave any items on the stairs.  Make sure that there are handrails on both  sides of the stairs and use them. Fix handrails that are broken or loose. Make sure that handrails are as long as the stairways.  Check any carpeting to make sure that it is firmly attached to the stairs. Fix any carpet that is loose or worn.  Avoid having throw rugs at the top or bottom of the stairs. If you do have throw rugs, attach them to the floor with carpet tape.  Make sure that you have a light switch at the top of the stairs and the bottom of the stairs. If you do not have them, ask someone to add them for you. What else can I do to help prevent falls?  Wear shoes that:  Do not have high heels.  Have rubber bottoms.  Are comfortable and fit you well.  Are closed at the toe. Do not wear sandals.  If you use a stepladder:  Make sure that it is fully opened. Do not climb a closed stepladder.  Make sure that both sides of the stepladder are locked into place.  Ask someone to hold it for you, if possible.  Clearly mark and make sure that you can see:  Any grab bars or handrails.  First and last steps.  Where the edge of each step is.  Use tools that help you move around (mobility aids) if they are needed. These include:  Canes.  Walkers.  Scooters.  Crutches.  Turn on the lights when you go into a dark area. Replace any light bulbs as soon as they burn out.  Set up your furniture so you have a clear path. Avoid moving your furniture around.  If any of your floors are uneven, fix them.  If there are any pets around you, be aware of where they are.  Review your medicines with your doctor. Some medicines can make you feel dizzy. This can increase your chance of falling. Ask your doctor what other things that you can do to help prevent falls. This information is not intended to replace advice given to you by your health care provider. Make sure you discuss any questions you have with your health care provider. Document Released: 07/27/2009 Document Revised:  03/07/2016 Document Reviewed: 11/04/2014 Elsevier Interactive Patient Education  2017 [employer].

## 2020-03-22 ENCOUNTER — Telehealth: Payer: Self-pay | Admitting: Interventional Cardiology

## 2020-03-22 MED ORDER — LOSARTAN POTASSIUM 100 MG PO TABS: 100.0000 mg | ORAL_TABLET | Freq: Every day | ORAL | 0 refills | Status: DC

## 2020-03-22 MED ORDER — HYDROCHLOROTHIAZIDE 25 MG PO TABS: 25.0000 mg | ORAL_TABLET | Freq: Every day | ORAL | 0 refills | Status: DC

## 2020-03-22 MED ORDER — BYSTOLIC 20 MG PO TABS: 1.0000 | ORAL_TABLET | Freq: Every day | ORAL | 0 refills | Status: DC

## 2020-03-22 NOTE — Telephone Encounter (Signed)
Pt's medications were sent to requested pharmacy for a 10 day supply until pt comes back from out of town. Confirmation received.

## 2020-03-22 NOTE — Telephone Encounter (Signed)
*  STAT* If patient is at the pharmacy, call can be transferred to refill team.   1. Which medications need to be refilled? (please list name of each medication and dose if known)  hydrochlorothiazide (HYDRODIURIL) 25 MG tablet BYSTOLIC 20 MG TABS losartan (COZAAR) 100 MG tablet   2. Which pharmacy/location (including street and city if local pharmacy) is medication to be sent to?  [id number] - [address], [address]  3. Do they need a 30 day or 90 day supply? 5 days  Pt is in [place] and forgot her medications at home. The patient needs enough to last her the duration of the trip

## 2020-04-18 ENCOUNTER — Other Ambulatory Visit: Payer: Self-pay | Admitting: Obstetrics and Gynecology

## 2020-04-18 DIAGNOSIS — Z1231 Encounter for screening mammogram for malignant neoplasm of breast: Secondary | ICD-10-CM

## 2020-05-09 ENCOUNTER — Telehealth (INDEPENDENT_AMBULATORY_CARE_PROVIDER_SITE_OTHER): Payer: Medicare Other | Admitting: Family

## 2020-05-09 ENCOUNTER — Other Ambulatory Visit: Payer: Self-pay

## 2020-05-09 DIAGNOSIS — H6690 Otitis media, unspecified, unspecified ear: Secondary | ICD-10-CM

## 2020-05-09 MED ORDER — AMOXICILLIN-POT CLAVULANATE 875-125 MG PO TABS: 1.0000 | ORAL_TABLET | Freq: Two times a day (BID) | ORAL | 0 refills | Status: AC

## 2020-05-09 NOTE — Progress Notes (Signed)
Deborah [name] is a 74 y.o. female with the following history as recorded in Epic[employer]:  Patient Active Problem List   Diagnosis Date Noted  . Perioral dermatitis 06/26/2017  . Hyperglycemia 06/26/2017  . Facial rash 05/17/2017  . Osteopenia 12/15/2016  . Moderate single current episode of major depressive disorder ([place]) 09/10/2016  . Cough 09/10/2016  . SVT (supraventricular tachycardia) ([place]) 03/17/2014  . Asthma 07/29/2012  . Hyperlipidemia 07/19/2008  . Essential hypertension 07/19/2008  . DVT, HX OF 07/19/2008    Current Outpatient Medications  Medication Sig Dispense Refill  . albuterol (PROVENTIL HFA;VENTOLIN HFA) 108 (90 Base) MCG/ACT inhaler Inhale 2 puffs into the lungs every 4 (four) hours as needed for wheezing. 1 Inhaler 2  . amoxicillin-clavulanate (AUGMENTIN) 875-125 MG tablet Take 1 tablet by mouth 2 (two) times daily for 10 days. 20 tablet 0  . Cholecalciferol (VITAMIN D3) 1000 units CAPS Take 2,000 Units by mouth daily.     . Cranberry-Vitamin C 15000-100 MG CAPS Take by mouth.    . hydrochlorothiazide (HYDRODIURIL) 25 MG tablet Take 1 tablet (25 mg total) by mouth at bedtime. 10 tablet 0  . losartan (COZAAR) 100 MG tablet Take 1 tablet (100 mg total) by mouth daily. 10 tablet 0  . Multiple Vitamins-Minerals (CENTRUM SILVER ULTRA WOMENS PO) Take 1 tablet by mouth.    . Nebivolol HCl (BYSTOLIC) 20 MG TABS Take 1 tablet (20 mg total) by mouth daily. 10 tablet 0  . Zinc 50 MG TABS Take by mouth.     No current facility-administered medications for this visit.    Allergies: Statins and Propoxyphene n-acetaminophen  Past Medical History:  Diagnosis Date  . Clotting disorder (HCC)    DVT  . 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 GERD (gastroesophageal reflux disease)   . Hyperlipidemia   . Hypertension   . Ocular migraine   . Pneumonia   . UTI (urinary tract infection) 10/10/13   E coli 70,000 colonies    Past Surgical History:  Procedure  Laterality Date  . DVT     2-3 X ; on BCP with first  . ENDOVENOUS ABLATION SAPHENOUS VEIN W/ LASER  9381   complicated by DVT   . ENDOVENOUS ABLATION SAPHENOUS VEIN W/ LASER  2013   Dr Renaldo Reel  . no colonoscopy     SOC reviewed  . TONSILLECTOMY    . TUBAL LIGATION     G3 P3  . WISDOM TOOTH EXTRACTION      Family History  Problem Relation Age of Onset  . Arthritis Mother        OA  . Melanoma Mother 64       in situ  . Transient ischemic attack Mother        late 83s  . COPD Mother   . Cancer Father        renal cancer  . Diabetes Father   . Heart attack Father        in 21s  . Hypertension Father   . Asthma Father   . Hypertension Sister   . Asthma Sister   . Kidney disease Sister        ? from Celebrex  . Kidney failure Sister   . Breast cancer Sister   . Diabetes Brother   . Emphysema Paternal Grandfather   . Stroke Paternal Grandfather        in 14s  . Stroke Paternal Grandmother  in 56s  . Diabetes Paternal Grandmother   . Hypertension Paternal Grandmother   . Other Sister        Chron's or UC  . Breast cancer Cousin   . Colon polyps Neg Hx   . Esophageal cancer Neg Hx   . Pancreatic cancer Neg Hx   . Rectal cancer Neg Hx   . Stomach cancer Neg Hx     Social History   Tobacco Use  . Smoking status: Former Smoker    Quit date: 10/14/1969    Years since quitting: 50.6  . Smokeless tobacco: Never Used  . Tobacco comment: Quit at age 58 (smoked x 2 years, up to 1 ppd)   Substance Use Topics  . Alcohol use: Yes    Alcohol/week: 2.0 standard drinks    Types: 2 Glasses of wine per week    Comment: Red wine (2 glasses weekly or less)     Subjective:   I connected with Deborah [name] on 05/09/20 at 10:00 AM EDT by a video enabled telemedicine application and verified that I am speaking with the correct person using two identifiers.   I discussed the limitations of evaluation and management by telemedicine and the availability of in  person appointments. The patient expressed understanding and agreed to proceed. Provider in office/ patient is at home; provider and patient are only 2 people on video call.   Complaining of ear infection; notes she has been feeling dizzy and she is always dizzy when she has any type of infection.  Patient is extremely upset and frustrated because her appointment had to be converted to a virtual visit this morning when she answered yes to our COVID screening questions.      Objective:  There were no vitals filed for this visit.  General: Well developed, well nourished, in no acute distress  Lungs: Respirations unlabored; clear to auscultation bilaterally without wheeze, rales, rhonchi  Neurologic: Alert and oriented; speech intact; face symmetrical; moves all extremities well; CNII-XII intact without focal deficit   Assessment:  1. Acute otitis media, unspecified otitis media type     Plan:  Rx for Augmentin 875 mg bid x 10 days;    No follow-ups on file.  No orders of the defined types were placed in this encounter.   Requested Prescriptions   Signed Prescriptions Disp Refills  . amoxicillin-clavulanate (AUGMENTIN) 875-125 MG tablet 20 tablet 0    Sig: Take 1 tablet by mouth 2 (two) times daily for 10 days.

## 2020-05-11 DIAGNOSIS — Z20822 Contact with and (suspected) exposure to covid-19: Secondary | ICD-10-CM | POA: Diagnosis not present

## 2020-05-16 ENCOUNTER — Ambulatory Visit: Payer: Medicare Other

## 2020-05-20 ENCOUNTER — Encounter: Payer: Self-pay | Admitting: Family

## 2020-05-22 ENCOUNTER — Other Ambulatory Visit: Payer: Self-pay | Admitting: Family

## 2020-05-22 MED ORDER — FLUCONAZOLE 150 MG PO TABS: ORAL_TABLET | ORAL | 0 refills | Status: DC

## 2020-05-23 ENCOUNTER — Other Ambulatory Visit: Payer: Self-pay | Admitting: Interventional Cardiology

## 2020-05-25 ENCOUNTER — Other Ambulatory Visit: Payer: Self-pay | Admitting: Interventional Cardiology

## 2020-05-31 ENCOUNTER — Encounter: Payer: Self-pay | Admitting: Interventional Cardiology

## 2020-05-31 ENCOUNTER — Ambulatory Visit: Payer: Medicare Other | Admitting: Interventional Cardiology

## 2020-05-31 ENCOUNTER — Other Ambulatory Visit: Payer: Self-pay

## 2020-05-31 VITALS — BP 154/92 | HR 62 | Ht 63.0 in | Wt 192.4 lb

## 2020-05-31 DIAGNOSIS — E7849 Other hyperlipidemia: Secondary | ICD-10-CM | POA: Diagnosis not present

## 2020-05-31 DIAGNOSIS — I471 Supraventricular tachycardia: Secondary | ICD-10-CM

## 2020-05-31 DIAGNOSIS — I10 Essential (primary) hypertension: Secondary | ICD-10-CM | POA: Diagnosis not present

## 2020-05-31 DIAGNOSIS — Z7189 Other specified counseling: Secondary | ICD-10-CM | POA: Diagnosis not present

## 2020-05-31 NOTE — Progress Notes (Signed)
Cardiology Office Note:    Date:  05/31/2020   ID:  Deborah [name], DOB [birth year], MRN [id number]  PCP:  Binnie Rail, MD  Cardiologist:  Sinclair Grooms, MD   Referring MD: Binnie Rail, MD   Chief Complaint  Patient presents with  . Atrial Fibrillation  . Congestive Heart Failure    History of Present Illness:    Deborah [name] is a 74 y.o. female with a hx of hypertension, palpitations, history of DVT, hyperlipidemia, and family history ofvascular disease.  She is doing well.  No cardiac complaints other than palpitations predominantly when she is at rest at night prior to going to sleep.  No prolonged tachycardia or palpitations.  Denies lower extremity swelling.  Denies orthopnea and PND.  Past Medical History:  Diagnosis Date  . Clotting disorder (HCC)    DVT  . 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 GERD (gastroesophageal reflux disease)   . Hyperlipidemia   . Hypertension   . Ocular migraine   . Pneumonia   . UTI (urinary tract infection) 10/10/13   E coli 70,000 colonies    Past Surgical History:  Procedure Laterality Date  . DVT     2-3 X ; on BCP with first  . ENDOVENOUS ABLATION SAPHENOUS VEIN W/ LASER  6063   complicated by DVT   . ENDOVENOUS ABLATION SAPHENOUS VEIN W/ LASER  2013   Dr Renaldo Reel  . no colonoscopy     SOC reviewed  . TONSILLECTOMY    . TUBAL LIGATION     G3 P3  . WISDOM TOOTH EXTRACTION      Current Medications: Current Meds  Medication Sig  . albuterol (PROVENTIL HFA;VENTOLIN HFA) 108 (90 Base) MCG/ACT inhaler Inhale 2 puffs into the lungs every 4 (four) hours as needed for wheezing.  Marland Kitchen aspirin EC 81 MG tablet Take 81 mg by mouth daily. Swallow whole.  . Cholecalciferol (VITAMIN D3) 1000 units CAPS Take 2,000 Units by mouth daily.   . Cranberry-Vitamin C 15000-100 MG CAPS Take by mouth.  . hydrochlorothiazide (HYDRODIURIL) 25 MG tablet TAKE 1 TABLET BY MOUTH AT BEDTIME  . losartan (COZAAR) 100  MG tablet Take 1 tablet (100 mg total) by mouth daily. Please keep upcoming appt in August with Dr. Tamala Julian before anymore refills. Thank you  . Multiple Vitamins-Minerals (CENTRUM SILVER ULTRA WOMENS PO) Take 1 tablet by mouth.  . Nebivolol HCl (BYSTOLIC) 20 MG TABS Take 1 tablet (20 mg total) by mouth daily.  . Zinc 50 MG TABS Take by mouth.     Allergies:   Statins and Propoxyphene n-acetaminophen   Social History   Socioeconomic History  . Marital status: Married    Spouse name: Not on file  . Number of children: 3  . Years of education: Not on file  . Highest education level: Not on file  Occupational History  . Occupation: retired Therapist, sports  Tobacco Use  . Smoking status: Former Smoker    Quit date: 10/14/1969    Years since quitting: 50.6  . Smokeless tobacco: Never Used  . Tobacco comment: Quit at age 71 (smoked x 2 years, up to 1 ppd)   Substance and Sexual Activity  . Alcohol use: Yes    Alcohol/week: 2.0 standard drinks    Types: 2 Glasses of wine per week    Comment: Red wine (2 glasses weekly or less)   . Drug use: No  . Sexual activity: Not  on file  Other Topics Concern  . Not on file  Social History Narrative  . Not on file   Social Determinants of Health   Financial Resource Strain:   . Difficulty of Paying Living Expenses:   Food Insecurity:   . Worried About Charity fundraiser in the Last Year:   . Arboriculturist in the Last Year:   Transportation Needs:   . Film/video editor (Medical):   Marland Kitchen Lack of Transportation (Non-Medical):   Physical Activity:   . Days of Exercise per Week:   . Minutes of Exercise per Session:   Stress:   . Feeling of Stress :   Social Connections:   . Frequency of Communication with Friends and Family:   . Frequency of Social Gatherings with Friends and Family:   . Attends Religious Services:   . Active Member of Clubs or Organizations:   . Attends Archivist Meetings:   Marland Kitchen Marital Status:      Family  History: The patient's family history includes Arthritis in her mother; Asthma in her father and sister; Breast cancer in her cousin and sister; COPD in her mother; Cancer in her father; Diabetes in her brother, father, and paternal grandmother; Emphysema in her paternal grandfather; Heart attack in her father; Hypertension in her father, paternal grandmother, and sister; Kidney disease in her sister; Kidney failure in her sister; Melanoma (age of onset: 62) in her mother; Other in her sister; Stroke in her paternal grandfather and paternal grandmother; Transient ischemic attack in her mother. There is no history of Colon polyps, Esophageal cancer, Pancreatic cancer, Rectal cancer, or Stomach cancer.  ROS:   Please see the history of present illness.    Overall no real complaints.  She is active.  Gets greater than 30 minutes of activity per week.  She and primary decided not to treat elevated cholesterol.  Blood pressures at home running in the 125/75 mmHg range.  All other systems reviewed and are negative.  EKGs/Labs/Other Studies Reviewed:    The following studies were reviewed today: No new data  EKG:  EKG normal sinus rhythm, poor R wave progression V1 through V4.  Leftward axis.  Recent Labs: 06/30/2019: Hemoglobin 15.3; Platelets 261.0  Recent Lipid Panel    Component Value Date/Time   CHOL 194 06/30/2019 0822   TRIG 98.0 06/30/2019 0822   HDL 61.40 06/30/2019 0822   CHOLHDL 3 06/30/2019 0822   VLDL 19.6 06/30/2019 0822   LDLCALC 113 (H) 06/30/2019 0822   LDLDIRECT 147.1 07/30/2012 0850    Physical Exam:    VS:  BP (!) 154/92   Pulse 62   Ht 5\' 3"  (1.6 m)   Wt 192 lb 6.4 oz (87.3 kg)   SpO2 96%   BMI 34.08 kg/m     Wt Readings from Last 3 Encounters:  05/31/20 192 lb 6.4 oz (87.3 kg)  05/14/19 185 lb 6.4 oz (84.1 kg)  05/05/19 181 lb (82.1 kg)     GEN: Mildly overweight. No acute distress HEENT: Normal NECK: No JVD. LYMPHATICS: No lymphadenopathy CARDIAC:  RRR  without murmur, gallop, or edema. VASCULAR:  Normal Pulses. No bruits. RESPIRATORY:  Clear to auscultation without rales, wheezing or rhonchi  ABDOMEN: Soft, non-tender, non-distended, No pulsatile mass, MUSCULOSKELETAL: No deformity  SKIN: Warm and dry NEUROLOGIC:  Alert and oriented x 3 PSYCHIATRIC:  Normal affect   ASSESSMENT:    1. Essential hypertension   2. SVT (supraventricular tachycardia) ([place])  3. Other hyperlipidemia   4. Educated about COVID-19 virus infection    PLAN:    In order of problems listed above:  1. As usual, blood pressure in the office is not well controlled.  Recordings from home on a reliable device are usually less than 135/85.  Low-salt diet, continued physical activity, weight loss, were all discussed.  Continue Bystolic 20 mg/day, hydrochlorothiazide 25 mg/day, and losartan 100 mg/day. 2. No palpitations of any degree that would suggest SVT.  Continue Bystolic. 3. She and primary care decided against management/therapy for lipids.  I encouraged today low-fat diet.  Exercise. 4. She is fully vaccinated and is interested in receiving a booster when it becomes available.    Medication Adjustments/Labs and Tests Ordered: Current medicines are reviewed at length with the patient today.  Concerns regarding medicines are outlined above.  Orders Placed This Encounter  Procedures  . EKG 12-Lead   No orders of the defined types were placed in this encounter.   Patient Instructions  Medication Instructions:  Your physician recommends that you continue on your current medications as directed. Please refer to the Current Medication list given to you today.  *If you need a refill on your cardiac medications before your next appointment, please call your pharmacy*   Lab Work: None If you have labs (blood work) drawn today and your tests are completely normal, you will receive your results only by: Marland Kitchen MyChart Message (if you have MyChart) OR . A paper  copy in the mail If you have any lab test that is abnormal or we need to change your treatment, we will call you to review the results.   Testing/Procedures: None   Follow-Up: At [facility], you and your health needs are our priority.  As part of our continuing mission to provide you with exceptional heart care, we have created designated Provider Care Teams.  These Care Teams include your primary Cardiologist (physician) and Advanced Practice Providers (APPs -  Physician Assistants and Nurse Practitioners) who all work together to provide you with the care you need, when you need it.  We recommend signing up for the patient portal called "MyChart".  Sign up information is provided on this After Visit Summary.  MyChart is used to connect with patients for Virtual Visits (Telemedicine).  Patients are able to view lab/test results, encounter notes, upcoming appointments, etc.  Non-urgent messages can be sent to your provider as well.   To learn more about what you can do with MyChart, go to [email or web address].    Your next appointment:   12 month(s)  The format for your next appointment:   In Person  Provider:   You may see Sinclair Grooms, MD or one of the following Advanced Practice Providers on your designated Care Team:    Truitt Merle, NP  Cecilie Kicks, NP  Kathyrn Drown, NP    Other Instructions      Signed, Sinclair Grooms, MD  05/31/2020 4:55 PM    [place]

## 2020-05-31 NOTE — Patient Instructions (Signed)

## 2020-06-03 ENCOUNTER — Other Ambulatory Visit: Payer: Self-pay | Admitting: Interventional Cardiology

## 2020-06-05 ENCOUNTER — Ambulatory Visit
Admission: RE | Admit: 2020-06-05 | Discharge: 2020-06-05 | Disposition: A | Discharge status: Home / Self Care | Payer: Medicare Other | Source: Ambulatory Visit | Attending: Obstetrics and Gynecology | Admitting: Obstetrics and Gynecology

## 2020-06-05 ENCOUNTER — Other Ambulatory Visit: Payer: Self-pay

## 2020-06-05 DIAGNOSIS — Z1231 Encounter for screening mammogram for malignant neoplasm of breast: Secondary | ICD-10-CM

## 2020-06-09 ENCOUNTER — Encounter: Payer: Self-pay | Admitting: Internal Medicine

## 2020-06-14 DIAGNOSIS — I8312 Varicose veins of left lower extremity with inflammation: Secondary | ICD-10-CM | POA: Diagnosis not present

## 2020-06-14 DIAGNOSIS — I83893 Varicose veins of bilateral lower extremities with other complications: Secondary | ICD-10-CM | POA: Diagnosis not present

## 2020-06-14 DIAGNOSIS — I8311 Varicose veins of right lower extremity with inflammation: Secondary | ICD-10-CM | POA: Diagnosis not present

## 2020-08-19 ENCOUNTER — Other Ambulatory Visit: Payer: Self-pay | Admitting: Interventional Cardiology

## 2020-08-24 DIAGNOSIS — R600 Localized edema: Secondary | ICD-10-CM | POA: Diagnosis not present

## 2020-08-24 DIAGNOSIS — I83891 Varicose veins of right lower extremities with other complications: Secondary | ICD-10-CM | POA: Diagnosis not present

## 2020-08-24 DIAGNOSIS — S8011XA Contusion of right lower leg, initial encounter: Secondary | ICD-10-CM | POA: Diagnosis not present

## 2020-08-25 DIAGNOSIS — I83891 Varicose veins of right lower extremities with other complications: Secondary | ICD-10-CM | POA: Diagnosis not present

## 2020-08-25 DIAGNOSIS — R600 Localized edema: Secondary | ICD-10-CM | POA: Diagnosis not present

## 2020-09-14 ENCOUNTER — Encounter: Payer: Self-pay | Admitting: Family Medicine

## 2020-09-14 ENCOUNTER — Ambulatory Visit: Payer: Medicare Other | Admitting: Family Medicine

## 2020-09-14 ENCOUNTER — Ambulatory Visit: Payer: Self-pay

## 2020-09-14 ENCOUNTER — Other Ambulatory Visit: Payer: Self-pay

## 2020-09-14 VITALS — BP 156/82 | HR 59 | Ht 63.0 in | Wt 194.0 lb

## 2020-09-14 DIAGNOSIS — M79661 Pain in right lower leg: Secondary | ICD-10-CM

## 2020-09-14 DIAGNOSIS — S8990XA Unspecified injury of unspecified lower leg, initial encounter: Secondary | ICD-10-CM | POA: Diagnosis not present

## 2020-09-14 NOTE — Patient Instructions (Addendum)
Heel lift in the shoe Pennsaid 2x a day Arnica lotion 2x daily Continue compression with activity See me again in 3-4 weeks
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
## 2020-09-15 ENCOUNTER — Encounter: Payer: Self-pay | Admitting: Family Medicine

## 2020-09-15 DIAGNOSIS — S8990XA Unspecified injury of unspecified lower leg, initial encounter: Secondary | ICD-10-CM | POA: Insufficient documentation

## 2020-09-15 NOTE — Assessment & Plan Note (Signed)
Patient does have an injury to the right calf.  Strain versus questionable grade 1-2 tear.  No true hematoma appreciated though today.  Patient has slowly been improving over the last day since she has had the injury.  Patient was given a heel lift of 2 inches and this did make improvement.  We discussed over-the-counter topicals and given a trial of topical anti-inflammatory.  Discussed continuing the compression and icing.  Mild exercises for range of motion but will avoid strength at the moment.  We discussed with patient that with a history of DVT we should consider the possibility of a D-dimer.  Patient did have a mechanism of injury ago and patient is adamant that she talk to her vascular surgeon.  Patient understands the signs and symptoms that would be consistent with a DVT and will seek medical attention immediately.  Patient otherwise will follow up with me again in 3 to 4 weeks

## 2020-09-19 IMAGING — MG DIGITAL SCREENING BILATERAL MAMMOGRAM WITH TOMO AND CAD
8 series · 8 of 24 positions shown · non-contrast
Comparison: Previous exam(s).

CLINICAL DATA: Screening.

EXAM:
DIGITAL SCREENING BILATERAL MAMMOGRAM WITH TOMO AND CAD

[R CC synth-2D]
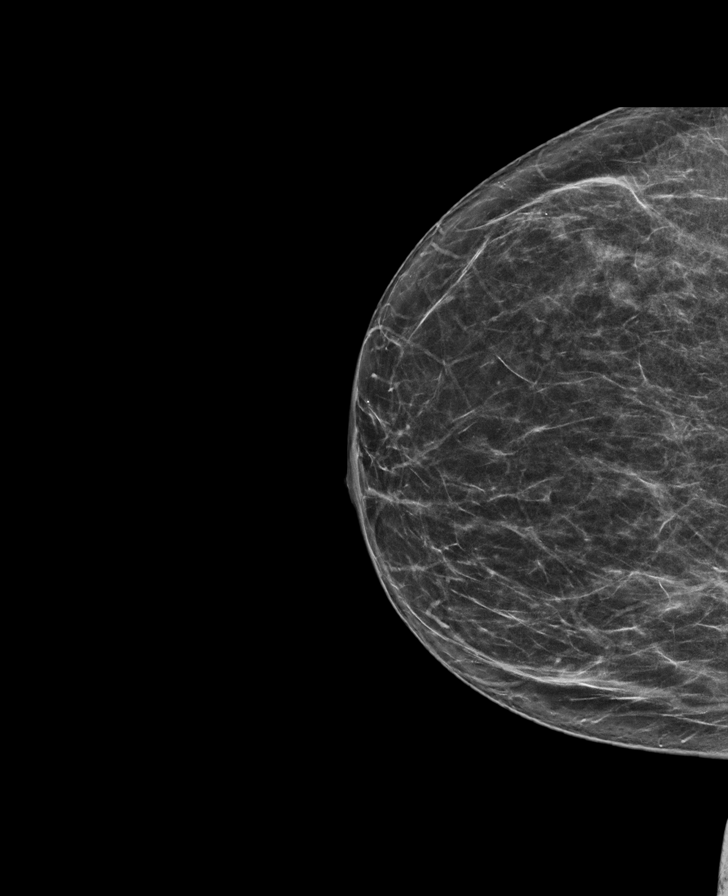

[L CC synth-2D]
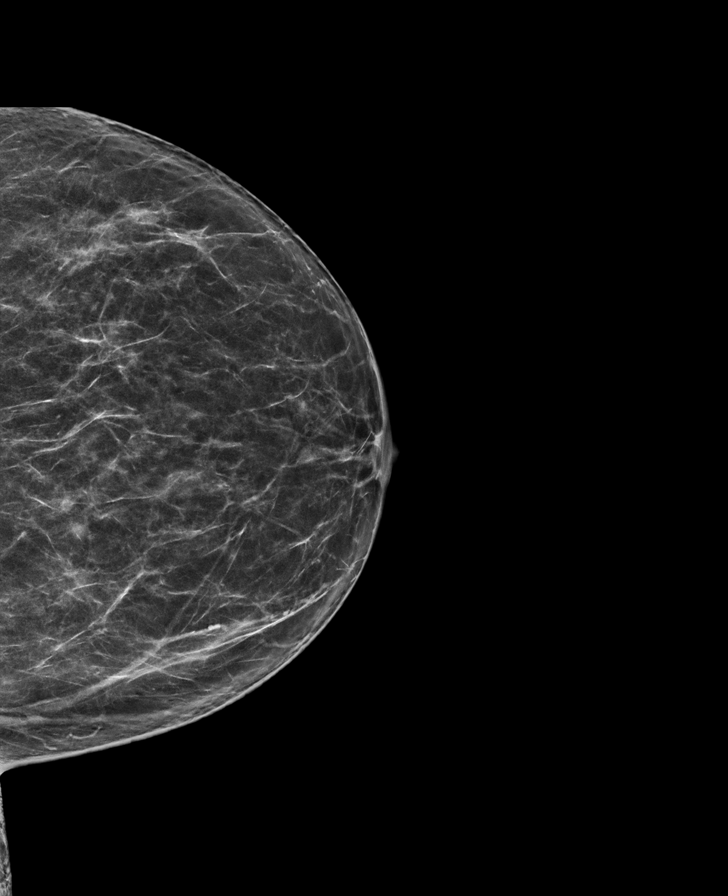

[R MLO synth-2D]
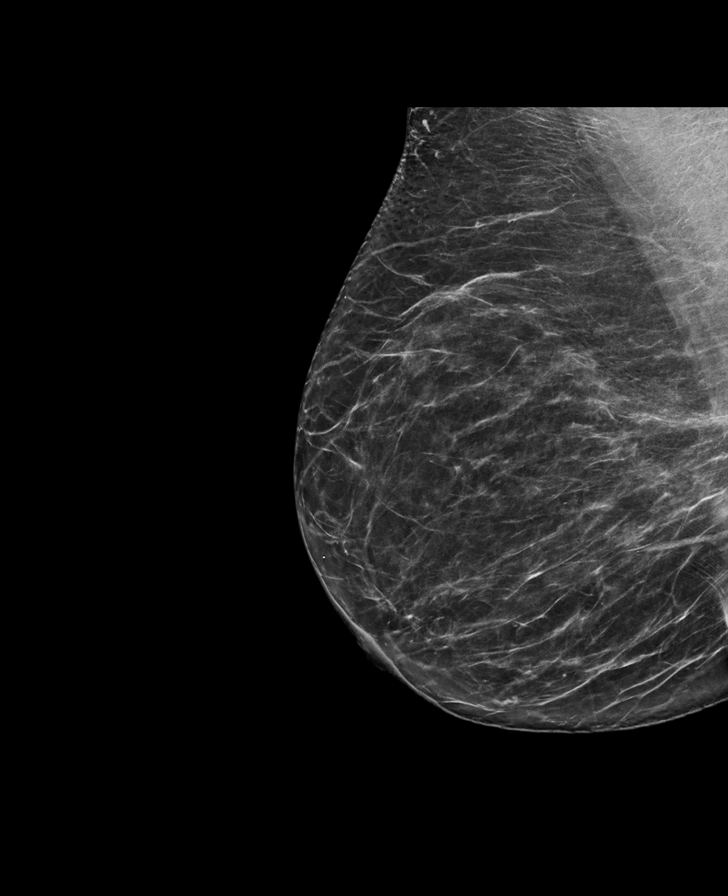

[L MLO synth-2D]
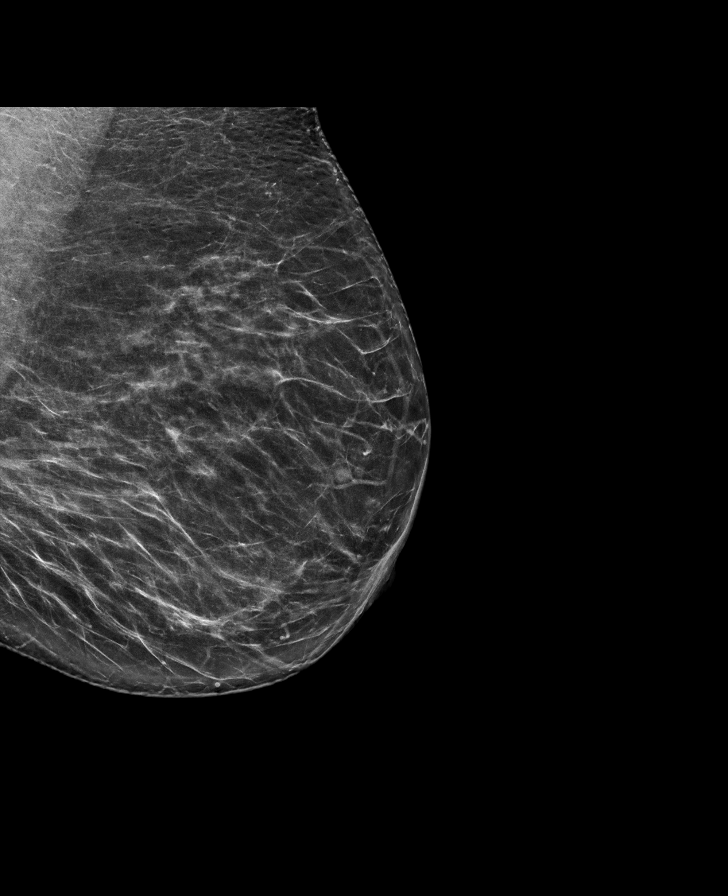

[R CC tomo · tomo slice 35/68.0]
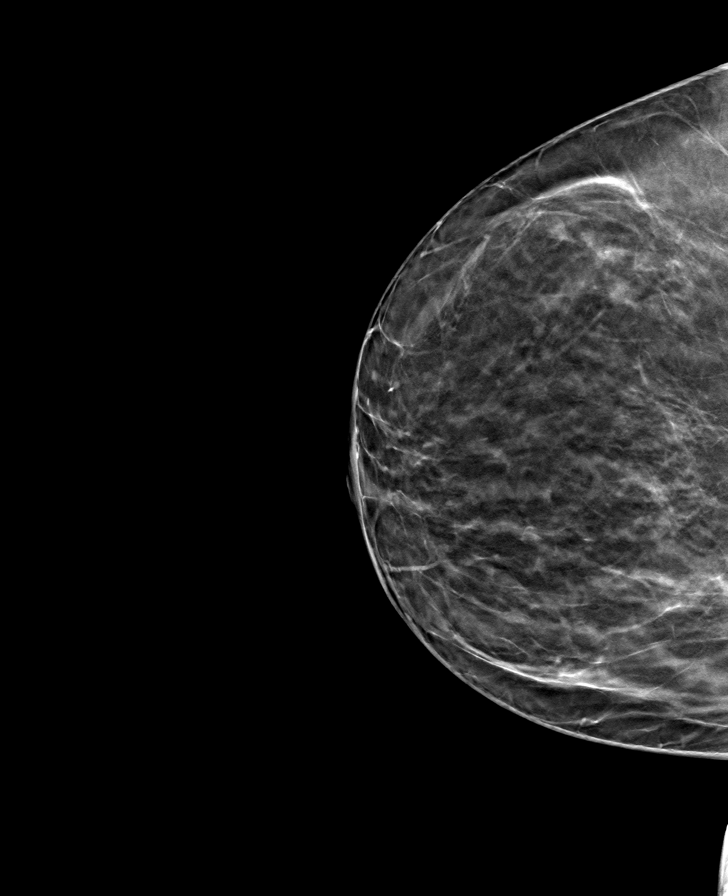

[L MLO tomo · tomo slice 39/76.0]
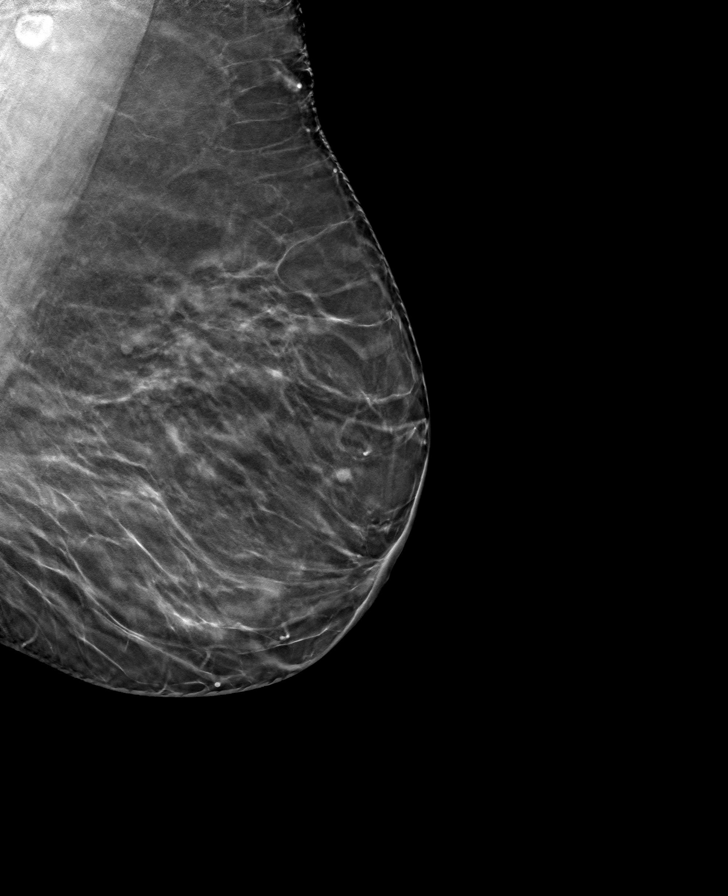

[R MLO tomo · tomo slice 40/79.0]
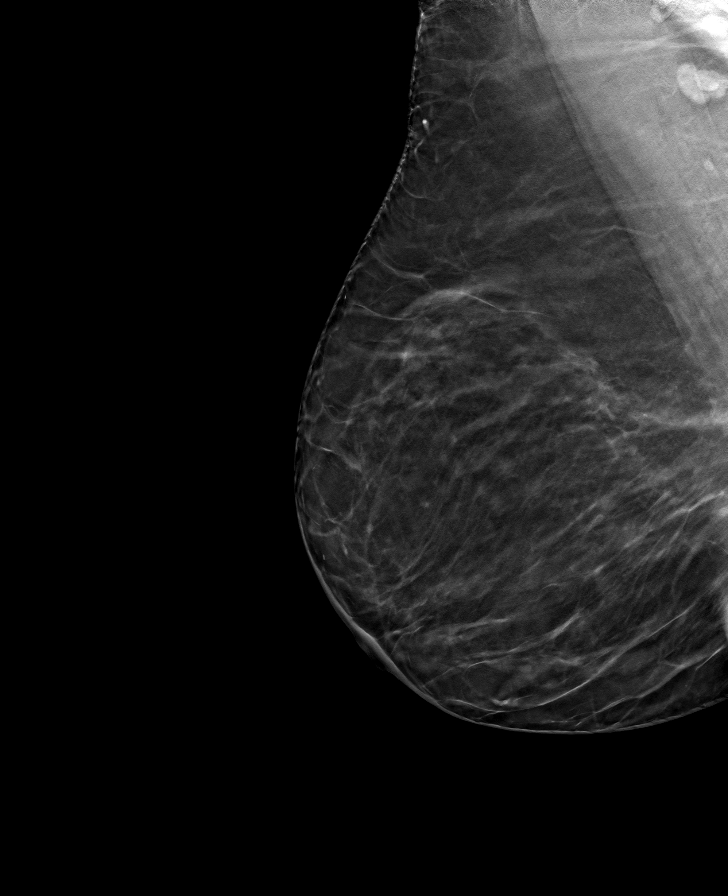

[L CC tomo · tomo slice 35/68.0]
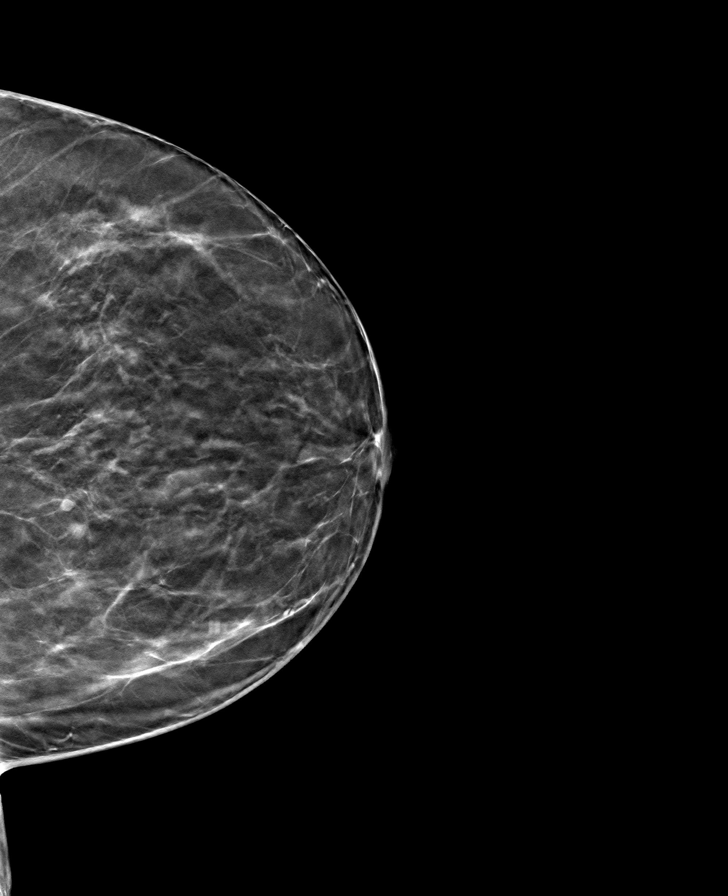

[8 of 24 positions shown; findings below may reference images not displayed]

ACR Breast Density Category b: There are scattered areas of
fibroglandular density.
FINDINGS: There are no findings suspicious for malignancy. Images were
processed with CAD.
IMPRESSION: No mammographic evidence of malignancy. A result letter of this
screening mammogram will be mailed directly to the patient.

RECOMMENDATION:
Screening mammogram in one year. (Code:[id number])

BI-RADS CATEGORY  1: Negative.

## 2020-10-11 ENCOUNTER — Encounter: Payer: Self-pay | Admitting: Family Medicine

## 2020-10-11 ENCOUNTER — Ambulatory Visit: Payer: Medicare Other | Admitting: Family Medicine

## 2020-10-11 ENCOUNTER — Other Ambulatory Visit: Payer: Self-pay

## 2020-10-11 DIAGNOSIS — S8990XA Unspecified injury of unspecified lower leg, initial encounter: Secondary | ICD-10-CM

## 2020-10-11 NOTE — Assessment & Plan Note (Signed)
Patient has no pain at all at this time.  Patient is doing very well with conservative therapy.  Patient can follow-up as needed

## 2020-10-11 NOTE — Progress Notes (Signed)
[place] [address] [place] Phone: [phone] Subjective:   I Deborah [name] am serving as a Education administrator for Dr. Hulan Saas.  This visit occurred during the SARS-CoV-2 public health emergency.  Safety protocols were in place, including screening questions prior to the visit, additional usage of staff PPE, and extensive cleaning of exam room while observing appropriate contact time as indicated for disinfecting solutions.   I'm seeing this patient by the request  of:  Binnie Rail, MD  CC: Calf pain follow-up  [id number]   09/14/2020 Patient does have an injury to the right calf.  Strain versus questionable grade 1-2 tear.  No true hematoma appreciated though today.  Patient has slowly been improving over the last day since she has had the injury.  Patient was given a heel lift of 2 inches and this did make improvement.  We discussed over-the-counter topicals and given a trial of topical anti-inflammatory.  Discussed continuing the compression and icing.  Mild exercises for range of motion but will avoid strength at the moment.  We discussed with patient that with a history of DVT we should consider the possibility of a D-dimer.  Patient did have a mechanism of injury ago and patient is adamant that she talk to her vascular surgeon.  Patient understands the signs and symptoms that would be consistent with a DVT and will seek medical attention immediately.  Patient otherwise will follow up with me again in 3 to 4 weeks  Update 10/11/2020 Deborah [name] is a 74 y.o. female coming in with complaint of right calf pain. States she is doing great and has started back walking with no pain.  Patient states that just this week starting to feel better again.  Patient has had no trouble with any other daily activities.  Was able to stand and cook for Christmas with no problem.  Patient was walking 15,000 steps a day.      Past Medical  History:  Diagnosis Date  . Clotting disorder (HCC)    DVT  . 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 GERD (gastroesophageal reflux disease)   . Hyperlipidemia   . Hypertension   . Ocular migraine   . Pneumonia   . UTI (urinary tract infection) 10/10/13   E coli 70,000 colonies   Past Surgical History:  Procedure Laterality Date  . DVT     2-3 X ; on BCP with first  . ENDOVENOUS ABLATION SAPHENOUS VEIN W/ LASER  123456   complicated by DVT   . ENDOVENOUS ABLATION SAPHENOUS VEIN W/ LASER  2013   Dr Renaldo Reel  . no colonoscopy     SOC reviewed  . TONSILLECTOMY    . TUBAL LIGATION     G3 P3  . WISDOM TOOTH EXTRACTION     Social History   Socioeconomic History  . Marital status: Married    Spouse name: Not on file  . Number of children: 3  . Years of education: Not on file  . Highest education level: Not on file  Occupational History  . Occupation: retired Therapist, sports  Tobacco Use  . Smoking status: Former Smoker    Quit date: 10/14/1969    Years since quitting: 51.0  . Smokeless tobacco: Never Used  . Tobacco comment: Quit at age 70 (smoked x 2 years, up to 1 ppd)   Substance and Sexual Activity  . Alcohol use: Yes    Alcohol/week: 2.0  standard drinks    Types: 2 Glasses of wine per week    Comment: Red wine (2 glasses weekly or less)   . Drug use: No  . Sexual activity: Not on file  Other Topics Concern  . Not on file  Social History Narrative  . Not on file   Social Determinants of Health   Financial Resource Strain: Not on file  Food Insecurity: Not on file  Transportation Needs: Not on file  Physical Activity: Not on file  Stress: Not on file  Social Connections: Not on file   Allergies  Allergen Reactions  . Statins     Muscle aches with some, elevated lft with crestor  . Propoxyphene N-Acetaminophen     REACTION: GI Upset   Family History  Problem Relation Age of Onset  . Arthritis Mother        OA  . Melanoma Mother 35       in situ  .  Transient ischemic attack Mother        late 72s  . COPD Mother   . Cancer Father        renal cancer  . Diabetes Father   . Heart attack Father        in 61s  . Hypertension Father   . Asthma Father   . Hypertension Sister   . Asthma Sister   . Kidney disease Sister        ? from Celebrex  . Kidney failure Sister   . Breast cancer Sister   . Diabetes Brother   . Emphysema Paternal Grandfather   . Stroke Paternal Grandfather        in 81s  . Stroke Paternal Grandmother        in 28s  . Diabetes Paternal Grandmother   . Hypertension Paternal Grandmother   . Other Sister        Chron's or UC  . Breast cancer Cousin   . Colon polyps Neg Hx   . Esophageal cancer Neg Hx   . Pancreatic cancer Neg Hx   . Rectal cancer Neg Hx   . Stomach cancer Neg Hx      Current Outpatient Medications (Cardiovascular):  .  BYSTOLIC 20 MG TABS, TAKE 1 TABLET BY MOUTH  DAILY .  hydrochlorothiazide (HYDRODIURIL) 25 MG tablet, TAKE 1 TABLET BY MOUTH AT BEDTIME .  losartan (COZAAR) 100 MG tablet, Take 1 tablet (100 mg total) by mouth daily.  Current Outpatient Medications (Respiratory):  .  albuterol (PROVENTIL HFA;VENTOLIN HFA) 108 (90 Base) MCG/ACT inhaler, Inhale 2 puffs into the lungs every 4 (four) hours as needed for wheezing.  Current Outpatient Medications (Analgesics):  .  aspirin EC 81 MG tablet, Take 81 mg by mouth daily. Swallow whole.   Current Outpatient Medications (Other):  Marland Kitchen  Cholecalciferol (VITAMIN D3) 1000 units CAPS, Take 2,000 Units by mouth daily.  .  Cranberry-Vitamin C 15000-100 MG CAPS, Take by mouth. .  Multiple Vitamins-Minerals (CENTRUM SILVER ULTRA WOMENS PO), Take 1 tablet by mouth. .  Zinc 50 MG TABS, Take by mouth.   Reviewed prior external information including notes and imaging from  primary care provider As well as notes that were available from care everywhere and other healthcare systems.  Past medical history, social, surgical and family history  all reviewed in electronic medical record.  No pertanent information unless stated regarding to the chief complaint.   Review of Systems:  No headache, visual changes, nausea, vomiting, diarrhea, constipation, dizziness,  abdominal pain, skin rash, fevers, chills, night sweats, weight loss, swollen lymph nodes, body aches, joint swelling, chest pain, shortness of breath, mood changes. POSITIVE muscle aches  Objective  Blood pressure (!) 150/90, pulse 74, height 5\' 3"  (1.6 m), SpO2 96 %.   General: No apparent distress alert and oriented x3 mood and affect normal, dressed appropriately.  HEENT: Pupils equal, extraocular movements intact  Respiratory: Patient's speak in full sentences and does not appear short of breath  Cardiovascular: No lower extremity edema, non tender, no erythema  Right calf is nontender.  Full strength of the lower extremity with dorsi flexion and plantar flexion.    Impression and Recommendations:     The above documentation has been reviewed and is accurate and complete Lyndal Pulley, DO

## 2020-10-19 DIAGNOSIS — L738 Other specified follicular disorders: Secondary | ICD-10-CM | POA: Diagnosis not present

## 2020-10-19 DIAGNOSIS — L570 Actinic keratosis: Secondary | ICD-10-CM | POA: Diagnosis not present

## 2020-10-19 DIAGNOSIS — L814 Other melanin hyperpigmentation: Secondary | ICD-10-CM | POA: Diagnosis not present

## 2021-03-09 ENCOUNTER — Other Ambulatory Visit: Payer: Self-pay

## 2021-03-09 ENCOUNTER — Ambulatory Visit (INDEPENDENT_AMBULATORY_CARE_PROVIDER_SITE_OTHER): Payer: Medicare Other

## 2021-03-09 VITALS — BP 140/80 | HR 53 | Temp 98.2°F | Ht 63.0 in | Wt 196.0 lb

## 2021-03-09 DIAGNOSIS — Z0000 Encounter for general adult medical examination without abnormal findings: Secondary | ICD-10-CM

## 2021-03-09 NOTE — Patient Instructions (Signed)
Deborah [name] , Thank you for taking time to come for your Medicare Wellness Visit. I appreciate your ongoing commitment to your health goals. Please review the following plan we discussed and let me know if I can assist you in the future.   Screening recommendations/referrals: Colonoscopy: 03/05/2016; due every 10 years Mammogram: 06/05/2020 Bone Density: 05/19/2019; due every 2 years Recommended yearly ophthalmology/optometry visit for glaucoma screening and checkup Recommended yearly dental visit for hygiene and checkup  Vaccinations: Influenza vaccine: 06/09/2020 Pneumococcal vaccine: 10/25/2013, 10/20/2015 Tdap vaccine: 04/02/2011; due every 10 years; can check with local pharmacy for vaccine Shingles vaccine: never done; can check with local pharmacy for vaccine   Covid-19: 11/26/2019, 12/24/2019, 08/04/2020  Advanced directives: Please bring a copy of your health care power of attorney and living will to the office at your convenience.  Conditions/risks identified: Yes; Reviewed health maintenance screenings with patient today and relevant education, vaccines, and/or referrals were provided. Please continue to do your personal lifestyle choices by: daily care of teeth and gums, regular physical activity (goal should be 5 days a week for 30 minutes), eat a healthy diet, avoid tobacco and drug use, limiting any alcohol intake, taking a low-dose aspirin (if not allergic or have been advised by your provider otherwise) and taking vitamins and minerals as recommended by your provider. Continue doing brain stimulating activities (puzzles, reading, adult coloring books, staying active) to keep memory sharp. Continue to eat heart healthy diet (full of fruits, vegetables, whole grains, lean protein, water--limit salt, fat, and sugar intake) and increase physical activity as tolerated.  Next appointment: Please schedule your next Medicare Wellness Visit with your Nurse Health Advisor in 1 year by calling  [phone].   Preventive Care 51 Years and Older, Female Preventive care refers to lifestyle choices and visits with your health care provider that can promote health and wellness. What does preventive care include?  A yearly physical exam. This is also called an annual well check.  Dental exams once or twice a year.  Routine eye exams. Ask your health care provider how often you should have your eyes checked.  Personal lifestyle choices, including:  Daily care of your teeth and gums.  Regular physical activity.  Eating a healthy diet.  Avoiding tobacco and drug use.  Limiting alcohol use.  Practicing safe sex.  Taking low-dose aspirin every day.  Taking vitamin and mineral supplements as recommended by your health care provider. What happens during an annual well check? The services and screenings done by your health care provider during your annual well check will depend on your age, overall health, lifestyle risk factors, and family history of disease. Counseling  Your health care provider may ask you questions about your:  Alcohol use.  Tobacco use.  Drug use.  Emotional well-being.  Home and relationship well-being.  Sexual activity.  Eating habits.  History of falls.  Memory and ability to understand (cognition).  Work and work Statistician.  Reproductive health. Screening  You may have the following tests or measurements:  Height, weight, and BMI.  Blood pressure.  Lipid and cholesterol levels. These may be checked every 5 years, or more frequently if you are over 82 years old.  Skin check.  Lung cancer screening. You may have this screening every year starting at age 39 if you have a 30-pack-year history of smoking and currently smoke or have quit within the past 15 years.  Fecal occult blood test (FOBT) of the stool. You may have this test every  year starting at age 34.  Flexible sigmoidoscopy or colonoscopy. You may have a  sigmoidoscopy every 5 years or a colonoscopy every 10 years starting at age 55.  Hepatitis C blood test.  Hepatitis B blood test.  Sexually transmitted disease (STD) testing.  Diabetes screening. This is done by checking your blood sugar (glucose) after you have not eaten for a while (fasting). You may have this done every 1-3 years.  Bone density scan. This is done to screen for osteoporosis. You may have this done starting at age 71.  Mammogram. This may be done every 1-2 years. Talk to your health care provider about how often you should have regular mammograms. Talk with your health care provider about your test results, treatment options, and if necessary, the need for more tests. Vaccines  Your health care provider may recommend certain vaccines, such as:  Influenza vaccine. This is recommended every year.  Tetanus, diphtheria, and acellular pertussis (Tdap, Td) vaccine. You may need a Td booster every 10 years.  Zoster vaccine. You may need this after age 30.  Pneumococcal 13-valent conjugate (PCV13) vaccine. One dose is recommended after age 15.  Pneumococcal polysaccharide (PPSV23) vaccine. One dose is recommended after age 72. Talk to your health care provider about which screenings and vaccines you need and how often you need them. This information is not intended to replace advice given to you by your health care provider. Make sure you discuss any questions you have with your health care provider. Document Released: 10/27/2015 Document Revised: 06/19/2016 Document Reviewed: 08/01/2015 Elsevier Interactive Patient Education  2017 [employer] Prevention in the Home Falls can cause injuries. They can happen to people of all ages. There are many things you can do to make your home safe and to help prevent falls. What can I do on the outside of my home?  Regularly fix the edges of walkways and driveways and fix any cracks.  Remove anything that might make you  trip as you walk through a door, such as a raised step or threshold.  Trim any bushes or trees on the path to your home.  Use bright outdoor lighting.  Clear any walking paths of anything that might make someone trip, such as rocks or tools.  Regularly check to see if handrails are loose or broken. Make sure that both sides of any steps have handrails.  Any raised decks and porches should have guardrails on the edges.  Have any leaves, snow, or ice cleared regularly.  Use sand or salt on walking paths during winter.  Clean up any spills in your garage right away. This includes oil or grease spills. What can I do in the bathroom?  Use night lights.  Install grab bars by the toilet and in the tub and shower. Do not use towel bars as grab bars.  Use non-skid mats or decals in the tub or shower.  If you need to sit down in the shower, use a plastic, non-slip stool.  Keep the floor dry. Clean up any water that spills on the floor as soon as it happens.  Remove soap buildup in the tub or shower regularly.  Attach bath mats securely with double-sided non-slip rug tape.  Do not have throw rugs and other things on the floor that can make you trip. What can I do in the bedroom?  Use night lights.  Make sure that you have a light by your bed that is easy to reach.  Do  not use any sheets or blankets that are too big for your bed. They should not hang down onto the floor.  Have a firm chair that has side arms. You can use this for support while you get dressed.  Do not have throw rugs and other things on the floor that can make you trip. What can I do in the kitchen?  Clean up any spills right away.  Avoid walking on wet floors.  Keep items that you use a lot in easy-to-reach places.  If you need to reach something above you, use a strong step stool that has a grab bar.  Keep electrical cords out of the way.  Do not use floor polish or wax that makes floors slippery. If  you must use wax, use non-skid floor wax.  Do not have throw rugs and other things on the floor that can make you trip. What can I do with my stairs?  Do not leave any items on the stairs.  Make sure that there are handrails on both sides of the stairs and use them. Fix handrails that are broken or loose. Make sure that handrails are as long as the stairways.  Check any carpeting to make sure that it is firmly attached to the stairs. Fix any carpet that is loose or worn.  Avoid having throw rugs at the top or bottom of the stairs. If you do have throw rugs, attach them to the floor with carpet tape.  Make sure that you have a light switch at the top of the stairs and the bottom of the stairs. If you do not have them, ask someone to add them for you. What else can I do to help prevent falls?  Wear shoes that:  Do not have high heels.  Have rubber bottoms.  Are comfortable and fit you well.  Are closed at the toe. Do not wear sandals.  If you use a stepladder:  Make sure that it is fully opened. Do not climb a closed stepladder.  Make sure that both sides of the stepladder are locked into place.  Ask someone to hold it for you, if possible.  Clearly mark and make sure that you can see:  Any grab bars or handrails.  First and last steps.  Where the edge of each step is.  Use tools that help you move around (mobility aids) if they are needed. These include:  Canes.  Walkers.  Scooters.  Crutches.  Turn on the lights when you go into a dark area. Replace any light bulbs as soon as they burn out.  Set up your furniture so you have a clear path. Avoid moving your furniture around.  If any of your floors are uneven, fix them.  If there are any pets around you, be aware of where they are.  Review your medicines with your doctor. Some medicines can make you feel dizzy. This can increase your chance of falling. Ask your doctor what other things that you can do to  help prevent falls. This information is not intended to replace advice given to you by your health care provider. Make sure you discuss any questions you have with your health care provider. Document Released: 07/27/2009 Document Revised: 03/07/2016 Document Reviewed: 11/04/2014 Elsevier Interactive Patient Education  2017 [employer].

## 2021-03-09 NOTE — Progress Notes (Signed)
Subjective:   Deborah [name] is a 75 y.o. female who presents for Medicare Annual (Subsequent) preventive examination.  Review of Systems    No ROS. Medicare Wellness Visit. Additional risk factors are reflected in social history. Cardiac Risk Factors include: advanced age (>64men, >53 women);hypertension;family history of premature cardiovascular disease;dyslipidemia;obesity (BMI >30kg/m2)     Objective:    Today's Vitals   03/09/21 1227  BP: 140/80  Pulse: (!) 53  Temp: 98.2 F (36.8 C)  Weight: 196 lb (88.9 kg)  Height: 5\' 3"  (1.6 m)  PainSc: 0-No pain   Body mass index is 34.72 kg/m.  Advanced Directives 03/09/2021 01/27/2020 09/07/2017  Does Patient Have a Medical Advance Directive? Yes Yes No  Type of Advance Directive Living will;Healthcare Power of [place];Living will -  Does patient want to make changes to medical advance directive? No - Patient declined No - Patient declined -  Copy of [place] in Chart? No - copy requested No - copy requested -  Would patient like information on creating a medical advance directive? - - No - Patient declined    Current Medications (verified) Outpatient Encounter Medications as of 03/09/2021  Medication Sig  . albuterol (PROVENTIL HFA;VENTOLIN HFA) 108 (90 Base) MCG/ACT inhaler Inhale 2 puffs into the lungs every 4 (four) hours as needed for wheezing.  Marland Kitchen aspirin EC 81 MG tablet Take 81 mg by mouth daily. Swallow whole.  Marland Kitchen BYSTOLIC 20 MG TABS TAKE 1 TABLET BY MOUTH  DAILY  . Cholecalciferol (VITAMIN D3) 1000 units CAPS Take 2,000 Units by mouth daily.   . Cranberry-Vitamin C 15000-100 MG CAPS Take by mouth.  . hydrochlorothiazide (HYDRODIURIL) 25 MG tablet TAKE 1 TABLET BY MOUTH AT BEDTIME  . losartan (COZAAR) 100 MG tablet Take 1 tablet (100 mg total) by mouth daily.  . Multiple Vitamins-Minerals (CENTRUM SILVER ULTRA WOMENS PO) Take 1 tablet by mouth.  . Zinc 50  MG TABS Take by mouth.   No facility-administered encounter medications on file as of 03/09/2021.    Allergies (verified) Statins and Propoxyphene n-acetaminophen   History: Past Medical History:  Diagnosis Date  . Clotting disorder (HCC)    DVT  . 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 GERD (gastroesophageal reflux disease)   . Hyperlipidemia   . Hypertension   . Ocular migraine   . Pneumonia   . UTI (urinary tract infection) 10/10/13   E coli 70,000 colonies   Past Surgical History:  Procedure Laterality Date  . DVT     2-3 X ; on BCP with first  . ENDOVENOUS ABLATION SAPHENOUS VEIN W/ LASER  4098   complicated by DVT   . ENDOVENOUS ABLATION SAPHENOUS VEIN W/ LASER  2013   Dr Renaldo Reel  . no colonoscopy     SOC reviewed  . TONSILLECTOMY    . TUBAL LIGATION     G3 P3  . WISDOM TOOTH EXTRACTION     Family History  Problem Relation Age of Onset  . Arthritis Mother        OA  . Melanoma Mother 62       in situ  . Transient ischemic attack Mother        late 38s  . COPD Mother   . Cancer Father        renal cancer  . Diabetes Father   . Heart attack Father        in  50s  . Hypertension Father   . Asthma Father   . Hypertension Sister   . Asthma Sister   . Kidney disease Sister        ? from Celebrex  . Kidney failure Sister   . Breast cancer Sister   . Diabetes Brother   . Emphysema Paternal Grandfather   . Stroke Paternal Grandfather        in 65s  . Stroke Paternal Grandmother        in 53s  . Diabetes Paternal Grandmother   . Hypertension Paternal Grandmother   . Other Sister        Chron's or UC  . Breast cancer Cousin   . Colon polyps Neg Hx   . Esophageal cancer Neg Hx   . Pancreatic cancer Neg Hx   . Rectal cancer Neg Hx   . Stomach cancer Neg Hx    Social History   Socioeconomic History  . Marital status: Married    Spouse name: Not on file  . Number of children: 3  . Years of education: Not on file  . Highest education  level: Not on file  Occupational History  . Occupation: retired Therapist, sports  Tobacco Use  . Smoking status: Former Smoker    Quit date: 10/14/1969    Years since quitting: 51.4  . Smokeless tobacco: Never Used  . Tobacco comment: Quit at age 74 (smoked x 2 years, up to 1 ppd)   Substance and Sexual Activity  . Alcohol use: Yes    Alcohol/week: 2.0 standard drinks    Types: 2 Glasses of wine per week    Comment: Red wine (2 glasses weekly or less)   . Drug use: No  . Sexual activity: Not on file  Other Topics Concern  . Not on file  Social History Narrative  . Not on file   Social Determinants of Health   Financial Resource Strain: Low Risk   . Difficulty of Paying Living Expenses: Not hard at all  Food Insecurity: No Food Insecurity  . Worried About Charity fundraiser in the Last Year: Never true  . Ran Out of Food in the Last Year: Never true  Transportation Needs: No Transportation Needs  . Lack of Transportation (Medical): No  . Lack of Transportation (Non-Medical): No  Physical Activity: Sufficiently Active  . Days of Exercise per Week: 5 days  . Minutes of Exercise per Session: 60 min  Stress: No Stress Concern Present  . Feeling of Stress : Not at all  Social Connections: Socially Integrated  . Frequency of Communication with Friends and Family: More than three times a week  . Frequency of Social Gatherings with Friends and Family: More than three times a week  . Attends Religious Services: More than 4 times per year  . Active Member of Clubs or Organizations: Yes  . Attends Archivist Meetings: More than 4 times per year  . 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 no  Interpreter Needed?: No  Information entered by :: Lisette Abu, LPN   Activities of Daily Living In your present state of health, do you have any difficulty performing the following activities: 03/09/2021  Hearing? N  Vision? N  Difficulty concentrating or making decisions? N  Walking or climbing stairs? N  Dressing or bathing? N  Doing errands, shopping? N  Preparing Food and eating ? N  Using the Toilet? N  In the past six months, have you accidently leaked urine? N  Do you have problems with loss of bowel control? N  Managing your Medications? N  Managing your Finances? N  Housekeeping or managing your Housekeeping? N  Some recent data might be hidden    Patient Care Team: Binnie Rail, MD as PCP - General (Internal Medicine) Belva Crome, MD as PCP - Cardiology (Cardiology)  Indicate any recent Medical Services you may have received from other than [facility] providers in the past year (date may be approximate).     Assessment:   This is a routine wellness examination for Deborah [name].  Hearing/Vision screen No exam data present  Dietary issues and exercise activities discussed: Current Exercise Habits: Home exercise routine, Type of exercise: walking, Time (Minutes): 60, Frequency (Times/Week): 5, Weekly Exercise (Minutes/Week): 300, Intensity: Intense (brisk walking 3-5 miles per day and wt bearing), Exercise limited by: None identified  Goals Addressed   None    Depression Screen PHQ 2/9 Scores 03/09/2021 01/27/2020 01/24/2016 10/25/2013  PHQ - 2 Score 0 0 0 0    Fall Risk Fall Risk  03/09/2021 01/27/2020 03/10/2018 03/10/2018 01/24/2016  Falls in the past year? 0 0 No No No  Number falls in past yr: 0 0 - -  -  Injury with Fall? 0 0 - - -  Comment - twisted her ankle - - -  Risk for fall due to : No Fall Risks No Fall Risks - - -  Follow up Falls evaluation completed Falls evaluation completed;Education provided - - -    FALL RISK PREVENTION PERTAINING TO THE HOME:  Any stairs in or around the home? Yes  If so, are there any without handrails? No  Home free of loose throw rugs in walkways, pet beds, electrical cords, etc? Yes  Adequate lighting in your home to reduce risk of falls? Yes   ASSISTIVE DEVICES UTILIZED TO PREVENT FALLS:  Life alert? No  Use of a cane, walker or w/c? No  Grab bars in the bathroom? Yes  Shower chair or bench in shower? Yes  Elevated toilet seat or a handicapped toilet? No   TIMED UP AND GO:  Was the test performed? No .  Length of time to ambulate 10 feet: 0 sec.   Gait steady and fast without use of assistive device  Cognitive Function: Normal cognitive status assessed by direct observation by this Nurse Health Advisor. No abnormalities found.       6CIT Screen 01/27/2020  What Year? 0 points  What month? 0 points  What time? 0 points  Count back from 20 0 points  Months in reverse 0 points  Repeat phrase 0 points  Total Score 0    Immunizations Immunization History  Administered Date(s) Administered  . Influenza Split 07/29/2012  . Influenza Whole 07/19/2008  . Influenza, High Dose Seasonal PF 06/26/2017, 08/02/2018, 06/26/2019  . Influenza,inj,Quad PF,6+ Mos 08/10/2013  . Influenza-Unspecified 07/31/2014, 08/11/2015, 06/09/2020  .  Moderna SARS-COV2 Booster Vaccination 08/04/2020  . Moderna Sars-Covid-2 Vaccination 11/15/2019, 12/24/2019  . Pneumococcal Conjugate-13 10/20/2015  . Pneumococcal Polysaccharide-23 10/25/2013  . Tdap 04/02/2011    TDAP status: Due, Education has been provided regarding the importance of this vaccine. Advised may receive this vaccine at local pharmacy or Health Dept. Aware to provide a copy of the  vaccination record if obtained from local pharmacy or Health Dept. Verbalized acceptance and understanding.  Flu Vaccine status: Up to date  Pneumococcal vaccine status: Up to date  Covid-19 vaccine status: Completed vaccines  Qualifies for Shingles Vaccine? Yes   Zostavax completed No   Shingrix Completed?: No.    Education has been provided regarding the importance of this vaccine. Patient has been advised to call insurance company to determine out of pocket expense if they have not yet received this vaccine. Advised may also receive vaccine at local pharmacy or Health Dept. Verbalized acceptance and understanding.  Screening Tests Health Maintenance  Topic Date Due  . Zoster Vaccines- Shingrix (1 of 2) Never done  . TETANUS/TDAP  04/01/2021  . INFLUENZA VACCINE  05/14/2021  . DEXA SCAN  05/18/2021  . MAMMOGRAM  06/05/2022  . COLONOSCOPY (Pts 45-77yrs Insurance coverage will need to be confirmed)  03/05/2026  . COVID-19 Vaccine  Completed  . Hepatitis C Screening  Completed  . PNA vac Low Risk Adult  Completed  . HPV VACCINES  Aged Out    Health Maintenance  Health Maintenance Due  Topic Date Due  . Zoster Vaccines- Shingrix (1 of 2) Never done    Colorectal cancer screening: Type of screening: Colonoscopy. Completed 03/05/2016. Repeat every 10 years  Mammogram status: Completed 06/05/2020. Repeat every year  Bone Density status: Completed 05/19/2019. Results reflect: Bone density results: OSTEOPENIA. Repeat every 2 years.  Lung Cancer Screening: (Low Dose CT Chest recommended if Age 44-80 years, 30 pack-year currently smoking OR have quit w/in 15years.) does not qualify.   Lung Cancer Screening Referral: no  Additional Screening:  Hepatitis C Screening: does qualify; Completed yes  Vision Screening: Recommended annual ophthalmology exams for early detection of glaucoma and other disorders of the eye. Is the patient up to date with their annual eye exam?  Yes  Who is  the provider or what is the name of the office in which the patient attends annual eye exams? [facility] If pt is not established with a provider, would they like to be referred to a provider to establish care? No .   Dental Screening: Recommended annual dental exams for proper oral hygiene  Community Resource Referral / Chronic Care Management: CRR required this visit?  No   CCM required this visit?  No      Plan:     I have personally reviewed and noted the following in the patient's chart:   . Medical and social history . Use of alcohol, tobacco or illicit drugs  . Current medications and supplements including opioid prescriptions.  . Functional ability and status . Nutritional status . Physical activity . Advanced directives . List of other physicians . Hospitalizations, surgeries, and ER visits in previous 12 months . Vitals . Screenings to include cognitive, depression, and falls . Referrals and appointments  In addition, I have reviewed and discussed with patient certain preventive protocols, quality metrics, and best practice recommendations. A written personalized care plan for preventive services as well as general preventive health recommendations were provided to patient.     Jule Ser  [employer] [employer], [place]   0/98/1191   Nurse Notes: Medications reviewed with patient; no opioid use noted.

## 2021-04-06 ENCOUNTER — Other Ambulatory Visit: Payer: Self-pay | Admitting: Interventional Cardiology

## 2021-04-23 ENCOUNTER — Other Ambulatory Visit: Payer: Self-pay | Admitting: Obstetrics and Gynecology

## 2021-04-23 DIAGNOSIS — Z1231 Encounter for screening mammogram for malignant neoplasm of breast: Secondary | ICD-10-CM

## 2021-05-05 ENCOUNTER — Other Ambulatory Visit: Payer: Self-pay | Admitting: Interventional Cardiology

## 2021-05-20 ENCOUNTER — Other Ambulatory Visit: Payer: Self-pay | Admitting: Interventional Cardiology

## 2021-06-16 ENCOUNTER — Other Ambulatory Visit: Payer: Self-pay | Admitting: Interventional Cardiology

## 2021-06-18 ENCOUNTER — Other Ambulatory Visit: Payer: Self-pay | Admitting: Interventional Cardiology

## 2021-06-19 ENCOUNTER — Other Ambulatory Visit: Payer: Self-pay

## 2021-06-19 ENCOUNTER — Encounter: Payer: Self-pay | Admitting: Gastroenterology

## 2021-06-19 ENCOUNTER — Ambulatory Visit
Admission: RE | Admit: 2021-06-19 | Discharge: 2021-06-19 | Disposition: A | Discharge status: Home / Self Care | Payer: Medicare Other | Source: Ambulatory Visit | Attending: Obstetrics and Gynecology | Admitting: Obstetrics and Gynecology

## 2021-06-19 DIAGNOSIS — Z1231 Encounter for screening mammogram for malignant neoplasm of breast: Secondary | ICD-10-CM | POA: Diagnosis not present

## 2021-06-20 NOTE — Progress Notes (Signed)
Subjective:    Patient ID: Deborah [name], female    DOB: [birth year], 75 y.o.   MRN: [id number]   This visit occurred during the SARS-CoV-2 public health emergency.  Safety protocols were in place, including screening questions prior to the visit, additional usage of staff PPE, and extensive cleaning of exam room while observing appropriate contact time as indicated for disinfecting solutions.    HPI She is here for a physical exam.   She has no concerns.  She does monitor her blood pressure at home consistently and it is well controlled.  Medications and allergies reviewed with patient and updated if appropriate.  Patient Active Problem List   Diagnosis Date Noted   Injury of calf 09/15/2020   Hyperglycemia 06/26/2017   Osteopenia 12/15/2016   Cough 09/10/2016   SVT (supraventricular tachycardia) (HCC) - Dr Daneen Schick 03/17/2014   Asthma 07/29/2012   Hyperlipidemia 07/19/2008   Essential hypertension 07/19/2008   DVT, HX OF 07/19/2008    Current Outpatient Medications on File Prior to Visit  Medication Sig Dispense Refill   albuterol (PROVENTIL HFA;VENTOLIN HFA) 108 (90 Base) MCG/ACT inhaler Inhale 2 puffs into the lungs every 4 (four) hours as needed for wheezing. 1 Inhaler 2   aspirin EC 81 MG tablet Take 81 mg by mouth daily. Swallow whole.     Cholecalciferol (VITAMIN D3) 1000 units CAPS Take 2,000 Units by mouth daily.      Cranberry-Vitamin C 15000-100 MG CAPS Take by mouth.     hydrochlorothiazide (HYDRODIURIL) 25 MG tablet Take 1 tablet (25 mg total) by mouth at bedtime. Please make yearly appt with Dr. Tamala Julian in August 2022 for future refills. Thank you 1st attempt 90 tablet 0   losartan (COZAAR) 100 MG tablet Take 1 tablet (100 mg total) by mouth daily. Please keep upcoming appt in February 2023 with Dr. Tamala Julian before anymore refills. Thank you 30 tablet 5   Multiple Vitamins-Minerals (CENTRUM SILVER ULTRA WOMENS PO) Take 1 tablet by mouth.      Nebivolol HCl 20 MG TABS TAKE 1 TABLET BY MOUTH  DAILY 30 tablet 5   No current facility-administered medications on file prior to visit.    Past Medical History:  Diagnosis Date   Clotting disorder ([place])    DVT   DVT (deep venous thrombosis) (HCC)    2-3X   GERD (gastroesophageal reflux disease)    Hyperlipidemia    Hypertension    Ocular migraine    Pneumonia    UTI (urinary tract infection) 10/10/13   E coli 70,000 colonies    Past Surgical History:  Procedure Laterality Date   DVT     2-3 X ; on BCP with first   ENDOVENOUS ABLATION SAPHENOUS VEIN W/ LASER  123456   complicated by DVT    ENDOVENOUS ABLATION SAPHENOUS VEIN W/ LASER  2013   Dr Renaldo Reel   no colonoscopy     [place] reviewed   TONSILLECTOMY     TUBAL LIGATION     G3 P3   WISDOM TOOTH EXTRACTION      Social History   Socioeconomic History   Marital status: Married    Spouse name: Not on file   Number of children: 3   Years of education: Not on file   Highest education level: Not on file  Occupational History   Occupation: retired Therapist, sports  Tobacco Use   Smoking status: Former    Types: Cigarettes    Quit date: 10/14/1969  Years since quitting: 51.7   Smokeless tobacco: Never   Tobacco comments:    Quit at age 72 (smoked x 2 years, up to 1 ppd)   Substance and Sexual Activity   Alcohol use: Yes    Alcohol/week: 2.0 standard drinks    Types: 2 Glasses of wine per week    Comment: Red wine (2 glasses weekly or less)    Drug use: No   Sexual activity: Not on file  Other Topics Concern   Not on file  Social History Narrative   Not on file   Social Determinants of Health   Financial Resource Strain: Low Risk    Difficulty of Paying Living Expenses: Not hard at all  Food Insecurity: No Food Insecurity   Worried About Charity fundraiser in the Last Year: Never true   Ran Out of Food in the Last Year: Never true  Transportation Needs: No Transportation Needs   Lack of Transportation (Medical):  No   Lack of Transportation (Non-Medical): No  Physical Activity: Sufficiently Active   Days of Exercise per Week: 5 days   Minutes of Exercise per Session: 60 min  Stress: No Stress Concern Present   Feeling of Stress : Not at all  Social Connections: Socially Integrated   Frequency of Communication with Friends and Family: More than three times a week   Frequency of Social Gatherings with Friends and Family: More than three times a week   Attends Religious Services: More than 4 times per year   Active Member of [employer] or Organizations: Yes   Attends Music therapist: More than 4 times per year   Marital Status: Married    Family History  Problem Relation Age of Onset   Arthritis Mother        OA   Melanoma Mother 34       in situ   Transient ischemic attack Mother        late 54s   COPD Mother    Cancer Father        renal cancer   Diabetes Father    Heart attack Father        in 23s   Hypertension Father    Asthma Father    Hypertension Sister    Asthma Sister    Kidney disease Sister        ? from Celebrex   Kidney failure Sister    Breast cancer Sister 45   Other Sister        Chron's or UC   Stroke Paternal Grandmother        in 45s   Diabetes Paternal Grandmother    Hypertension Paternal Grandmother    Emphysema Paternal Grandfather    Stroke Paternal Grandfather        in 36s   Breast cancer Cousin        early 1's   Diabetes Brother    Colon polyps Neg Hx    Esophageal cancer Neg Hx    Pancreatic cancer Neg Hx    Rectal cancer Neg Hx    Stomach cancer Neg Hx     Review of Systems  Constitutional:  Negative for chills and fever.  Eyes:  Negative for visual disturbance.  Respiratory:  Positive for shortness of breath (mild with huge hills). Negative for cough and wheezing.   Cardiovascular:  Positive for palpitations (occ). Negative for chest pain and leg swelling.  Gastrointestinal:  Negative for abdominal pain, blood in stool,  constipation, diarrhea and nausea.       No gerd  Genitourinary:  Negative for dysuria and hematuria.  Musculoskeletal:  Positive for arthralgias (hands). Negative for back pain.  Skin:  Negative for rash.  Neurological:  Negative for light-headedness and headaches.  Psychiatric/Behavioral:  Negative for dysphoric mood. The patient is not nervous/anxious.       Objective:   Vitals:   06/21/21 1006  BP: (!) 162/90  Pulse: 61  Temp: 98.5 F (36.9 C)  SpO2: 96%   Filed Weights   06/21/21 1006  Weight: 193 lb (87.5 kg)   Body mass index is 34.19 kg/m.  BP Readings from Last 3 Encounters:  06/21/21 (!) 162/90  03/09/21 140/80  10/11/20 (!) 150/90    Wt Readings from Last 3 Encounters:  06/21/21 193 lb (87.5 kg)  03/09/21 196 lb (88.9 kg)  09/14/20 194 lb (88 kg)     Physical Exam Constitutional: She appears well-developed and well-nourished. No distress.  HENT:  Head: Normocephalic and atraumatic.  Right Ear: External ear normal. Normal ear canal and TM Left Ear: External ear normal.  Normal ear canal and TM Mouth/Throat: Oropharynx is clear and moist.  Eyes: Conjunctivae and EOM are normal.  Neck: Neck supple. No tracheal deviation present. No thyromegaly present.  No carotid bruit  Cardiovascular: Normal rate, regular rhythm and normal heart sounds.   No murmur heard.  No edema. Pulmonary/Chest: Effort normal and breath sounds normal. No respiratory distress. She has no wheezes. She has no rales.  Breast: deferred   Abdominal: Soft. She exhibits no distension. There is no tenderness.  Lymphadenopathy: She has no cervical adenopathy.  Skin: Skin is warm and dry. She is not diaphoretic.  Psychiatric: She has a normal mood and affect. Her behavior is normal.     Lab Results  Component Value Date   WBC 7.0 06/30/2019   HGB 15.3 (H) 06/30/2019   HCT 45.7 06/30/2019   PLT 261.0 06/30/2019   GLUCOSE 99 05/19/2019   CHOL 194 06/30/2019   TRIG 98.0 06/30/2019    HDL 61.40 06/30/2019   LDLDIRECT 147.1 07/30/2012   LDLCALC 113 (H) 06/30/2019   ALT 16 05/19/2019   AST 17 05/19/2019   NA 140 05/19/2019   K 3.5 05/19/2019   CL 102 05/19/2019   CREATININE 0.98 05/19/2019   BUN 25 (H) 05/19/2019   CO2 26 05/19/2019   TSH 3.18 05/19/2019   HGBA1C 5.4 05/19/2019         Assessment & Plan:   Physical exam: Screening blood work  ordered Exercise active-Will restart regular walking unable Weight  encouraged weight loss Substance abuse  none   Reviewed recommended immunizations.   Health Maintenance  Topic Date Due   Zoster Vaccines- Shingrix (1 of 2) Never done   TETANUS/TDAP  04/01/2021   DEXA SCAN  05/18/2021   MAMMOGRAM  06/20/2023   COLONOSCOPY (Pts 45-38yr Insurance coverage will need to be confirmed)  03/05/2026   INFLUENZA VACCINE  Completed   COVID-19 Vaccine  Completed   Hepatitis C Screening  Completed   PNA vac Low Risk Adult  Completed   HPV VACCINES  Aged Out          See Problem List for Assessment and Plan of chronic medical problems.

## 2021-06-20 NOTE — Patient Instructions (Addendum)
Blood work was ordered.     Medications changes include :   none      Please followup in 1 year    Health Maintenance, Female Adopting a healthy lifestyle and getting preventive care are important in promoting health and wellness. Ask your health care provider about: The right schedule for you to have regular tests and exams. Things you can do on your own to prevent diseases and keep yourself healthy. What should I know about diet, weight, and exercise? Eat a healthy diet  Eat a diet that includes plenty of vegetables, fruits, low-fat dairy products, and lean protein. Do not eat a lot of foods that are high in solid fats, added sugars, or sodium. Maintain a healthy weight Body mass index (BMI) is used to identify weight problems. It estimates body fat based on height and weight. Your health care provider can help determine your BMI and help you achieve or maintain a healthy weight. Get regular exercise Get regular exercise. This is one of the most important things you can do for your health. Most adults should: Exercise for at least 150 minutes each week. The exercise should increase your heart rate and make you sweat (moderate-intensity exercise). Do strengthening exercises at least twice a week. This is in addition to the moderate-intensity exercise. Spend less time sitting. Even light physical activity can be beneficial. Watch cholesterol and blood lipids Have your blood tested for lipids and cholesterol at 74 years of age, then have this test every 5 years. Have your cholesterol levels checked more often if: Your lipid or cholesterol levels are high. You are older than 74 years of age. You are at high risk for heart disease. What should I know about cancer screening? Depending on your health history and family history, you may need to have cancer screening at various ages. This may include screening for: Breast cancer. Cervical cancer. Colorectal cancer. Skin  cancer. Lung cancer. What should I know about heart disease, diabetes, and high blood pressure? Blood pressure and heart disease High blood pressure causes heart disease and increases the risk of stroke. This is more likely to develop in people who have high blood pressure readings, are of African descent, or are overweight. Have your blood pressure checked: Every 3-5 years if you are 21-80 years of age. Every year if you are 51 years old or older. Diabetes Have regular diabetes screenings. This checks your fasting blood sugar level. Have the screening done: Once every three years after age 5 if you are at a normal weight and have a low risk for diabetes. More often and at a younger age if you are overweight or have a high risk for diabetes. What should I know about preventing infection? Hepatitis B If you have a higher risk for hepatitis B, you should be screened for this virus. Talk with your health care provider to find out if you are at risk for hepatitis B infection. Hepatitis C Testing is recommended for: Everyone born from [birth year] through 1964. Anyone with known risk factors for hepatitis C. Sexually transmitted infections (STIs) Get screened for STIs, including gonorrhea and chlamydia, if: You are sexually active and are younger than 74 years of age. You are older than 74 years of age and your health care provider tells you that you are at risk for this type of infection. Your sexual activity has changed since you were last screened, and you are at increased risk for chlamydia or gonorrhea. Ask your health care provider  if you are at risk. Ask your health care provider about whether you are at high risk for HIV. Your health care provider may recommend a prescription medicine to help prevent HIV infection. If you choose to take medicine to prevent HIV, you should first get tested for HIV. You should then be tested every 3 months for as long as you are taking the medicine. Pregnancy If  you are about to stop having your period (premenopausal) and you may become pregnant, seek counseling before you get pregnant. Take 400 to 800 micrograms (mcg) of folic acid every day if you become pregnant. Ask for birth control (contraception) if you want to prevent pregnancy. Osteoporosis and menopause Osteoporosis is a disease in which the bones lose minerals and strength with aging. This can result in bone fractures. If you are 67 years old or older, or if you are at risk for osteoporosis and fractures, ask your health care provider if you should: Be screened for bone loss. Take a calcium or vitamin D supplement to lower your risk of fractures. Be given hormone replacement therapy (HRT) to treat symptoms of menopause. Follow these instructions at home: Lifestyle Do not use any products that contain nicotine or tobacco, such as cigarettes, e-cigarettes, and chewing tobacco. If you need help quitting, ask your health care provider. Do not use street drugs. Do not share needles. Ask your health care provider for help if you need support or information about quitting drugs. Alcohol use Do not drink alcohol if: Your health care provider tells you not to drink. You are pregnant, may be pregnant, or are planning to become pregnant. If you drink alcohol: Limit how much you use to 0-1 drink a day. Limit intake if you are breastfeeding. Be aware of how much alcohol is in your drink. In the [place] one drink equals one 12 oz bottle of beer (355 mL), one 5 oz glass of wine (148 mL), or one 1 oz glass of hard liquor (44 mL). General instructions Schedule regular health, dental, and eye exams. Stay current with your vaccines. Tell your health care provider if: You often feel depressed. You have ever been abused or do not feel safe at home. Summary Adopting a healthy lifestyle and getting preventive care are important in promoting health and wellness. Follow your health care provider's  instructions about healthy diet, exercising, and getting tested or screened for diseases. Follow your health care provider's instructions on monitoring your cholesterol and blood pressure. This information is not intended to replace advice given to you by your health care provider. Make sure you discuss any questions you have with your health care provider. Document Revised: 12/08/2020 Document Reviewed: 09/23/2018 Elsevier Patient Education  2022 [employer].

## 2021-06-21 ENCOUNTER — Other Ambulatory Visit: Payer: Self-pay

## 2021-06-21 ENCOUNTER — Encounter: Payer: Self-pay | Admitting: Internal Medicine

## 2021-06-21 ENCOUNTER — Ambulatory Visit (INDEPENDENT_AMBULATORY_CARE_PROVIDER_SITE_OTHER): Payer: Medicare Other | Admitting: Internal Medicine

## 2021-06-21 VITALS — BP 162/90 | HR 61 | Temp 98.5°F | Ht 63.0 in | Wt 193.0 lb

## 2021-06-21 DIAGNOSIS — Z0000 Encounter for general adult medical examination without abnormal findings: Secondary | ICD-10-CM | POA: Diagnosis not present

## 2021-06-21 DIAGNOSIS — E7849 Other hyperlipidemia: Secondary | ICD-10-CM | POA: Diagnosis not present

## 2021-06-21 DIAGNOSIS — I471 Supraventricular tachycardia: Secondary | ICD-10-CM | POA: Diagnosis not present

## 2021-06-21 DIAGNOSIS — M85851 Other specified disorders of bone density and structure, right thigh: Secondary | ICD-10-CM | POA: Diagnosis not present

## 2021-06-21 DIAGNOSIS — M85852 Other specified disorders of bone density and structure, left thigh: Secondary | ICD-10-CM | POA: Diagnosis not present

## 2021-06-21 DIAGNOSIS — R739 Hyperglycemia, unspecified: Secondary | ICD-10-CM | POA: Diagnosis not present

## 2021-06-21 DIAGNOSIS — J4521 Mild intermittent asthma with (acute) exacerbation: Secondary | ICD-10-CM | POA: Diagnosis not present

## 2021-06-21 DIAGNOSIS — Z23 Encounter for immunization: Secondary | ICD-10-CM | POA: Diagnosis not present

## 2021-06-21 DIAGNOSIS — I10 Essential (primary) hypertension: Secondary | ICD-10-CM | POA: Diagnosis not present

## 2021-06-21 LAB — COMPREHENSIVE METABOLIC PANEL
ALT: 25 U/L (ref 0–35)
AST: 22 U/L (ref 0–37)
Albumin: 4.6 g/dL (ref 3.5–5.2)
Alkaline Phosphatase: 55 U/L (ref 39–117)
BUN: 15 mg/dL (ref 6–23)
CO2: 32 mEq/L (ref 19–32)
Calcium: 10.3 mg/dL (ref 8.4–10.5)
Chloride: 101 mEq/L (ref 96–112)
Creatinine, Ser: 0.98 mg/dL (ref 0.40–1.20)
GFR: 57 mL/min — ABNORMAL LOW (ref >60.00)
Glucose, Bld: 113 mg/dL — ABNORMAL HIGH (ref 70–99)
Potassium: 4 mEq/L (ref 3.5–5.1)
Sodium: 141 mEq/L (ref 135–145)
Total Bilirubin: 0.9 mg/dL (ref 0.2–1.2)
Total Protein: 7.9 g/dL (ref 6.0–8.3)

## 2021-06-21 LAB — CBC WITH DIFFERENTIAL/PLATELET
Basophils Absolute: 0 10*3/uL (ref 0.0–0.1)
Basophils Relative: 0.6 % (ref 0.0–3.0)
Eosinophils Absolute: 0.2 10*3/uL (ref 0.0–0.7)
Eosinophils Relative: 3.1 % (ref 0.0–5.0)
HCT: 47.1 % — ABNORMAL HIGH (ref 36.0–46.0)
Hemoglobin: 15.8 g/dL — ABNORMAL HIGH (ref 12.0–15.0)
Lymphocytes Relative: 18.4 % (ref 12.0–46.0)
Lymphs Abs: 1.3 10*3/uL (ref 0.7–4.0)
MCHC: 33.5 g/dL (ref 30.0–36.0)
MCV: 89.3 fl (ref 78.0–100.0)
Monocytes Absolute: 0.5 10*3/uL (ref 0.1–1.0)
Monocytes Relative: 6.6 % (ref 3.0–12.0)
Neutro Abs: 4.9 10*3/uL (ref 1.4–7.7)
Neutrophils Relative %: 71.3 % (ref 43.0–77.0)
Platelets: 273 10*3/uL (ref 150.0–400.0)
RBC: 5.28 Mil/uL — ABNORMAL HIGH (ref 3.87–5.11)
RDW: 12.7 % (ref 11.5–15.5)
WBC: 6.9 10*3/uL (ref 4.0–10.5)

## 2021-06-21 LAB — LIPID PANEL
Cholesterol: 232 mg/dL — ABNORMAL HIGH (ref 0–200)
HDL: 65.8 mg/dL (ref >39.00)
LDL Cholesterol: 146 mg/dL — ABNORMAL HIGH (ref 0–99)
NonHDL: 166.03
Total CHOL/HDL Ratio: 4
Triglycerides: 100 mg/dL (ref 0.0–149.0)
VLDL: 20 mg/dL (ref 0.0–40.0)

## 2021-06-21 LAB — TSH: TSH: 2.2 u[IU]/mL (ref 0.35–5.50)

## 2021-06-21 LAB — HEMOGLOBIN A1C: Hgb A1c MFr Bld: 5.4 % (ref 4.6–6.5)

## 2021-06-21 NOTE — Assessment & Plan Note (Signed)
Chronic Statin intolerant Will check lipid panel, CMP, TSH She is fairly compliant with a low cholesterol diet

## 2021-06-21 NOTE — Assessment & Plan Note (Signed)
Chronic DEXA due-ordered She is active and typically walks regularly when she is able Continue multivitamin, vitamin D

## 2021-06-21 NOTE — Assessment & Plan Note (Signed)
Chronic Controlled Managed by Dr. Tamala Julian On Bystolic 20 mg daily

## 2021-06-21 NOTE — Assessment & Plan Note (Signed)
Chronic Check a1c Low sugar / carb diet Stressed regular exercise  

## 2021-06-21 NOTE — Assessment & Plan Note (Signed)
Chronic Mild, intermittent Controlled Continue albuterol inhaler as needed 

## 2021-06-21 NOTE — Assessment & Plan Note (Signed)
Chronic Blood pressure well controlled at home-she does have an element of whitecoat hypertension and her blood pressure tends to be elevated at doctor's offices Will go by home measures Continue HCTZ 25 mg daily, losartan [id number] mg daily, Bystolic 20 mg daily CMP

## 2021-06-22 ENCOUNTER — Encounter: Payer: Self-pay | Admitting: Internal Medicine

## 2021-06-22 DIAGNOSIS — E782 Mixed hyperlipidemia: Secondary | ICD-10-CM

## 2021-06-24 ENCOUNTER — Other Ambulatory Visit: Payer: Self-pay | Admitting: Interventional Cardiology

## 2021-06-25 DIAGNOSIS — I83893 Varicose veins of bilateral lower extremities with other complications: Secondary | ICD-10-CM | POA: Diagnosis not present

## 2021-10-16 ENCOUNTER — Ambulatory Visit (INDEPENDENT_AMBULATORY_CARE_PROVIDER_SITE_OTHER): Payer: Medicare Other

## 2021-10-16 ENCOUNTER — Other Ambulatory Visit: Payer: Self-pay

## 2021-10-16 ENCOUNTER — Encounter: Payer: Self-pay | Admitting: Family Medicine

## 2021-10-16 ENCOUNTER — Ambulatory Visit: Payer: Medicare Other | Admitting: Family Medicine

## 2021-10-16 VITALS — BP 160/90 | HR 70 | Ht 63.0 in | Wt 194.0 lb

## 2021-10-16 DIAGNOSIS — M545 Low back pain, unspecified: Secondary | ICD-10-CM | POA: Diagnosis not present

## 2021-10-16 DIAGNOSIS — M5136 Other intervertebral disc degeneration, lumbar region: Secondary | ICD-10-CM | POA: Diagnosis not present

## 2021-10-16 DIAGNOSIS — M2578 Osteophyte, vertebrae: Secondary | ICD-10-CM | POA: Diagnosis not present

## 2021-10-16 DIAGNOSIS — M25552 Pain in left hip: Secondary | ICD-10-CM

## 2021-10-16 MED ORDER — GABAPENTIN 100 MG PO CAPS: 200.0000 mg | ORAL_CAPSULE | Freq: Every day | ORAL | 0 refills | Status: DC

## 2021-10-16 MED ORDER — PREDNISONE 20 MG PO TABS: 40.0000 mg | ORAL_TABLET | Freq: Every day | ORAL | 0 refills | Status: DC

## 2021-10-16 NOTE — Assessment & Plan Note (Signed)
Significant degenerative disc disease noted.  Discussed icing regimen and home exercise, discussed which activities to do which wants to avoid.  Increase activity slowly.  Follow-up again in 4 to 8 weeks after patient responds to the prednisone and gabapentin.  May need advanced imaging if this continues.

## 2021-10-16 NOTE — Progress Notes (Signed)
[place] Phone: [phone] Subjective:   Fontaine No, am serving as a scribe for Dr. Hulan Saas.This visit occurred during the SARS-CoV-2 public health emergency.  Safety protocols were in place, including screening questions prior to the visit, additional usage of staff PPE, and extensive cleaning of exam room while observing appropriate contact time as indicated for disinfecting solutions.  I'm seeing this patient by the request  of:  Binnie Rail, MD  CC: Left hip and back pain  [id number]  Deborah [name] is a 76 y.o. female coming in with complaint of L hip pain in groin over past 3 weeks. Seen in 2021 for R calf pain. 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 Notes that she use to be able to put hands on floor 3 weeks ago with legs straight but is unable to do so now. Does have pain and numbness in posterior aspect of L knee and calf. 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 Using naproxen for pain relief.       Past Medical History:  Diagnosis Date   Clotting disorder ([place])    DVT   DVT (deep venous thrombosis) ([facility])    2-3X   GERD (gastroesophageal reflux disease)    Hyperlipidemia    Hypertension    Ocular migraine    Pneumonia    UTI (urinary tract infection) 10/10/13   E coli 70,000 colonies   Past Surgical History:  Procedure Laterality Date   DVT     2-3 X ; on BCP with first   ENDOVENOUS ABLATION SAPHENOUS VEIN W/ LASER  3825   complicated by DVT    ENDOVENOUS ABLATION SAPHENOUS VEIN W/ LASER  2013   Dr Renaldo Reel   no colonoscopy     [place] reviewed   TONSILLECTOMY     TUBAL LIGATION     G3 P3   WISDOM TOOTH EXTRACTION     Social History   Socioeconomic History   Marital status: Married    Spouse name: Not on file   Number of children: 3   Years of education: Not on file   Highest education level: Not on file  Occupational History   Occupation:  retired Therapist, sports  Tobacco Use   Smoking status: Former    Types: Cigarettes    Quit date: 10/14/1969    Years since quitting: 52.0   Smokeless tobacco: Never   Tobacco comments:    Quit at age 24 (smoked x 2 years, up to 1 ppd)   Substance and Sexual Activity   Alcohol use: Yes    Alcohol/week: 2.0 standard drinks    Types: 2 Glasses of wine per week    Comment: Red wine (2 glasses weekly or less)    Drug use: No   Sexual activity: Not on file  Other Topics Concern   Not on file  Social History Narrative   Not on file   Social Determinants of Health   Financial Resource Strain: Low Risk    Difficulty of Paying Living Expenses: Not hard at all  Food Insecurity: No Food Insecurity   Worried About Charity fundraiser in the Last Year: Never true   Ran Out of Food in the Last Year: Never true  Transportation Needs: No Transportation Needs   Lack of Transportation (Medical): No   Lack of Transportation (Non-Medical): No  Physical Activity: Sufficiently Active   Days of Exercise per Week:  5 days   Minutes of Exercise per Session: 60 min  Stress: No Stress Concern Present   Feeling of Stress : Not at all  Social Connections: Socially Integrated   Frequency of Communication with Friends and Family: More than three times a week   Frequency of Social Gatherings with Friends and Family: More than three times a week   Attends Religious Services: More than 4 times per year   Active Member of [employer] or Organizations: Yes   Attends Music therapist: More than 4 times per year   Marital Status: Married   Allergies  Allergen Reactions   Statins     Muscle aches with some, elevated lft with crestor   Propoxyphene N-Acetaminophen     REACTION: GI Upset   Family History  Problem Relation Age of Onset   Arthritis Mother        OA   Melanoma Mother 22       in situ   Transient ischemic attack Mother        late 15s   COPD Mother    Cancer Father        renal cancer    Diabetes Father    Heart attack Father        in 69s   Hypertension Father    Asthma Father    Hypertension Sister    Asthma Sister    Kidney disease Sister        ? from Celebrex   Kidney failure Sister    Breast cancer Sister 5   Other Sister        Chron's or UC   Stroke Paternal Grandmother        in 29s   Diabetes Paternal Grandmother    Hypertension Paternal Grandmother    Emphysema Paternal Grandfather    Stroke Paternal Grandfather        in 51s   Breast cancer Cousin        early 47's   Diabetes Brother    Colon polyps Neg Hx    Esophageal cancer Neg Hx    Pancreatic cancer Neg Hx    Rectal cancer Neg Hx    Stomach cancer Neg Hx     Current Outpatient Medications (Endocrine & Metabolic):    predniSONE (DELTASONE) 20 MG tablet, Take 2 tablets (40 mg total) by mouth daily with breakfast.  Current Outpatient Medications (Cardiovascular):    hydrochlorothiazide (HYDRODIURIL) 25 MG tablet, Take 1 tablet (25 mg total) by mouth at bedtime. Please keep upcoming appt in February 2023 with Dr. Tamala Julian before anymore refills. Thank you Final Attempt   losartan (COZAAR) 100 MG tablet, Take 1 tablet (100 mg total) by mouth daily. Please keep upcoming appt in February 2023 with Dr. Tamala Julian before anymore refills. Thank you   Nebivolol HCl 20 MG TABS, TAKE 1 TABLET BY MOUTH  DAILY  Current Outpatient Medications (Respiratory):    albuterol (PROVENTIL HFA;VENTOLIN HFA) 108 (90 Base) MCG/ACT inhaler, Inhale 2 puffs into the lungs every 4 (four) hours as needed for wheezing.  Current Outpatient Medications (Analgesics):    aspirin EC 81 MG tablet, Take 81 mg by mouth daily. Swallow whole.   Current Outpatient Medications (Other):    Cholecalciferol (VITAMIN D3) 1000 units CAPS, Take 2,000 Units by mouth daily.    Cranberry-Vitamin C 15000-100 MG CAPS, Take by mouth.   gabapentin (NEURONTIN) 100 MG capsule, Take 2 capsules (200 mg total) by mouth at bedtime.   Multiple  Vitamins-Minerals (  CENTRUM SILVER ULTRA WOMENS PO), Take 1 tablet by mouth.   Reviewed prior external information including notes and imaging from  primary care provider As well as notes that were available from care everywhere and other healthcare systems.  Past medical history, social, surgical and family history all reviewed in electronic medical record.  No pertanent information unless stated regarding to the chief complaint.   Review of Systems:  No headache, visual changes, nausea, vomiting, diarrhea, constipation, dizziness, abdominal pain, skin rash, fevers, chills, night sweats, weight loss, swollen lymph nodes, body aches, joint swelling, chest pain, shortness of breath, mood changes. POSITIVE muscle aches  Objective  Blood pressure (!) 160/90, pulse 70, height 5\' 3"  (1.6 m), weight 194 lb (88 kg), SpO2 96 %.   General: No apparent distress alert and oriented x3 mood and affect normal, dressed appropriately.  HEENT: Pupils equal, extraocular movements intact  Respiratory: Patient's speak in full sentences and does not appear short of breath  Cardiovascular: No lower extremity edema, non tender, no erythema  Gait mild antalgic Left hip does have limited internal range of motion noted.  Only have 5 degrees compared to 10 degrees on the contralateral side.  Difficulty with Corky Sox left greater than right as well.  Tender to palpation of the greater trochanteric area.  Does have some loss of lordosis.  No true radicular symptoms or with straight leg test.  Neurovascularly intact distally.  [id number]; 15 additional minutes spent for Therapeutic exercises as stated in above notes.  This included exercises focusing on stretching, strengthening, with significant focus on eccentric aspects.   Long term goals include an improvement in range of motion, strength, endurance as well as avoiding reinjury. Patient's frequency would include in 1-2 times a day, 3-5 times a week for a duration of 6-12 weeks.  Hip strengthening exercises which included:  Pelvic tilt/bracing to help with proper recruitment of the lower abs and pelvic floor muscles  Glute strengthening to properly contract glutes without over-engaging low back and hamstrings - prone hip extension and glute bridge exercises Proper stretching techniques to increase effectiveness for the hip flexors, groin, quads, piriformic and low back when appropriate   Proper technique shown and discussed handout in great detail with ATC.  All questions were discussed and answered.     Impression and Recommendations:     The above documentation has been reviewed and is accurate and complete Deborah Pulley, DO

## 2021-10-16 NOTE — Patient Instructions (Addendum)
Xray today Gabapentin 200mg  Prednisone 40mg  5 days Ice See me in 4-5 weeks

## 2021-10-16 NOTE — Assessment & Plan Note (Signed)
Patient on x-rays today did have moderate to severe narrowing of the hip left greater than right.  I do believe that this is likely contributing to the majority of patient's pain.  Unfortunately I do believe that patient also has some underlying arthritic changes of the back that could be playing a role as well.  Prednisone and gabapentin given today.  Discussed home exercises and patient work with Product/process development scientist.  Worsening symptoms we may need to consider the possibility of advanced imaging versus injections.  Patient does have a history of DVT but does not appear to be this.  Patient does not feel like this is necessary for any further work-up.

## 2021-10-17 ENCOUNTER — Ambulatory Visit (INDEPENDENT_AMBULATORY_CARE_PROVIDER_SITE_OTHER)
Admission: RE | Admit: 2021-10-17 | Discharge: 2021-10-17 | Disposition: A | Discharge status: Home / Self Care | Payer: Medicare Other | Source: Ambulatory Visit | Attending: Internal Medicine | Admitting: Internal Medicine

## 2021-10-17 ENCOUNTER — Other Ambulatory Visit: Payer: Self-pay

## 2021-10-17 DIAGNOSIS — M85851 Other specified disorders of bone density and structure, right thigh: Secondary | ICD-10-CM | POA: Diagnosis not present

## 2021-10-17 DIAGNOSIS — M85852 Other specified disorders of bone density and structure, left thigh: Secondary | ICD-10-CM

## 2021-10-17 MED ORDER — PREDNISONE 20 MG PO TABS: 40.0000 mg | ORAL_TABLET | Freq: Every day | ORAL | 0 refills | Status: DC

## 2021-10-17 MED ORDER — GABAPENTIN 100 MG PO CAPS: 200.0000 mg | ORAL_CAPSULE | Freq: Every day | ORAL | 0 refills | Status: DC

## 2021-11-13 NOTE — Progress Notes (Signed)
[place] Phone: [phone] Subjective:   Deborah [name], am serving as a scribe for Dr. Hulan [name]. This visit occurred during the SARS-CoV-2 public health emergency.  Safety protocols were in place, including screening questions prior to the visit, additional usage of staff PPE, and extensive cleaning of exam room while observing appropriate contact time as indicated for disinfecting solutions.  I'm seeing this patient by the request  of:  Deborah Rail, MD  CC: Low back and hip pain follow-up  [id number]  10/16/2021 Significant degenerative disc disease noted.  Discussed icing regimen and home exercise, discussed which activities to do which wants to avoid.  Increase activity slowly.  Follow-up again in 4 to 8 weeks after patient responds to the prednisone and gabapentin.  May need advanced imaging if this continues.  Patient on x-rays today did have moderate to severe narrowing of the hip left greater than right.  I do believe that this is likely contributing to the majority of patient's pain.  Unfortunately I do believe that patient also has some underlying arthritic changes of the back that could be playing a role as well.  Prednisone and gabapentin given today.  Discussed home exercises and patient work with Product/process development scientist.  Worsening symptoms we may need to consider the possibility of advanced imaging versus injections.  Patient does have a history of DVT but does not appear to be this.  Patient does not feel like this is necessary for any further work-up.  Update 11/14/2021 Deborah [name] is a 76 y.o. female coming in with complaint of L hip and LBP. Patient states that hip is doing better. The hip is allowing her to bend and put on her socks. The back is more stiff in the mornings and feeling worse than the hip. Has not been using gabapentin. Did not feel as if it was doing anything. Did take  prednisone which helped. Has been limiting walking to 6k steps a day.   Xray L hip 10/16/2021 IMPRESSION: 1. 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 2. [name] acute bony abnormality.    DEXA 10/17/2021 Assessment: the BMD is low according to the [facility] classification for osteoporosis (see below). Fracture risk: moderate FRAX score: 10 year major osteoporotic risk: 9.6%. 10 year hip fracture risk: 1.5%. The thresholds for treatment are 20% and 3%, respectively. Comments: the technical quality of the study is good, however, L4 vertebra had to be excluded from analysis due to degenerative changes. Evaluation for secondary causes should be considered if clinically indicated.  Recommend optimizing calcium (1200 mg/day) and vitamin D (800 IU/day) intake.   Past Medical History:  Diagnosis Date   Clotting disorder ([place])    DVT   DVT (deep venous thrombosis) ([facility])    2-3X   GERD (gastroesophageal reflux disease)    Hyperlipidemia    Hypertension    Ocular migraine    Pneumonia    UTI (urinary tract infection) 10/10/13   E coli 70,000 colonies   Past Surgical History:  Procedure Laterality Date   DVT     2-3 X ; on BCP with first   ENDOVENOUS ABLATION SAPHENOUS VEIN W/ LASER  1517   complicated by DVT    ENDOVENOUS ABLATION SAPHENOUS VEIN W/ LASER  2013   Dr Renaldo Reel   [name] colonoscopy     [place] reviewed   TONSILLECTOMY     TUBAL LIGATION     G3 P3  WISDOM TOOTH EXTRACTION     Social History   Socioeconomic History   Marital status: Married    Spouse name: Not on file   Number of children: 3   Years of education: Not on file   Highest education level: Not on file  Occupational History   Occupation: retired Therapist, sports  Tobacco Use   Smoking status: Former    Types: Cigarettes    Quit date: 10/14/1969    Years since quitting: 52.1   Smokeless tobacco: Never   Tobacco comments:    Quit at age 88 (smoked x 2 years, up to 1 ppd)   Substance and Sexual Activity    Alcohol use: Yes    Alcohol/week: 2.0 standard drinks    Types: 2 Glasses of wine per week    Comment: Red wine (2 glasses weekly or less)    Drug use: [name]   Sexual activity: Not on file  Other Topics Concern   Not on file  Social History Narrative   Not on file   Social Determinants of Health   Financial Resource Strain: Low Risk    Difficulty of Paying Living Expenses: Not hard at all  Food Insecurity: [name] Food Insecurity   Worried About Charity fundraiser in the Last Year: Never true   Ran Out of Food in the Last Year: Never true  Transportation Needs: [name] Transportation Needs   Lack of Transportation (Medical): [name]   Lack of Transportation (Non-Medical): [name]  Physical Activity: Sufficiently Active   Days of Exercise per Week: 5 days   Minutes of Exercise per Session: 60 min  Stress: [name] Stress Concern Present   Feeling of Stress : Not at all  Social Connections: Socially Integrated   Frequency of Communication with Friends and Family: More than three times a week   Frequency of Social Gatherings with Friends and Family: More than three times a week   Attends Religious Services: More than 4 times per year   Active Member of [employer] or Organizations: Yes   Attends Music therapist: More than 4 times per year   Marital Status: Married   Allergies  Allergen Reactions   Statins     Muscle aches with some, elevated lft with crestor   Propoxyphene N-Acetaminophen     REACTION: GI Upset   Family History  Problem Relation Age of Onset   Arthritis Mother        OA   Melanoma Mother 40       in situ   Transient ischemic attack Mother        late 85s   COPD Mother    Cancer Father        renal cancer   Diabetes Father    Heart attack Father        in 68s   Hypertension Father    Asthma Father    Hypertension Sister    Asthma Sister    Kidney disease Sister        ? from Celebrex   Kidney failure Sister    Breast cancer Sister 23   Other Sister         Chron's or UC   Stroke Paternal Grandmother        in 87s   Diabetes Paternal Grandmother    Hypertension Paternal Grandmother    Emphysema Paternal Grandfather    Stroke Paternal Grandfather        in 67s   Breast cancer Cousin  early 20's   Diabetes Brother    Colon polyps Neg Hx    Esophageal cancer Neg Hx    Pancreatic cancer Neg Hx    Rectal cancer Neg Hx    Stomach cancer Neg Hx     Current Outpatient Medications (Endocrine & Metabolic):    predniSONE (DELTASONE) 20 MG tablet, Take 2 tablets (40 mg total) by mouth daily with breakfast.  Current Outpatient Medications (Cardiovascular):    hydrochlorothiazide (HYDRODIURIL) 25 MG tablet, Take 1 tablet (25 mg total) by mouth at bedtime. Please keep upcoming appt in February 2023 with Dr. Tamala Julian before anymore refills. Thank you Final Attempt   losartan (COZAAR) 100 MG tablet, Take 1 tablet (100 mg total) by mouth daily. Please keep upcoming appt in February 2023 with Dr. Tamala Julian before anymore refills. Thank you   Nebivolol HCl 20 MG TABS, TAKE 1 TABLET BY MOUTH  DAILY  Current Outpatient Medications (Respiratory):    albuterol (PROVENTIL HFA;VENTOLIN HFA) 108 (90 Base) MCG/ACT inhaler, Inhale 2 puffs into the lungs every 4 (four) hours as needed for wheezing.  Current Outpatient Medications (Analgesics):    aspirin EC 81 MG tablet, Take 81 mg by mouth daily. Swallow whole.   Current Outpatient Medications (Other):    Cholecalciferol (VITAMIN D3) 1000 units CAPS, Take 2,000 Units by mouth daily.    Cranberry-Vitamin C 15000-100 MG CAPS, Take by mouth.   gabapentin (NEURONTIN) 100 MG capsule, Take 2 capsules (200 mg total) by mouth at bedtime.   Multiple Vitamins-Minerals (CENTRUM SILVER ULTRA WOMENS PO), Take 1 tablet by mouth.   Reviewed prior external information including notes and imaging from  primary care provider As well as notes that were available from care everywhere and other healthcare systems.  Past  medical history, social, surgical and family history all reviewed in electronic medical record.  [name] pertanent information unless stated regarding to the chief complaint.   Review of Systems:  [name] headache, visual changes, nausea, vomiting, diarrhea, constipation, dizziness, abdominal pain, skin rash, fevers, chills, night sweats, weight loss, swollen lymph nodes, body aches, joint swelling, chest pain, shortness of breath, mood changes. POSITIVE muscle aches  Objective  Blood pressure 122/84, pulse 68, height 5\' 3"  (1.6 m), weight 195 lb (88.5 kg), SpO2 99 %.   General: [name] apparent distress alert and oriented x3 mood and affect normal, dressed appropriately.  HEENT: Pupils equal, extraocular movements intact  Respiratory: Patient's speak in full sentences and does not appear short of breath  Cardiovascular: [name] lower extremity edema, non tender, [name] erythema  Gait normal with good balance and coordination.  MSK: Patient's left hip does have some limited range of motion still noted.  Only has 5 degrees of internal rotation.  Patient does have tenderness to palpation over the paraspinal musculature of the lumbar spine on the left side.  Sitting comfortably currently noted chair today.    Impression and Recommendations:     The above documentation has been reviewed and is accurate and complete Lyndal Pulley, DO

## 2021-11-14 ENCOUNTER — Other Ambulatory Visit: Payer: Self-pay

## 2021-11-14 ENCOUNTER — Encounter: Payer: Self-pay | Admitting: Family Medicine

## 2021-11-14 ENCOUNTER — Ambulatory Visit: Payer: Medicare Other | Admitting: Family Medicine

## 2021-11-14 VITALS — BP 122/84 | HR 68 | Ht 63.0 in | Wt 195.0 lb

## 2021-11-14 DIAGNOSIS — M85852 Other specified disorders of bone density and structure, left thigh: Secondary | ICD-10-CM | POA: Diagnosis not present

## 2021-11-14 DIAGNOSIS — M2550 Pain in unspecified joint: Secondary | ICD-10-CM

## 2021-11-14 DIAGNOSIS — M85851 Other specified disorders of bone density and structure, right thigh: Secondary | ICD-10-CM | POA: Diagnosis not present

## 2021-11-14 DIAGNOSIS — M5136 Other intervertebral disc degeneration, lumbar region: Secondary | ICD-10-CM

## 2021-11-14 DIAGNOSIS — M25552 Pain in left hip: Secondary | ICD-10-CM | POA: Diagnosis not present

## 2021-11-14 LAB — IBC PANEL
Iron: 104 ug/dL (ref 42–145)
Saturation Ratios: 31.9 % (ref 20.0–50.0)
TIBC: 326.2 ug/dL (ref 250.0–450.0)
Transferrin: 233 mg/dL (ref 212.0–360.0)

## 2021-11-14 LAB — TSH: TSH: 2.85 u[IU]/mL (ref 0.35–5.50)

## 2021-11-14 LAB — SEDIMENTATION RATE: Sed Rate: 35 mm/hr — ABNORMAL HIGH (ref 0–30)

## 2021-11-14 LAB — COMPREHENSIVE METABOLIC PANEL
ALT: 16 U/L (ref 0–35)
AST: 17 U/L (ref 0–37)
Albumin: 4.5 g/dL (ref 3.5–5.2)
Alkaline Phosphatase: 55 U/L (ref 39–117)
BUN: 19 mg/dL (ref 6–23)
CO2: 30 mEq/L (ref 19–32)
Calcium: 9.9 mg/dL (ref 8.4–10.5)
Chloride: 101 mEq/L (ref 96–112)
Creatinine, Ser: 1.03 mg/dL (ref 0.40–1.20)
GFR: 53.55 mL/min — ABNORMAL LOW (ref >60.00)
Glucose, Bld: 100 mg/dL — ABNORMAL HIGH (ref 70–99)
Potassium: 3.7 mEq/L (ref 3.5–5.1)
Sodium: 139 mEq/L (ref 135–145)
Total Bilirubin: 0.7 mg/dL (ref 0.2–1.2)
Total Protein: 7.5 g/dL (ref 6.0–8.3)

## 2021-11-14 LAB — FERRITIN: Ferritin: 138 ng/mL (ref 10.0–291.0)

## 2021-11-14 LAB — VITAMIN B12: Vitamin B-12: 274 pg/mL (ref 211–911)

## 2021-11-14 LAB — URIC ACID: Uric Acid, Serum: 6.7 mg/dL (ref 2.4–7.0)

## 2021-11-14 LAB — VITAMIN D 25 HYDROXY (VIT D DEFICIENCY, FRACTURES): VITD: 56.83 ng/mL (ref 30.00–100.00)

## 2021-11-14 NOTE — Assessment & Plan Note (Signed)
Patient did have some improvement in the osteopenia but does have the hypercalcemia and we will get further laboratory work-up to see if anything else is contributing.

## 2021-11-14 NOTE — Patient Instructions (Addendum)
Labs today Stay active Add vibration plate See me in 2 months

## 2021-11-14 NOTE — Assessment & Plan Note (Signed)
Known arthritic changes.  Is relatively doing well.  Discussed with patient about different treatment options.  Patient wants to avoid any type of surgical intervention at this time.  We will consider therapy in the future potentially for this.  Patient will not do any significant surgeries on the back.  Hopefully patient will be well with conservative therapy otherwise.  Patient is on gabapentin and this could be beneficial.  Total time reviewing patient's imaging, bone density and discussing with patient today 31 minutes.

## 2021-11-14 NOTE — Assessment & Plan Note (Signed)
Chronic and stable at the moment.  Can get advanced imaging if any worsening pain but did have improvement today.

## 2021-11-15 ENCOUNTER — Ambulatory Visit: Payer: Medicare Other | Admitting: Interventional Cardiology

## 2021-11-16 LAB — PTH, INTACT AND CALCIUM
Calcium: 9.5 mg/dL (ref 8.6–10.4)
PTH: 52 pg/mL (ref 16–77)

## 2021-12-15 ENCOUNTER — Other Ambulatory Visit: Payer: Self-pay | Admitting: Interventional Cardiology

## 2021-12-27 ENCOUNTER — Other Ambulatory Visit: Payer: Self-pay | Admitting: Interventional Cardiology

## 2022-01-16 ENCOUNTER — Ambulatory Visit: Payer: Medicare Other | Admitting: Interventional Cardiology

## 2022-01-22 NOTE — Progress Notes (Signed)
?Deborah [name] D.O. ?[facility] ?[place] ?Phone: [phone] ?Subjective:   ?I, Deborah [name], am serving as a scribe for Dr. Hulan Saas. ? ?This visit occurred during the SARS-CoV-2 public health emergency.  Safety protocols were in place, including screening questions prior to the visit, additional usage of staff PPE, and extensive cleaning of exam room while observing appropriate contact time as indicated for disinfecting solutions.  ?I'm seeing this patient by the request  of:  Binnie Rail, MD ? ?CC: Low back and hip pain follow-up ? ?[id number]  ?11/14/2021 ?Chronic and stable at the moment.  Can get advanced imaging if any worsening pain but did have improvement today. ? ?Patient did have some improvement in the osteopenia but does have the hypercalcemia and we will get further laboratory work-up to see if anything else is contributing ? ?Update 01/28/2022 ?Deborah [name] is a 76 y.o. female coming in with complaint of lumbar and L hip pain. Labwork last visit.  Lab work showed that patient's kidneys seem to be relatively normal or at her baseline but patient's sedimentation rate was elevated at 35 with the rest of the labs unremarkable.  Calcium was back to baseline.  Patient states that she has pain in hip with sit to stand after sitting on soft surface and walking up hills. Patient is not walking as much as she use to due to shorter walks. No longer taking gabapentin.  ? ?  ? ?Past Medical History:  ?Diagnosis Date  ? Clotting disorder ([place])   ? DVT  ? DVT (deep venous thrombosis) ([place])   ? 2-3X  ? GERD (gastroesophageal reflux disease)   ? Hyperlipidemia   ? Hypertension   ? Ocular migraine   ? Pneumonia   ? UTI (urinary tract infection) 10/10/13  ? E coli 70,000 colonies  ? ?Past Surgical History:  ?Procedure Laterality Date  ? DVT    ? 2-3 X ; on BCP with first  ? ENDOVENOUS ABLATION SAPHENOUS VEIN W/ LASER  2003  ? complicated by DVT   ?  ENDOVENOUS ABLATION SAPHENOUS VEIN W/ LASER  2013  ? Dr Renaldo Reel  ? no colonoscopy    ? SOC reviewed  ? TONSILLECTOMY    ? TUBAL LIGATION    ? G3 P3  ? WISDOM TOOTH EXTRACTION    ? ?Social History  ? ?Socioeconomic History  ? Marital status: Married  ?  Spouse name: Not on file  ? Number of children: 3  ? Years of education: Not on file  ? Highest education level: Not on file  ?Occupational History  ? Occupation: retired Therapist, sports  ?Tobacco Use  ? Smoking status: Former  ?  Types: Cigarettes  ?  Quit date: 10/14/1969  ?  Years since quitting: 52.3  ? Smokeless tobacco: Never  ? Tobacco comments:  ?  Quit at age 46 (smoked x 2 years, up to 1 ppd)   ?Substance and Sexual Activity  ? Alcohol use: Yes  ?  Alcohol/week: 2.0 standard drinks  ?  Types: 2 Glasses of wine per week  ?  Comment: Red wine (2 glasses weekly or less)   ? Drug use: No  ? Sexual activity: Not on file  ?Other Topics Concern  ? Not on file  ?Social History Narrative  ? Not on file  ? ?Social Determinants of Health  ? ?Financial Resource Strain: Low Risk   ? Difficulty of Paying Living Expenses: Not hard at all  ?  Food Insecurity: No Food Insecurity  ? Worried About Charity fundraiser in the Last Year: Never true  ? Ran Out of Food in the Last Year: Never true  ?Transportation Needs: No Transportation Needs  ? Lack of Transportation (Medical): No  ? Lack of Transportation (Non-Medical): No  ?Physical Activity: Sufficiently Active  ? Days of Exercise per Week: 5 days  ? Minutes of Exercise per Session: 60 min  ?Stress: No Stress Concern Present  ? Feeling of Stress : Not at all  ?Social Connections: Socially Integrated  ? Frequency of Communication with Friends and Family: More than three times a week  ? Frequency of Social Gatherings with Friends and Family: More than three times a week  ? Attends Religious Services: More than 4 times per year  ? Active Member of Clubs or Organizations: Yes  ? Attends Archivist Meetings: More than 4 times per  year  ? Marital Status: Married  ? ?Allergies  ?Allergen Reactions  ? Statins   ?  Muscle aches with some, elevated lft with crestor  ? Propoxyphene N-Acetaminophen   ?  REACTION: GI Upset  ? ?Family History  ?Problem Relation Age of Onset  ? Arthritis Mother   ?     OA  ? Melanoma Mother 22  ?     in situ  ? Transient ischemic attack Mother   ?     late 51s  ? COPD Mother   ? Cancer Father   ?     renal cancer  ? Diabetes Father   ? Heart attack Father   ?     in 29s  ? Hypertension Father   ? Asthma Father   ? Hypertension Sister   ? Asthma Sister   ? Kidney disease Sister   ?     ? from Celebrex  ? Kidney failure Sister   ? Breast cancer Sister 62  ? Other Sister   ?     Chron's or UC  ? Stroke Paternal Grandmother   ?     in 32s  ? Diabetes Paternal Grandmother   ? Hypertension Paternal Grandmother   ? Emphysema Paternal Grandfather   ? Stroke Paternal Grandfather   ?     in 3s  ? Breast cancer Cousin   ?     early 75's  ? Diabetes Brother   ? Colon polyps Neg Hx   ? Esophageal cancer Neg Hx   ? Pancreatic cancer Neg Hx   ? Rectal cancer Neg Hx   ? Stomach cancer Neg Hx   ? ? ? ?Current Outpatient Medications (Cardiovascular):  ?  hydrochlorothiazide (HYDRODIURIL) 25 MG tablet, Take 1 tablet (25 mg total) by mouth at bedtime. Please keep upcoming appt in April 2023 with Dr. Tamala Julian before anymore refills. Thank you Final Attempt ?  losartan (COZAAR) 100 MG tablet, Take 0.5 tablets (50 mg total) by mouth daily. Please keep upcoming appt in April 2023 with Dr. Tamala Julian before anymore refills. Thank you Final Attempt ?  Nebivolol HCl 20 MG TABS, TAKE 1 TABLET BY MOUTH  DAILY ? ?Current Outpatient Medications (Respiratory):  ?  albuterol (PROVENTIL HFA;VENTOLIN HFA) 108 (90 Base) MCG/ACT inhaler, Inhale 2 puffs into the lungs every 4 (four) hours as needed for wheezing. ? ?Current Outpatient Medications (Analgesics):  ?  aspirin EC 81 MG tablet, Take 81 mg by mouth daily. Swallow whole. ? ? ?Current Outpatient  Medications (Other):  ?  Cholecalciferol (VITAMIN D3) 1000  units CAPS, Take 2,000 Units by mouth daily.  ?  Multiple Vitamins-Minerals (CENTRUM SILVER ULTRA WOMENS PO), Take 1 tablet by mouth. ?  Cranberry-Vitamin C 15000-100 MG CAPS, Take by mouth. ? ? ? ?Review of Systems: ? No headache, visual changes, nausea, vomiting, diarrhea, constipation, dizziness, abdominal pain, skin rash, fevers, chills, night sweats, weight loss, swollen lymph nodes, body aches, joint swelling, chest pain, shortness of breath, mood changes. POSITIVE muscle aches ? ?Objective  ?Blood pressure 140/72, pulse 69, height '5\' 3"'$  (1.6 m), weight 196 lb (88.9 kg), SpO2 98 %. ?  ?General: No apparent distress alert and oriented x3 mood and affect normal, dressed appropriately.  ?HEENT: Pupils equal, extraocular movements intact  ?Respiratory: Patient's speak in full sentences and does not appear short of breath  ?Cardiovascular: No lower extremity edema, non tender, no erythema  ?Gait normal with good balance and coordination.  ?MSK: Patient's low back does have some loss of lordosis.  Tightness noted in all extremities.  Patient does have a mild tightness with FABER test and very minimal tenderness over the lateral aspect of the hip.  Does have some limited internal rotation of the left hip tightness with Corky Sox ? ?  ?Impression and Recommendations:  ?  ? ?The above documentation has been reviewed and is accurate and complete Lyndal Pulley, DO ? ? ? ?

## 2022-01-23 ENCOUNTER — Encounter: Payer: Self-pay | Admitting: Family Medicine

## 2022-01-23 ENCOUNTER — Ambulatory Visit: Payer: Medicare Other | Admitting: Family Medicine

## 2022-01-23 VITALS — BP 140/72 | HR 69 | Ht 63.0 in | Wt 196.0 lb

## 2022-01-23 DIAGNOSIS — M5136 Other intervertebral disc degeneration, lumbar region: Secondary | ICD-10-CM

## 2022-01-23 DIAGNOSIS — M25552 Pain in left hip: Secondary | ICD-10-CM

## 2022-01-23 NOTE — Patient Instructions (Signed)
Great to see you as always  ?To many pet names ;) ?Stay active ?Try a little more strength  ?See me again in 3-4 months  ?

## 2022-01-23 NOTE — Assessment & Plan Note (Signed)
Patient does have moderate arthritic changes but pain seems to be more over the lateral aspect of the hip.  We discussed with patient that it could be secondary to this or the possibility of some of the degenerative disc disease of the lumbar spine.  Patient is doing well and not feeling like having any significant worsening.  Will increase activity as tolerated.  Patient will continue to stay active.  We will follow-up again in 3 to 4 months. ?

## 2022-02-10 ENCOUNTER — Other Ambulatory Visit: Payer: Self-pay | Admitting: Interventional Cardiology

## 2022-02-15 ENCOUNTER — Other Ambulatory Visit (INDEPENDENT_AMBULATORY_CARE_PROVIDER_SITE_OTHER): Payer: Medicare Other

## 2022-02-15 DIAGNOSIS — E782 Mixed hyperlipidemia: Secondary | ICD-10-CM

## 2022-02-15 LAB — LIPID PANEL
Cholesterol: 189 mg/dL (ref 0–200)
HDL: 54.8 mg/dL (ref >39.00)
LDL Cholesterol: 111 mg/dL — ABNORMAL HIGH (ref 0–99)
NonHDL: 134
Total CHOL/HDL Ratio: 3
Triglycerides: 115 mg/dL (ref 0.0–149.0)
VLDL: 23 mg/dL (ref 0.0–40.0)

## 2022-03-12 ENCOUNTER — Ambulatory Visit (INDEPENDENT_AMBULATORY_CARE_PROVIDER_SITE_OTHER): Payer: Medicare Other

## 2022-03-12 VITALS — Ht 63.0 in | Wt 177.0 lb

## 2022-03-12 DIAGNOSIS — Z0000 Encounter for general adult medical examination without abnormal findings: Secondary | ICD-10-CM

## 2022-03-12 NOTE — Progress Notes (Signed)
I connected with Deborah [name] today by telephone and verified that I am speaking with the correct person using two identifiers. Location patient: home Location provider: work Persons participating in the virtual visit: patient, provider.   I discussed the limitations, risks, security and privacy concerns of performing an evaluation and management service by telephone and the availability of in person appointments. I also discussed with the patient that there may be a patient responsible charge related to this service. The patient expressed understanding and verbally consented to this telephonic visit.    Interactive audio and video telecommunications were attempted between this provider and patient, however failed, due to patient having technical difficulties OR patient did not have access to video capability.  We continued and completed visit with audio only.  Some vital signs may be absent or patient reported.   Time Spent with patient on telephone encounter: 30 minutes  Subjective:   Deborah [name] is a 76 y.o. female who presents for Medicare Annual (Subsequent) preventive examination.  Review of Systems     Cardiac Risk Factors include: advanced age (>19mn, >>60women);hypertension;family history of premature cardiovascular disease;dyslipidemia;obesity (BMI >30kg/m2)     Objective:    Today's Vitals   03/12/22 1003  Weight: 177 lb (80.3 kg)  Height: '5\' 3"'$  (1.6 m)  PainSc: 0-No pain   Body mass index is 31.35 kg/m.     03/12/2022    9:50 AM 03/09/2021    2:12 PM 01/27/2020    1:41 PM 09/07/2017   11:20 AM  Advanced Directives  Does Patient Have a Medical Advance Directive? Yes Yes Yes No  Type of Advance Directive Living will;Healthcare Power of Attorney Living will;[place]care [place]   Does patient want to make changes to medical advance directive? No - Patient declined No - Patient declined No -  Patient declined   Copy of [place]? No - copy requested No - copy requested No - copy requested   Would patient like information on creating a medical advance directive?    No - Patient declined    Current Medications (verified) Outpatient Encounter Medications as of 03/12/2022  Medication Sig   albuterol (PROVENTIL HFA;VENTOLIN HFA) 108 (90 Base) MCG/ACT inhaler Inhale 2 puffs into the lungs every 4 (four) hours as needed for wheezing.   aspirin EC 81 MG tablet Take 81 mg by mouth daily. Swallow whole.   Cholecalciferol (VITAMIN D3) 1000 units CAPS Take 2,000 Units by mouth daily.    hydrochlorothiazide (HYDRODIURIL) 25 MG tablet Take 1 tablet (25 mg total) by mouth at bedtime. Please keep upcoming appt in April 2023 with Dr. STamala Julianbefore anymore refills. Thank you Final Attempt   losartan (COZAAR) 100 MG tablet Take 0.5 tablets (50 mg total) by mouth daily. Please keep upcoming appt in July 2023 with Dr. STamala Julianbefore anymore refills. Thank you Final Attempt   Multiple Vitamins-Minerals (CENTRUM SILVER ULTRA WOMENS PO) Take 1 tablet by mouth.   Nebivolol HCl 20 MG TABS TAKE 1 TABLET BY MOUTH  DAILY   [DISCONTINUED] Cranberry-Vitamin C 15000-100 MG CAPS Take by mouth.   No facility-administered encounter medications on file as of 03/12/2022.    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   GERD (gastroesophageal reflux disease)    Hyperlipidemia  Hypertension    Ocular migraine    Pneumonia    UTI (urinary tract infection) 10/10/13   E coli 70,000 colonies   Past Surgical History:  Procedure Laterality Date   DVT     2-3 X ; on BCP with first   ENDOVENOUS ABLATION SAPHENOUS VEIN W/ LASER  2409   complicated by DVT    ENDOVENOUS ABLATION SAPHENOUS VEIN W/ LASER  2013   Dr Renaldo Reel   no colonoscopy      [place] reviewed   TONSILLECTOMY     TUBAL LIGATION     G3 P3   WISDOM TOOTH EXTRACTION     Family History  Problem Relation Age of Onset   Arthritis Mother        OA   Melanoma Mother 64       in situ   Transient ischemic attack Mother        late 64s   COPD Mother    Cancer Father        renal cancer   Diabetes Father    Heart attack Father        in 23s   Hypertension Father    Asthma Father    Hypertension Sister    Asthma Sister    Kidney disease Sister        ? from Celebrex   Kidney failure Sister    Breast cancer Sister 61   Other Sister        Chron's or UC   Stroke Paternal Grandmother        in 105s   Diabetes Paternal Grandmother    Hypertension Paternal Grandmother    Emphysema Paternal Grandfather    Stroke Paternal Grandfather        in 34s   Breast cancer Cousin        early 83's   Diabetes Brother    Colon polyps Neg Hx    Esophageal cancer Neg Hx    Pancreatic cancer Neg Hx    Rectal cancer Neg Hx    Stomach cancer Neg Hx    Social History   Socioeconomic History   Marital status: Married    Spouse name: Not on file   Number of children: 3   Years of education: Not on file   Highest education level: Not on file  Occupational History   Occupation: retired Therapist, sports  Tobacco Use   Smoking status: Former    Types: Cigarettes    Quit date: 10/14/1969    Years since quitting: 52.4   Smokeless tobacco: Never   Tobacco comments:    Quit at age 58 (smoked x 2 years, up to 1 ppd)   Substance and Sexual Activity   Alcohol use: Yes    Alcohol/week: 2.0 standard drinks    Types: 2 Glasses of wine per week    Comment: Red wine (2 glasses weekly or less)    Drug use: No   Sexual activity: Not on file  Other Topics Concern   Not on file  Social History Narrative   Not on file   Social Determinants of Health   Financial Resource Strain: Low Risk    Difficulty of Paying Living Expenses: Not hard at all  Food Insecurity: No Food Insecurity    Worried About Charity fundraiser in the Last Year: Never true   [place] in the Last Year: Never true  Transportation Needs: No Transportation Needs   Lack of Transportation (Medical): No  Lack of Transportation (Non-Medical): No  Physical Activity: Sufficiently Active   Days of Exercise per Week: 5 days   Minutes of Exercise per Session: 30 min  Stress: No Stress Concern Present   Feeling of Stress : Not at all  Social Connections: Socially Integrated   Frequency of Communication with Friends and Family: More than three times a week   Frequency of Social Gatherings with Friends and Family: More than three times a week   Attends Religious Services: More than 4 times per year   Active Member of [employer] or Organizations: Yes   Attends Music therapist: More than 4 times per year   Marital Status: Married    Tobacco Counseling Counseling given: Not Answered Tobacco comments: Quit at age 50 (smoked x 2 years, up to 1 ppd)    Clinical Intake:  Pre-visit preparation completed: Yes  Pain : No/denies pain Pain Score: 0-No pain     BMI - recorded: 34.73 Nutritional Status: BMI > 30  Obese Nutritional Risks: None Diabetes: No  How often do you need to have someone help you when you read instructions, pamphlets, or other written materials from your doctor or pharmacy?: 1 - Never What is the last grade level you completed in school?: 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 no  Interpreter Needed?: No  Information entered by :: [employer], LPN.   Activities of Daily Living    03/12/2022    9:56 AM  In your present state of health, do you have any difficulty performing the following activities:  Hearing? 0  Vision? 0  Difficulty concentrating or making decisions? 0  Walking or climbing stairs? 0  Dressing or bathing? 0  Doing errands, shopping? 0  Preparing Food and eating ? N  Using the Toilet? N  In the past six months, have you  accidently leaked urine? N  Do you have problems with loss of bowel control? N  Managing your Medications? N  Managing your Finances? N  Housekeeping or managing your Housekeeping? N    Patient Care Team: Binnie Rail, MD as PCP - General (Internal Medicine) Belva Crome, MD as PCP - Cardiology (Cardiology)  Indicate any recent Medical Services you may have received from other than [facility] providers in the past year (date may be approximate).     Assessment:   This is a routine wellness examination for Deborah [name].  Hearing/Vision screen Hearing Screening - Comments:: Patient denied any hearing difficulty.   No hearing aids.  Vision Screening - Comments:: Patient does wear corrective lenses/contacts.  Eye exam done by: Deborah Apo, MD.   Dietary issues and exercise activities discussed: Current Exercise Habits: Home exercise routine, Type of exercise: walking;Other - see comments (gardening), Time (Minutes): 30, Frequency (Times/Week): 5, Weekly Exercise (Minutes/Week): 150, Intensity: Moderate, Exercise limited by: orthopedic condition(s)   Goals Addressed             This Visit's Progress    My goal is to continue losing weight.  My weight goal is to be 150-160 pounds.        Depression Screen    03/12/2022    9:56 AM 03/09/2021    2:11 PM 01/27/2020    1:43 PM 01/24/2016   11:16 AM 10/25/2013    2:38 PM  PHQ 2/9 Scores  PHQ - 2 Score 0 0 0 0 0    Fall Risk    03/12/2022    9:51 AM 03/09/2021    2:12  PM 01/27/2020    1:42 PM 03/10/2018   11:33 AM 03/10/2018   10:42 AM  Fall Risk   Falls in the past year? 0 0 0 No No  Number falls in past yr: 0 0 0    Injury with Fall? 0 0 0    Comment   twisted her ankle    Risk for fall due to : No Fall Risks No Fall Risks No Fall Risks    Follow up Falls evaluation completed Falls evaluation completed Falls evaluation completed;Education provided      FALL RISK PREVENTION PERTAINING TO THE HOME:  Any stairs in or around  the home? Yes  If so, are there any without handrails? No  Home free of loose throw rugs in walkways, pet beds, electrical cords, etc? Yes  Adequate lighting in your home to reduce risk of falls? Yes   ASSISTIVE DEVICES UTILIZED TO PREVENT FALLS:  Life alert? No  Use of a cane, walker or w/c? No  Grab bars in the bathroom? Yes  Shower chair or bench in shower? Yes  Elevated toilet seat or a handicapped toilet? Yes   TIMED UP AND GO:  Was the test performed? No .  Length of time to ambulate 10 feet: n/a sec.   Appearance of gait: Patient not evaluated for gait during this visit.  Cognitive Function:        03/12/2022   10:06 AM 01/27/2020    1:51 PM  6CIT Screen  What Year? 0 points 0 points  What month? 0 points 0 points  What time? 0 points 0 points  Count back from 20 0 points 0 points  Months in reverse 0 points 0 points  Repeat phrase 0 points 0 points  Total Score 0 points 0 points    Immunizations Immunization History  Administered Date(s) Administered   Fluad Quad(high Dose 65+) 06/21/2021   Influenza Split 07/29/2012   Influenza Whole 07/19/2008   Influenza, High Dose Seasonal PF 06/26/2017, 08/02/2018, 06/26/2019, 06/09/2020   Influenza,inj,Quad PF,6+ Mos 08/10/2013   Influenza-Unspecified 07/31/2014, 08/11/2015, 06/09/2020   Moderna SARS-COV2 Booster Vaccination 08/04/2020   Moderna Sars-Covid-2 Vaccination 11/15/2019, 12/24/2019   PFIZER Comirnaty(Gray Top)Covid-19 Tri-Sucrose Vaccine 03/26/2021   Pneumococcal Conjugate-13 10/20/2015   Pneumococcal Polysaccharide-23 10/25/2013   Tdap 04/02/2011    TDAP status: Up to date  Flu Vaccine status: Up to date  Pneumococcal vaccine status: Up to date  Covid-19 vaccine status: Completed vaccines  Qualifies for Shingles Vaccine? Yes   Zostavax completed No   Shingrix Completed?: No.    Education has been provided regarding the importance of this vaccine. Patient has been advised to call insurance company  to determine out of pocket expense if they have not yet received this vaccine. Advised may also receive vaccine at local pharmacy or Health Dept. Verbalized acceptance and understanding.  Screening Tests Health Maintenance  Topic Date Due   Zoster Vaccines- Shingrix (1 of 2) Never done   COVID-19 Vaccine (4 - Booster for Moderna series) 05/21/2021   INFLUENZA VACCINE  05/14/2022   MAMMOGRAM  06/20/2023   DEXA SCAN  10/17/2024   COLONOSCOPY (Pts 45-61yr Insurance coverage will need to be confirmed)  03/05/2026   TETANUS/TDAP  11/28/2031   Pneumonia Vaccine 75 Years old  Completed   Hepatitis C Screening  Completed   HPV VACCINES  Aged Out    Health Maintenance  Health Maintenance Due  Topic Date Due   Zoster Vaccines- Shingrix (1 of 2) Never done  COVID-19 Vaccine (4 - Booster for Moderna series) 05/21/2021    Colorectal cancer screening: Type of screening: Colonoscopy. Completed 03/05/2016. Repeat every 10 years  Mammogram status: Completed 06/19/2021. Repeat every year  Bone Density status: Completed 10/17/2021. Results reflect: Bone density results: OSTEOPENIA. Repeat every 2-3 years.  Lung Cancer Screening: (Low Dose CT Chest recommended if Age 28-80 years, 30 pack-year currently smoking OR have quit w/in 15years.) does not qualify.   Lung Cancer Screening Referral: no  Additional Screening:  Hepatitis C Screening: does qualify; Completed 11/20/2016  Vision Screening: Recommended annual ophthalmology exams for early detection of glaucoma and other disorders of the eye. Is the patient up to date with their annual eye exam?  Yes  Who is the provider or what is the name of the office in which the patient attends annual eye exams? Deborah Apo, MD. If pt is not established with a provider, would they like to be referred to a provider to establish care? No .   Dental Screening: Recommended annual dental exams for proper oral hygiene  Community Resource Referral / Chronic Care  Management: CRR required this visit?  No   CCM required this visit?  No      Plan:     I have personally reviewed and noted the following in the patient's chart:   Medical and social history Use of alcohol, tobacco or illicit drugs  Current medications and supplements including opioid prescriptions.  Functional ability and status Nutritional status Physical activity Advanced directives List of other physicians Hospitalizations, surgeries, and ER visits in previous 12 months Vitals Screenings to include cognitive, depression, and falls Referrals and appointments  In addition, I have reviewed and discussed with patient certain preventive protocols, quality metrics, and best practice recommendations. A written personalized care plan for preventive services as well as general preventive health recommendations were provided to patient.     Sheral Flow, LPN   0/25/4270   Nurse Notes:  There were no vitals filed for this visit. There is no height or weight on file to calculate BMI. Patient stated that she has no issues with gait or balance; does not use any assistive devices. Medications reviewed with patient; no opioid use noted.

## 2022-03-12 NOTE — Patient Instructions (Signed)
Deborah [name] , Thank you for taking time to come for your Medicare Wellness Visit. I appreciate your ongoing commitment to your health goals. Please review the following plan we discussed and let me know if I can assist you in the future.   Screening recommendations/referrals: Colonoscopy: 03/05/2016; due every 10 years Mammogram: 06/19/2021; due every year Bone Density: 10/17/2021; due every 2-3 years Recommended yearly ophthalmology/optometry visit for glaucoma screening and checkup Recommended yearly dental visit for hygiene and checkup  Vaccinations: Influenza vaccine: 06/21/2021 Pneumococcal vaccine: 10/25/2013, 10/20/2015 Tdap vaccine: 11/27/2021; due every 10 years Shingles vaccine: never done   Covid-19: 11/15/2019, 12/24/2019, 08/04/2020, 03/26/2021, 07/09/2021  Advanced directives: Yes  Conditions/risks identified: Yes  Next appointment: Please schedule your next Medicare Wellness Visit with your Nurse Health Advisor in 1 year by calling [phone].   Preventive Care 55 Years and Older, Female Preventive care refers to lifestyle choices and visits with your health care provider that can promote health and wellness. What does preventive care include? A yearly physical exam. This is also called an annual well check. Dental exams once or twice a year. Routine eye exams. Ask your health care provider how often you should have your eyes checked. Personal lifestyle choices, including: Daily care of your teeth and gums. Regular physical activity. Eating a healthy diet. Avoiding tobacco and drug use. Limiting alcohol use. Practicing safe sex. Taking low-dose aspirin every day. Taking vitamin and mineral supplements as recommended by your health care provider. What happens during an annual well check? The services and screenings done by your health care provider during your annual well check will depend on your age, overall health, lifestyle risk factors, and family history of  disease. Counseling  Your health care provider may ask you questions about your: Alcohol use. Tobacco use. Drug use. Emotional well-being. Home and relationship well-being. Sexual activity. Eating habits. History of falls. Memory and ability to understand (cognition). Work and work Statistician. Reproductive health. Screening  You may have the following tests or measurements: Height, weight, and BMI. Blood pressure. Lipid and cholesterol levels. These may be checked every 5 years, or more frequently if you are over 42 years old. Skin check. Lung cancer screening. You may have this screening every year starting at age 62 if you have a 30-pack-year history of smoking and currently smoke or have quit within the past 15 years. Fecal occult blood test (FOBT) of the stool. You may have this test every year starting at age 74. Flexible sigmoidoscopy or colonoscopy. You may have a sigmoidoscopy every 5 years or a colonoscopy every 10 years starting at age 51. Hepatitis C blood test. Hepatitis B blood test. Sexually transmitted disease (STD) testing. Diabetes screening. This is done by checking your blood sugar (glucose) after you have not eaten for a while (fasting). You may have this done every 1-3 years. Bone density scan. This is done to screen for osteoporosis. You may have this done starting at age 4. Mammogram. This may be done every 1-2 years. Talk to your health care provider about how often you should have regular mammograms. Talk with your health care provider about your test results, treatment options, and if necessary, the need for more tests. Vaccines  Your health care provider may recommend certain vaccines, such as: Influenza vaccine. This is recommended every year. Tetanus, diphtheria, and acellular pertussis (Tdap, Td) vaccine. You may need a Td booster every 10 years. Zoster vaccine. You may need this after age 48. Pneumococcal 13-valent conjugate (PCV13) vaccine. One  dose is recommended after age 35. Pneumococcal polysaccharide (PPSV23) vaccine. One dose is recommended after age 4. Talk to your health care provider about which screenings and vaccines you need and how often you need them. This information is not intended to replace advice given to you by your health care provider. Make sure you discuss any questions you have with your health care provider. Document Released: 10/27/2015 Document Revised: 06/19/2016 Document Reviewed: 08/01/2015 Elsevier Interactive Patient Education  2017 [place] Prevention in the Home Falls can cause injuries. They can happen to people of all ages. There are many things you can do to make your home safe and to help prevent falls. What can I do on the outside of my home? Regularly fix the edges of walkways and driveways and fix any cracks. Remove anything that might make you trip as you walk through a door, such as a raised step or threshold. Trim any bushes or trees on the path to your home. Use bright outdoor lighting. Clear any walking paths of anything that might make someone trip, such as rocks or tools. Regularly check to see if handrails are loose or broken. Make sure that both sides of any steps have handrails. Any raised decks and porches should have guardrails on the edges. Have any leaves, snow, or ice cleared regularly. Use sand or salt on walking paths during winter. Clean up any spills in your garage right away. This includes oil or grease spills. What can I do in the bathroom? Use night lights. Install grab bars by the toilet and in the tub and shower. Do not use towel bars as grab bars. Use non-skid mats or decals in the tub or shower. If you need to sit down in the shower, use a plastic, non-slip stool. Keep the floor dry. Clean up any water that spills on the floor as soon as it happens. Remove soap buildup in the tub or shower regularly. Attach bath mats securely with double-sided  non-slip rug tape. Do not have throw rugs and other things on the floor that can make you trip. What can I do in the bedroom? Use night lights. Make sure that you have a light by your bed that is easy to reach. Do not use any sheets or blankets that are too big for your bed. They should not hang down onto the floor. Have a firm chair that has side arms. You can use this for support while you get dressed. Do not have throw rugs and other things on the floor that can make you trip. What can I do in the kitchen? Clean up any spills right away. Avoid walking on wet floors. Keep items that you use a lot in easy-to-reach places. If you need to reach something above you, use a strong step stool that has a grab bar. Keep electrical cords out of the way. Do not use floor polish or wax that makes floors slippery. If you must use wax, use non-skid floor wax. Do not have throw rugs and other things on the floor that can make you trip. What can I do with my stairs? Do not leave any items on the stairs. Make sure that there are handrails on both sides of the stairs and use them. Fix handrails that are broken or loose. Make sure that handrails are as long as the stairways. Check any carpeting to make sure that it is firmly attached to the stairs. Fix any carpet that is loose or worn. Avoid  having throw rugs at the top or bottom of the stairs. If you do have throw rugs, attach them to the floor with carpet tape. Make sure that you have a light switch at the top of the stairs and the bottom of the stairs. If you do not have them, ask someone to add them for you. What else can I do to help prevent falls? Wear shoes that: Do not have high heels. Have rubber bottoms. Are comfortable and fit you well. Are closed at the toe. Do not wear sandals. If you use a stepladder: Make sure that it is fully opened. Do not climb a closed stepladder. Make sure that both sides of the stepladder are locked into place. Ask  someone to hold it for you, if possible. Clearly mark and make sure that you can see: Any grab bars or handrails. First and last steps. Where the edge of each step is. Use tools that help you move around (mobility aids) if they are needed. These include: Canes. Walkers. Scooters. Crutches. Turn on the lights when you go into a dark area. Replace any light bulbs as soon as they burn out. Set up your furniture so you have a clear path. Avoid moving your furniture around. If any of your floors are uneven, fix them. If there are any pets around you, be aware of where they are. Review your medicines with your doctor. Some medicines can make you feel dizzy. This can increase your chance of falling. Ask your doctor what other things that you can do to help prevent falls. This information is not intended to replace advice given to you by your health care provider. Make sure you discuss any questions you have with your health care provider. Document Released: 07/27/2009 Document Revised: 03/07/2016 Document Reviewed: 11/04/2014 Elsevier Interactive Patient Education  2017 [employer].

## 2022-03-24 ENCOUNTER — Other Ambulatory Visit: Payer: Self-pay | Admitting: Interventional Cardiology

## 2022-04-03 DIAGNOSIS — H40013 Open angle with borderline findings, low risk, bilateral: Secondary | ICD-10-CM | POA: Diagnosis not present

## 2022-04-03 DIAGNOSIS — H524 Presbyopia: Secondary | ICD-10-CM | POA: Diagnosis not present

## 2022-04-03 DIAGNOSIS — H52203 Unspecified astigmatism, bilateral: Secondary | ICD-10-CM | POA: Diagnosis not present

## 2022-04-03 DIAGNOSIS — H2513 Age-related nuclear cataract, bilateral: Secondary | ICD-10-CM | POA: Diagnosis not present

## 2022-04-16 NOTE — Progress Notes (Signed)
Cardiology Office Note:    Date:  04/17/2022   ID:  Deborah [name], DOB 03-05-1946, MRN [id number]  PCP:  Binnie Rail, MD  Cardiologist:  Sinclair Grooms, MD   Referring MD: Binnie Rail, MD   No chief complaint on file.   History of Present Illness:    Deborah [name] is a 76 y.o. female with a hx of  hypertension, palpitations, history of DVT, hyperlipidemia, and family history of vascular disease.   Doing well, has lost greater than 20 pounds over the past year, has no cardiac complaints.  There is some question about the dose of losartan.  She takes a whole tablet every day.  Tablet sizes 100 mg.  She will  verify this.  Past Medical History:  Diagnosis Date   Clotting disorder ([place])    DVT   DVT (deep venous thrombosis) (HCC)    2-3X   GERD (gastroesophageal reflux disease)    Hyperlipidemia    Hypertension    Ocular migraine    Pneumonia    UTI (urinary tract infection) 10/10/13   E coli 70,000 colonies    Past Surgical History:  Procedure Laterality Date   DVT     2-3 X ; on BCP with first   ENDOVENOUS ABLATION SAPHENOUS VEIN W/ LASER  4496   complicated by DVT    ENDOVENOUS ABLATION SAPHENOUS VEIN W/ LASER  2013   Dr Renaldo Reel   no colonoscopy     [place] reviewed   TONSILLECTOMY     TUBAL LIGATION     G3 P3   WISDOM TOOTH EXTRACTION      Current Medications: Current Meds  Medication Sig   albuterol (PROVENTIL HFA;VENTOLIN HFA) 108 (90 Base) MCG/ACT inhaler Inhale 2 puffs into the lungs every 4 (four) hours as needed for wheezing.   aspirin EC 81 MG tablet Take 81 mg by mouth daily. Swallow whole.   Cholecalciferol (VITAMIN D3) 1000 units CAPS Take 2,000 Units by mouth daily.    hydrochlorothiazide (HYDRODIURIL) 25 MG tablet Take 1 tablet (25 mg total) by mouth daily.   losartan (COZAAR) 100 MG tablet Take 0.5 tablets (50 mg total) by mouth daily. Please keep upcoming appt in July 2023 with Dr. Tamala Julian before anymore  refills. Thank you Final Attempt   Multiple Vitamins-Minerals (CENTRUM SILVER ULTRA WOMENS PO) Take 1 tablet by mouth.   Nebivolol HCl 20 MG TABS TAKE 1 TABLET BY MOUTH  DAILY   OVER THE COUNTER MEDICATION Take 2 capsules by mouth in the morning. Algea-Cal Calcium supplement 2 caps daily.     Allergies:   Statins and Propoxyphene n-acetaminophen   Social History   Socioeconomic History   Marital status: Married    Spouse name: Not on file   Number of children: 3   Years of education: Not on file   Highest education level: Not on file  Occupational History   Occupation: retired Therapist, sports  Tobacco Use   Smoking status: Former    Types: Cigarettes    Quit date: 10/14/1969    Years since quitting: 52.5   Smokeless tobacco: Never   Tobacco comments:    Quit at age 63 (smoked x 2 years, up to 1 ppd)   Substance and Sexual Activity   Alcohol use: Yes    Alcohol/week: 2.0 standard drinks of alcohol    Types: 2 Glasses of wine per week    Comment: Red wine (2 glasses weekly or less)  Drug use: No   Sexual activity: Not on file  Other Topics Concern   Not on file  Social History Narrative   Not on file   Social Determinants of Health   Financial Resource Strain: Low Risk  (03/12/2022)   Overall Financial Resource Strain (CARDIA)    Difficulty of Paying Living Expenses: Not hard at all  Food Insecurity: No Food Insecurity (03/12/2022)   Hunger Vital Sign    Worried About Running Out of Food in the Last Year: Never true    Ran Out of Food in the Last Year: Never true  Transportation Needs: No Transportation Needs (03/09/2021)   PRAPARE - Hydrologist (Medical): No    Lack of Transportation (Non-Medical): No  Physical Activity: Sufficiently Active (03/12/2022)   Exercise Vital Sign    Days of Exercise per Week: 5 days    Minutes of Exercise per Session: 30 min  Stress: No Stress Concern Present (03/12/2022)   [place]    Feeling of Stress : Not at all  Social Connections: [place] (03/12/2022)   Social Connection and Isolation Panel [NHANES]    Frequency of Communication with Friends and Family: More than three times a week    Frequency of Social Gatherings with Friends and Family: More than three times a week    Attends Religious Services: More than 4 times per year    Active Member of [employer] or Organizations: Yes    Attends Music therapist: More than 4 times per year    Marital Status: Married     Family History: The patient's family history includes Arthritis in her mother; Asthma in her father and sister; Breast cancer in her cousin; Breast cancer (age of onset: 90) in her sister; COPD in her mother; Cancer in her father; Diabetes in her brother, father, and paternal grandmother; Emphysema in her paternal grandfather; Heart attack in her father; Hypertension in her father, paternal grandmother, and sister; Kidney disease in her sister; Kidney failure in her sister; Melanoma (age of onset: 34) in her mother; Other in her sister; Stroke in her paternal grandfather and paternal grandmother; Transient ischemic attack in her mother. There is no history of Colon polyps, Esophageal cancer, Pancreatic cancer, Rectal cancer, or Stomach cancer.  ROS:   Please see the history of present illness.    No orthopnea all other systems reviewed and are negative.  EKGs/Labs/Other Studies Reviewed:    The following studies were reviewed today: No new data  EKG:  EKG low voltage, sinus rhythm, poor R wave progression V1 through V4.  Leftward axis.  Compared to prior, no change from August 2021.    Recent Labs: 06/21/2021: Hemoglobin 15.8; Platelets 273.0 11/14/2021: ALT 16; BUN 19; Creatinine, Ser 1.03; Potassium 3.7; Sodium 139; TSH 2.85  Recent Lipid Panel    Component Value Date/Time   CHOL 189 02/15/2022 0854   TRIG 115.0 02/15/2022 0854   HDL 54.80  02/15/2022 0854   CHOLHDL 3 02/15/2022 0854   VLDL 23.0 02/15/2022 0854   LDLCALC 111 (H) 02/15/2022 0854   LDLDIRECT 147.1 07/30/2012 0850    Physical Exam:    VS:  BP 140/80   Pulse 71   Ht '5\' 3"'$  (1.6 m)   Wt 177 lb 6.4 oz (80.5 kg)   SpO2 95%   BMI 31.42 kg/m     Wt Readings from Last 3 Encounters:  04/17/22 177 lb 6.4  oz (80.5 kg)  03/12/22 177 lb (80.3 kg)  01/23/22 196 lb (88.9 kg)     GEN: BMI 31. No acute distress HEENT: Normal NECK: No JVD. LYMPHATICS: No lymphadenopathy CARDIAC: No  murmur. RRR no gallop, or edema. VASCULAR:  Normal Pulses. No bruits. RESPIRATORY:  Clear to auscultation without rales, wheezing or rhonchi  ABDOMEN: Soft, non-tender, non-distended, No pulsatile mass, MUSCULOSKELETAL: No deformity  SKIN: Warm and dry NEUROLOGIC:  Alert and oriented x 3 PSYCHIATRIC:  Normal affect   ASSESSMENT:    1. Essential hypertension   2. SVT (supraventricular tachycardia) (HCC)   3. Other hyperlipidemia    PLAN:    In order of problems listed above:  Controlled.  Continue current therapy which is HCTZ 25 mg a day, nebivolol 20 mg/day, and losartan 100 mg/day according to the patient.  She will verify tablet size. Not a significant problem currently. Not currently on statin therapy.   Medication Adjustments/Labs and Tests Ordered: Current medicines are reviewed at length with the patient today.  Concerns regarding medicines are outlined above.  Orders Placed This Encounter  Procedures   EKG 12-Lead   No orders of the defined types were placed in this encounter.   Patient Instructions  Medication Instructions:  Your physician recommends that you continue on your current medications as directed. Please refer to the Current Medication list given to you today.  *If you need a refill on your cardiac medications before your next appointment, please call your pharmacy*  Please call our office at [phone] (or send message via MyChart) to let us  know how many milligrams of Losartan you have been taking.  Lab Work: NONE  Testing/Procedures: NONE  Follow-Up: At [facility], you and your health needs are our priority.  As part of our continuing mission to provide you with exceptional heart care, we have created designated Provider Care Teams.  These Care Teams include your primary Cardiologist (physician) and Advanced Practice Providers (APPs -  Physician Assistants and Nurse Practitioners) who all work together to provide you with the care you need, when you need it.  Your next appointment:   1 year(s)  The format for your next appointment:   In Person  Provider:   Sinclair Grooms, MD {   Important Information About Sugar         Signed, Sinclair Grooms, MD  04/17/2022 4:01 PM    [place]

## 2022-04-17 ENCOUNTER — Ambulatory Visit: Payer: Medicare Other | Admitting: Interventional Cardiology

## 2022-04-17 ENCOUNTER — Encounter: Payer: Self-pay | Admitting: Interventional Cardiology

## 2022-04-17 ENCOUNTER — Other Ambulatory Visit: Payer: Self-pay | Admitting: Interventional Cardiology

## 2022-04-17 VITALS — BP 140/80 | HR 71 | Ht 63.0 in | Wt 177.4 lb

## 2022-04-17 DIAGNOSIS — I471 Supraventricular tachycardia: Secondary | ICD-10-CM

## 2022-04-17 DIAGNOSIS — E7849 Other hyperlipidemia: Secondary | ICD-10-CM

## 2022-04-17 DIAGNOSIS — I10 Essential (primary) hypertension: Secondary | ICD-10-CM

## 2022-04-17 NOTE — Patient Instructions (Signed)
Medication Instructions:  Your physician recommends that you continue on your current medications as directed. Please refer to the Current Medication list given to you today.  *If you need a refill on your cardiac medications before your next appointment, please call your pharmacy*  Please call our office at [phone] (or send message via MyChart) to let us know how many milligrams of Losartan you have been taking.  Lab Work: NONE  Testing/Procedures: NONE  Follow-Up: At [facility], you and your health needs are our priority.  As part of our continuing mission to provide you with exceptional heart care, we have created designated Provider Care Teams.  These Care Teams include your primary Cardiologist (physician) and Advanced Practice Providers (APPs -  Physician Assistants and Nurse Practitioners) who all work together to provide you with the care you need, when you need it.  Your next appointment:   1 year(s)  The format for your next appointment:   In Person  Provider:   Sinclair Grooms, MD {   Important Information About Sugar

## 2022-04-22 MED ORDER — LOSARTAN POTASSIUM 100 MG PO TABS: 100.0000 mg | ORAL_TABLET | Freq: Every day | ORAL | 3 refills | Status: DC

## 2022-04-30 NOTE — Progress Notes (Unsigned)
Deborah [name] [place] Phone: [phone] Subjective:   Deborah [name], am serving as a scribe for Dr. Hulan Saas.   I'm seeing this patient by the request  of:  Binnie Rail, MD  CC: Right shoulder exam pain  BSW:[id number]  01/23/2022 Patient does have moderate arthritic changes but pain seems to be more over the lateral aspect of the hip.  We discussed with patient that it could be secondary to this or the possibility of some of the degenerative disc disease of the lumbar spine.  Patient is doing well and not feeling like having any significant worsening.  Will increase activity as tolerated.  Patient will continue to stay active.  We will follow-up again in 3 to 4 months.  Update 05/01/2022 Deborah [name] is a 76 y.o. female coming in with complaint of L hip pain. Patient states she has been more active and is [name] longer having pain or stiffness.   Also c/o R shoulder pain in posterior aspect. Also notes some referred pain at TMJ joint. Pain worse with flexion and she notes weakness in the arm. Has numb spot in tricep that has been there for quite a while.       Past Medical History:  Diagnosis Date   Clotting disorder ([place])    DVT   DVT (deep venous thrombosis) (HCC)    2-3X   GERD (gastroesophageal reflux disease)    Hyperlipidemia    Hypertension    Ocular migraine    Pneumonia    UTI (urinary tract infection) 10/10/13   E coli 70,000 colonies   Past Surgical History:  Procedure Laterality Date   DVT     2-3 X ; on BCP with first   ENDOVENOUS ABLATION SAPHENOUS VEIN W/ LASER  6659   complicated by DVT    ENDOVENOUS ABLATION SAPHENOUS VEIN W/ LASER  2013   Dr Renaldo Reel   [name] colonoscopy     [place] reviewed   TONSILLECTOMY     TUBAL LIGATION     G3 P3   WISDOM TOOTH EXTRACTION     Social History   Socioeconomic History   Marital status: Married    Spouse name: Not on file   Number  of children: 3   Years of education: Not on file   Highest education level: Not on file  Occupational History   Occupation: retired Therapist, sports  Tobacco Use   Smoking status: Former    Types: Cigarettes    Quit date: 10/14/1969    Years since quitting: 52.5   Smokeless tobacco: Never   Tobacco comments:    Quit at age 74 (smoked x 2 years, up to 1 ppd)   Substance and Sexual Activity   Alcohol use: Yes    Alcohol/week: 2.0 standard drinks of alcohol    Types: 2 Glasses of wine per week    Comment: Red wine (2 glasses weekly or less)    Drug use: [name]   Sexual activity: Not on file  Other Topics Concern   Not on file  Social History Narrative   Not on file   Social Determinants of Health   Financial Resource Strain: Low Risk  (03/12/2022)   Overall Financial Resource Strain (CARDIA)    Difficulty of Paying Living Expenses: Not hard at all  Food Insecurity: [name] Food Insecurity (03/12/2022)   Hunger Vital Sign    Worried About Running Out of Food in the Last Year:  Never true    Ran Out of Food in the Last Year: Never true  Transportation Needs: [name] Transportation Needs (03/09/2021)   PRAPARE - Hydrologist (Medical): [name]    Lack of Transportation (Non-Medical): [name]  Physical Activity: Sufficiently Active (03/12/2022)   Exercise Vital Sign    Days of Exercise per Week: 5 days    Minutes of Exercise per Session: 30 min  Stress: [name] Stress Concern Present (03/12/2022)   [place]    Feeling of Stress : Not at all  Social Connections: [place] (03/12/2022)   Social Connection and Isolation Panel [NHANES]    Frequency of Communication with Friends and Family: More than three times a week    Frequency of Social Gatherings with Friends and Family: More than three times a week    Attends Religious Services: More than 4 times per year    Active Member of [employer] or Organizations: Yes    Attends Arts development officer: More than 4 times per year    Marital Status: Married   Allergies  Allergen Reactions   Statins     Muscle aches with some, elevated lft with crestor   Propoxyphene N-Acetaminophen     REACTION: GI Upset   Family History  Problem Relation Age of Onset   Arthritis Mother        OA   Melanoma Mother 71       in situ   Transient ischemic attack Mother        late 12s   COPD Mother    Cancer Father        renal cancer   Diabetes Father    Heart attack Father        in 76s   Hypertension Father    Asthma Father    Hypertension Sister    Asthma Sister    Kidney disease Sister        ? from Celebrex   Kidney failure Sister    Breast cancer Sister 45   Other Sister        Chron's or UC   Stroke Paternal Grandmother        in 40s   Diabetes Paternal Grandmother    Hypertension Paternal Grandmother    Emphysema Paternal Grandfather    Stroke Paternal Grandfather        in 53s   Breast cancer Cousin        early 57's   Diabetes Brother    Colon polyps Neg Hx    Esophageal cancer Neg Hx    Pancreatic cancer Neg Hx    Rectal cancer Neg Hx    Stomach cancer Neg Hx      Current Outpatient Medications (Cardiovascular):    hydrochlorothiazide (HYDRODIURIL) 25 MG tablet, TAKE 1 TABLET (25 MG TOTAL) BY MOUTH DAILY.   losartan (COZAAR) 100 MG tablet, Take 1 tablet (100 mg total) by mouth daily.   Nebivolol HCl 20 MG TABS, TAKE 1 TABLET BY MOUTH  DAILY  Current Outpatient Medications (Respiratory):    albuterol (PROVENTIL HFA;VENTOLIN HFA) 108 (90 Base) MCG/ACT inhaler, Inhale 2 puffs into the lungs every 4 (four) hours as needed for wheezing.  Current Outpatient Medications (Analgesics):    aspirin EC 81 MG tablet, Take 81 mg by mouth daily. Swallow whole.   Current Outpatient Medications (Other):    Cholecalciferol (VITAMIN D3) 1000 units CAPS, Take 2,000 Units by mouth daily.  Multiple Vitamins-Minerals (CENTRUM SILVER ULTRA WOMENS PO), Take  1 tablet by mouth.   OVER THE COUNTER MEDICATION, Take 2 capsules by mouth in the morning. Algea-Cal Calcium supplement 2 caps daily.   Reviewed prior external information including notes and imaging from  primary care provider As well as notes that were available from care everywhere and other healthcare systems.  Past medical history, social, surgical and family history all reviewed in electronic medical record.  [name] pertanent information unless stated regarding to the chief complaint.   Review of Systems:  [name] headache, visual changes, nausea, vomiting, diarrhea, constipation, dizziness, abdominal pain, skin rash, fevers, chills, night sweats, weight loss, swollen lymph nodes, body aches, joint swelling, chest pain, shortness of breath, mood changes. POSITIVE muscle aches  Objective  Blood pressure 132/80, pulse (!) 45, height '5\' 3"'$  (1.6 m), weight 175 lb (79.4 kg), SpO2 96 %.   General: [name] apparent distress alert and oriented x3 mood and affect normal, dressed appropriately.  HEENT: Pupils equal, extraocular movements intact  Respiratory: Patient's speak in full sentences and does not appear short of breath  Cardiovascular: [name] lower extremity edema, non tender, [name] erythema  Patient has lost weight and her back seems to be stronger.  Not having any symptoms Patient does have tenderness of the right shoulder.  Positive mild empty can.  Positive O'Brien's.  Limited muscular skeletal ultrasound was performed and interpreted by Hulan Saas, M  Limited ultrasound of patient's right shoulder shows that patient has some very mild hypoechoic changes in the bicep tendon sheath at the insertion of the anterior labrum.  In addition patient does have a fairly large high-grade partial tear with retraction of the supraspinatus noted.  Appears to be acute on chronic with some mild chronic scarring.  [id number]; 15 additional minutes spent for Therapeutic exercises as stated in above notes.  This included  exercises focusing on stretching, strengthening, with significant focus on eccentric aspects.   Long term goals include an improvement in range of motion, strength, endurance as well as avoiding reinjury. Patient's frequency would include in 1-2 times a day, 3-5 times a week for a duration of 6-12 weeks. Shoulder Exercises that included:  Basic scapular stabilization to include adduction and depression of scapula [place]ion, focusing on proper movement and good control Internal and External rotation utilizing a theraband, with elbow tucked at side entire time Rows with theraband   Proper technique shown and discussed handout in great detail with ATC.  All questions were discussed and answered.      Impression and Recommendations:     The above documentation has been reviewed and is accurate and complete Lyndal Pulley, DO

## 2022-05-01 ENCOUNTER — Encounter: Payer: Self-pay | Admitting: Family Medicine

## 2022-05-01 ENCOUNTER — Ambulatory Visit (INDEPENDENT_AMBULATORY_CARE_PROVIDER_SITE_OTHER): Payer: Medicare Other | Admitting: Family Medicine

## 2022-05-01 ENCOUNTER — Ambulatory Visit: Payer: Self-pay

## 2022-05-01 VITALS — BP 132/80 | HR 45 | Ht 63.0 in | Wt 175.0 lb

## 2022-05-01 DIAGNOSIS — M25511 Pain in right shoulder: Secondary | ICD-10-CM

## 2022-05-01 DIAGNOSIS — M75111 Incomplete rotator cuff tear or rupture of right shoulder, not specified as traumatic: Secondary | ICD-10-CM | POA: Diagnosis not present

## 2022-05-01 DIAGNOSIS — M75101 Unspecified rotator cuff tear or rupture of right shoulder, not specified as traumatic: Secondary | ICD-10-CM | POA: Insufficient documentation

## 2022-05-01 NOTE — Patient Instructions (Signed)
Read about PRP Exercises Ice 20 min 2x a day Voltaren gel See me in 6-8 weeks

## 2022-05-01 NOTE — Assessment & Plan Note (Signed)
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  Mild retraction noted as well.  Patient is elected to try conservative therapy.  Not a significant amount of hypoechoic changes so we will hold on any type of steroid injection at the moment.  Patient given home exercises.  Follow-up again in 6 to 8 weeks.  Worsening pain will consider the possibility of formal physical therapy or possible injection.  Did discuss PRP as well.

## 2022-05-16 ENCOUNTER — Other Ambulatory Visit: Payer: Self-pay | Admitting: Interventional Cardiology

## 2022-05-16 NOTE — Telephone Encounter (Signed)
Pt's medication was sent to pt's pharmacy as requested. Confirmation received.  °

## 2022-06-12 NOTE — Progress Notes (Signed)
[place] Phone: [phone] Subjective:   Deborah [name], am serving as a scribe for Dr. Hulan Saas.  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  Mild retraction noted as well.  Patient is elected to try conservative therapy.  Not a significant amount of hypoechoic changes so we will hold on any type of steroid injection at the moment.  Patient given home exercises.  Follow-up again in 6 to 8 weeks.  Worsening pain will consider the possibility of formal physical therapy or possible injection.  Did discuss PRP as well.  Update 06/19/2022 Deborah [name] is a 76 y.o. female coming in with complaint of R shoulder pain. Patient states that she notices improvement but knows that doing all of the housework she does is slowing progress. Patient feels good today but does have some achiness in the joint. Mornings she notices pain from sleeping. Has been using ice in the mornings.        Past Medical History:  Diagnosis Date   Clotting disorder ([place])    DVT   DVT (deep venous thrombosis) (HCC)    2-3X   GERD (gastroesophageal reflux disease)    Hyperlipidemia    Hypertension    Ocular migraine    Pneumonia    UTI (urinary tract infection) 10/10/13   E coli 70,000 colonies   Past Surgical History:  Procedure Laterality Date   DVT     2-3 X ; on BCP with first   ENDOVENOUS ABLATION SAPHENOUS VEIN W/ LASER  2774   complicated by DVT    ENDOVENOUS ABLATION SAPHENOUS VEIN W/ LASER  2013   Dr Renaldo Reel   [name] colonoscopy     [place] reviewed   TONSILLECTOMY     TUBAL LIGATION     G3 P3   WISDOM TOOTH EXTRACTION     Social History   Socioeconomic History   Marital status: Married    Spouse name: Not on  file   Number of children: 3   Years of education: Not on file   Highest education level: Not on file  Occupational History   Occupation: retired Therapist, sports  Tobacco Use   Smoking status: Former    Types: Cigarettes    Quit date: 10/14/1969    Years since quitting: 52.7   Smokeless tobacco: Never   Tobacco comments:    Quit at age 64 (smoked x 2 years, up to 1 ppd)   Substance and Sexual Activity   Alcohol use: Yes    Alcohol/week: 2.0 standard drinks of alcohol    Types: 2 Glasses of wine per week    Comment: Red wine (2 glasses weekly or less)    Drug use: [name]   Sexual activity: Not on file  Other Topics Concern   Not on file  Social History Narrative   Not on file   Social Determinants of Health   Financial Resource Strain: Low Risk  (03/12/2022)   Overall Financial Resource Strain (CARDIA)    Difficulty of Paying Living Expenses: Not hard at all  Food Insecurity: [name] Food Insecurity (03/12/2022)   Hunger Vital Sign    Worried About Running Out of Food in the Last Year: Never true  Ran Out of Food in the Last Year: Never true  Transportation Needs: [name] Transportation Needs (03/09/2021)   PRAPARE - Hydrologist (Medical): [name]    Lack of Transportation (Non-Medical): [name]  Physical Activity: Sufficiently Active (03/12/2022)   Exercise Vital Sign    Days of Exercise per Week: 5 days    Minutes of Exercise per Session: 30 min  Stress: [name] Stress Concern Present (03/12/2022)   [place]    Feeling of Stress : Not at all  Social Connections: [place] (03/12/2022)   Social Connection and Isolation Panel [NHANES]    Frequency of Communication with Friends and Family: More than three times a week    Frequency of Social Gatherings with Friends and Family: More than three times a week    Attends Religious Services: More than 4 times per year    Active Member of [employer] or Organizations: Yes     Attends Music therapist: More than 4 times per year    Marital Status: Married   Allergies  Allergen Reactions   Statins     Muscle aches with some, elevated lft with crestor   Propoxyphene N-Acetaminophen     REACTION: GI Upset   Family History  Problem Relation Age of Onset   Arthritis Mother        OA   Melanoma Mother 68       in situ   Transient ischemic attack Mother        late 44s   COPD Mother    Cancer Father        renal cancer   Diabetes Father    Heart attack Father        in 78s   Hypertension Father    Asthma Father    Hypertension Sister    Asthma Sister    Kidney disease Sister        ? from Celebrex   Kidney failure Sister    Breast cancer Sister 50   Other Sister        Chron's or UC   Stroke Paternal Grandmother        in 36s   Diabetes Paternal Grandmother    Hypertension Paternal Grandmother    Emphysema Paternal Grandfather    Stroke Paternal Grandfather        in 33s   Breast cancer Cousin        early 82's   Diabetes Brother    Colon polyps Neg Hx    Esophageal cancer Neg Hx    Pancreatic cancer Neg Hx    Rectal cancer Neg Hx    Stomach cancer Neg Hx      Current Outpatient Medications (Cardiovascular):    hydrochlorothiazide (HYDRODIURIL) 25 MG tablet, TAKE 1 TABLET (25 MG TOTAL) BY MOUTH DAILY.   losartan (COZAAR) 100 MG tablet, Take 1 tablet (100 mg total) by mouth daily.   Nebivolol HCl 20 MG TABS, TAKE 1 TABLET BY MOUTH  DAILY  Current Outpatient Medications (Respiratory):    albuterol (PROVENTIL HFA;VENTOLIN HFA) 108 (90 Base) MCG/ACT inhaler, Inhale 2 puffs into the lungs every 4 (four) hours as needed for wheezing.  Current Outpatient Medications (Analgesics):    aspirin EC 81 MG tablet, Take 81 mg by mouth daily. Swallow whole.   Current Outpatient Medications (Other):    Cholecalciferol (VITAMIN D3) 1000 units CAPS, Take 2,000 Units by mouth daily.    Multiple Vitamins-Minerals (  CENTRUM SILVER ULTRA  WOMENS PO), Take 1 tablet by mouth.   OVER THE COUNTER MEDICATION, Take 2 capsules by mouth in the morning. Algea-Cal Calcium supplement 2 caps daily.   Reviewed prior external information including notes and imaging from  primary care provider As well as notes that were available from care everywhere and other healthcare systems.  Past medical history, social, surgical and family history all reviewed in electronic medical record.  [name] pertanent information unless stated regarding to the chief complaint.   Review of Systems:  [name] headache, visual changes, nausea, vomiting, diarrhea, constipation, dizziness, abdominal pain, skin rash, fevers, chills, night sweats, weight loss, swollen lymph nodes, body aches, joint swelling, chest pain, shortness of breath, mood changes. POSITIVE muscle aches  Objective  Blood pressure 138/84, pulse 64, height '5\' 3"'$  (1.6 m), weight 173 lb (78.5 kg), SpO2 97 %.   General: [name] apparent distress alert and oriented x3 mood and affect normal, dressed appropriately.  HEENT: Pupils equal, extraocular movements intact  Respiratory: Patient's speak in full sentences and does not appear short of breath  Cardiovascular: [name] lower extremity edema, non tender, [name] erythema  Right shoulder exam: Improvement in range of motion.  Still has pain with empty can.  Negative crossover though noted.  Negative O'Brien which is an improvement  Limited muscular skeletal ultrasound was performed and interpreted by Hulan Saas, M  Limited ultrasound of patient's rotator cuff tear shows that there is still some degenerative changes noted with some calcific changes noted and does show possible otherwise fairly unremarkable. Impression: Interval improvement noted.    Impression and Recommendations:    The above documentation has been reviewed and is accurate and complete Lyndal Pulley, DO

## 2022-06-13 ENCOUNTER — Other Ambulatory Visit: Payer: Self-pay | Admitting: Interventional Cardiology

## 2022-06-19 ENCOUNTER — Ambulatory Visit: Payer: Self-pay

## 2022-06-19 ENCOUNTER — Encounter: Payer: Self-pay | Admitting: Family Medicine

## 2022-06-19 ENCOUNTER — Ambulatory Visit: Payer: Medicare Other | Admitting: Family Medicine

## 2022-06-19 VITALS — BP 138/84 | HR 64 | Ht 63.0 in | Wt 173.0 lb

## 2022-06-19 DIAGNOSIS — M25511 Pain in right shoulder: Secondary | ICD-10-CM | POA: Diagnosis not present

## 2022-06-19 DIAGNOSIS — M75111 Incomplete rotator cuff tear or rupture of right shoulder, not specified as traumatic: Secondary | ICD-10-CM

## 2022-06-19 NOTE — Patient Instructions (Signed)
Good luck with the dog Keep doing what you are doing  See me in 2 months

## 2022-06-19 NOTE — Assessment & Plan Note (Signed)
Patient on ultrasound does seem to have some improvement noted.  Discussed icing regimen and home exercises.  Discussed which activities to do which ones to avoid still.  We discussed certain activities as well.  Increase activity slowly.  Follow-up again in 6 to 8 weeks.

## 2022-07-09 DIAGNOSIS — H2511 Age-related nuclear cataract, right eye: Secondary | ICD-10-CM | POA: Diagnosis not present

## 2022-07-09 DIAGNOSIS — H269 Unspecified cataract: Secondary | ICD-10-CM | POA: Diagnosis not present

## 2022-07-23 DIAGNOSIS — H2512 Age-related nuclear cataract, left eye: Secondary | ICD-10-CM | POA: Diagnosis not present

## 2022-07-23 DIAGNOSIS — H269 Unspecified cataract: Secondary | ICD-10-CM | POA: Diagnosis not present

## 2022-08-01 ENCOUNTER — Encounter: Payer: Self-pay | Admitting: Internal Medicine

## 2022-08-01 NOTE — Progress Notes (Signed)
Subjective:    Patient ID: Deborah [name], female    DOB: [birth year], 76 y.o.   MRN: [id number]      HPI Deborah [name] is here for  Chief Complaint  Patient presents with   Ear Pain   Sore Throat         ? R ear infection, sore throat-symptoms started recently.  She was not sure if they were allergies or a possible cold.  She does have her grandkids living with them.  B/l caratact suregy in last month.  She was using eyedrops and she could feel the eyedrops going down her throat just not sure if that is related or not.  Sore throat on right and right ear pain.  She has had mild nasal congestion, postnasal drainage, little runny nose, sinus pressure and occasional dizziness.   Medications and allergies reviewed with patient and updated if appropriate.  Current Outpatient Medications on File Prior to Visit  Medication Sig Dispense Refill   albuterol (PROVENTIL HFA;VENTOLIN HFA) 108 (90 Base) MCG/ACT inhaler Inhale 2 puffs into the lungs every 4 (four) hours as needed for wheezing. 1 Inhaler 2   aspirin EC 81 MG tablet Take 81 mg by mouth daily. Swallow whole.     Cholecalciferol (VITAMIN D3) 1000 units CAPS Take 2,000 Units by mouth daily.      hydrochlorothiazide (HYDRODIURIL) 25 MG tablet TAKE 1 TABLET (25 MG TOTAL) BY MOUTH DAILY. 90 tablet 3   losartan (COZAAR) 100 MG tablet Take 1 tablet (100 mg total) by mouth daily. 90 tablet 3   Multiple Vitamins-Minerals (CENTRUM SILVER ULTRA WOMENS PO) Take 1 tablet by mouth.     Nebivolol HCl 20 MG TABS TAKE 1 TABLET BY MOUTH  DAILY 90 tablet 3   OVER THE COUNTER MEDICATION Take 2 capsules by mouth in the morning. Algea-Cal Calcium supplement 2 caps daily.     No current facility-administered medications on file prior to visit.    Review of Systems  Constitutional:  Negative for chills and fever.  HENT:  Positive for congestion (mild), ear pain, postnasal drip, rhinorrhea, sinus pressure and sore throat. Negative for  sinus pain.   Neurological:  Positive for dizziness. Negative for headaches.       Objective:   Vitals:   08/02/22 1041  BP: 138/74  Pulse: (!) 58  Temp: 98.7 F (37.1 C)  SpO2: 94%   BP Readings from Last 3 Encounters:  08/02/22 138/74  06/19/22 138/84  05/01/22 132/80   Wt Readings from Last 3 Encounters:  08/02/22 175 lb (79.4 kg)  06/19/22 173 lb (78.5 kg)  05/01/22 175 lb (79.4 kg)   Body mass index is 31 kg/m.    Physical Exam Constitutional:      General: She is not in acute distress.    Appearance: Normal appearance. She is not ill-appearing.  HENT:     Head: Normocephalic and atraumatic.     Right Ear: External ear normal.     Left Ear: Tympanic membrane, ear canal and external ear normal.     Ears:     Comments: Right TM dull with possible effusion, but no erythema, mild canal erythema    Mouth/Throat:     Mouth: Mucous membranes are moist.     Pharynx: No oropharyngeal exudate or posterior oropharyngeal erythema.  Eyes:     Conjunctiva/sclera: Conjunctivae normal.  Cardiovascular:     Rate and Rhythm: Normal rate and regular rhythm.  Pulmonary:     Effort:  Pulmonary effort is normal. No respiratory distress.     Breath sounds: Normal breath sounds. No wheezing or rales.  Musculoskeletal:     Cervical back: Neck supple. No tenderness.  Lymphadenopathy:     Cervical: No cervical adenopathy.  Skin:    General: Skin is warm and dry.  Neurological:     Mental Status: She is alert.            Assessment & Plan:    See Problem List for Assessment and Plan of chronic medical problems.

## 2022-08-02 ENCOUNTER — Encounter: Payer: Self-pay | Admitting: Internal Medicine

## 2022-08-02 ENCOUNTER — Ambulatory Visit (INDEPENDENT_AMBULATORY_CARE_PROVIDER_SITE_OTHER): Payer: Medicare Other | Admitting: Internal Medicine

## 2022-08-02 DIAGNOSIS — H6591 Unspecified nonsuppurative otitis media, right ear: Secondary | ICD-10-CM

## 2022-08-02 DIAGNOSIS — H6590 Unspecified nonsuppurative otitis media, unspecified ear: Secondary | ICD-10-CM | POA: Insufficient documentation

## 2022-08-02 DIAGNOSIS — I10 Essential (primary) hypertension: Secondary | ICD-10-CM

## 2022-08-02 MED ORDER — AMOXICILLIN-POT CLAVULANATE 875-125 MG PO TABS: 1.0000 | ORAL_TABLET | Freq: Two times a day (BID) | ORAL | 0 refills | Status: DC

## 2022-08-02 NOTE — Assessment & Plan Note (Signed)
Chronic Blood pressure well controlled Continue HCTZ 25 mg daily, losartan 929 mg daily, Bystolic 20 mg daily

## 2022-08-02 NOTE — Patient Instructions (Addendum)
      Medications changes include :   take the antibiotic if your symptoms are not improving     Return if symptoms worsen or fail to improve.

## 2022-08-02 NOTE — Assessment & Plan Note (Signed)
Acute Having some right ear pain, right-sided sore throat and on exam the right middle ear effusion No TM erythema, but concerned with her symptoms that she may be developing otitis media Has been taking some over-the-counter stuff and unsure if it is getting better or not-May be slightly better She will continue over-the-counter medications We will prescribe Augmentin 875-125 mg twice daily x1 week for her to take in the next 24-48 hours if symptoms are not improving or worsening

## 2022-08-08 ENCOUNTER — Other Ambulatory Visit: Payer: Self-pay | Admitting: Obstetrics and Gynecology

## 2022-08-08 DIAGNOSIS — Z1231 Encounter for screening mammogram for malignant neoplasm of breast: Secondary | ICD-10-CM

## 2022-08-21 ENCOUNTER — Ambulatory Visit: Payer: Medicare Other | Admitting: Family Medicine

## 2022-09-13 NOTE — Progress Notes (Unsigned)
[place] Phone: [phone] Subjective:   Deborah [name], am serving as a scribe for Dr. Hulan [name].  I'm seeing this patient by the request  of:  Deborah Rail, MD  CC: Right shoulder pain  [id number]  06/19/2022 Patient on ultrasound does seem to have some improvement noted. Discussed icing regimen and home exercises. Discussed which activities to do which ones to avoid still. We discussed certain activities as well. Increase activity slowly. Follow-up again in 6 to 8 weeks   Update 09/16/2022 Deborah [name] is a 76 y.o. female coming in with complaint of R shoulder pain.  Found to have a rotator cuff tear but did have some improvement noted at last exam.  Been 3 months at this time.  Patient states that she is [name] longer having pain.       Past Medical History:  Diagnosis Date   Clotting disorder ([place])    DVT   DVT (deep venous thrombosis) (HCC)    2-3X   GERD (gastroesophageal reflux disease)    Hyperlipidemia    Hypertension    Ocular migraine    Pneumonia    UTI (urinary tract infection) 10/10/13   E coli 70,000 colonies   Past Surgical History:  Procedure Laterality Date   DVT     2-3 X ; on BCP with first   ENDOVENOUS ABLATION SAPHENOUS VEIN W/ LASER  6734   complicated by DVT    ENDOVENOUS ABLATION SAPHENOUS VEIN W/ LASER  2013   Dr Deborah [name]   [name] colonoscopy     [place] reviewed   TONSILLECTOMY     TUBAL LIGATION     G3 P3   WISDOM TOOTH EXTRACTION     Social History   Socioeconomic History   Marital status: Married    Spouse name: Not on file   Number of children: 3   Years of education: Not on file   Highest education level: Not on file  Occupational History   Occupation: retired Therapist, sports  Tobacco Use   Smoking status: Former    Types: Cigarettes    Quit date: 10/14/1969    Years since quitting: 52.9   Smokeless tobacco: Never   Tobacco comments:    Quit at  age 59 (smoked x 2 years, up to 1 ppd)   Substance and Sexual Activity   Alcohol use: Yes    Alcohol/week: 2.0 standard drinks of alcohol    Types: 2 Glasses of wine per week    Comment: Red wine (2 glasses weekly or less)    Drug use: [name]   Sexual activity: Not on file  Other Topics Concern   Not on file  Social History Narrative   Not on file   Social Determinants of Health   Financial Resource Strain: Low Risk  (03/12/2022)   Overall Financial Resource Strain (CARDIA)    Difficulty of Paying Living Expenses: Not hard at all  Food Insecurity: [name] Food Insecurity (03/12/2022)   Hunger Vital Sign    Worried About Running Out of Food in the Last Year: Never true    Ran Out of Food in the Last Year: Never true  Transportation Needs: [name] Transportation Needs (03/09/2021)   PRAPARE - Hydrologist (Medical): [name]    Lack of Transportation (Non-Medical): [name]  Physical Activity: Sufficiently Active (03/12/2022)   Exercise Vital Sign    Days of Exercise per Week: 5  days    Minutes of Exercise per Session: 30 min  Stress: [name] Stress Concern Present (03/12/2022)   [place]    Feeling of Stress : Not at all  Social Connections: [place] (03/12/2022)   Social Connection and Isolation Panel [NHANES]    Frequency of Communication with Friends and Family: More than three times a week    Frequency of Social Gatherings with Friends and Family: More than three times a week    Attends Religious Services: More than 4 times per year    Active Member of [employer] or Organizations: Yes    Attends Music therapist: More than 4 times per year    Marital Status: Married   Allergies  Allergen Reactions   Statins     Muscle aches with some, elevated lft with crestor   Propoxyphene N-Acetaminophen     REACTION: GI Upset   Family History  Problem Relation Age of Onset   Arthritis Mother         OA   Melanoma Mother 53       in situ   Transient ischemic attack Mother        late 51s   COPD Mother    Cancer Father        renal cancer   Diabetes Father    Heart attack Father        in 62s   Hypertension Father    Asthma Father    Hypertension Sister    Asthma Sister    Kidney disease Sister        ? from Celebrex   Kidney failure Sister    Breast cancer Sister 67   Other Sister        Chron's or UC   Stroke Paternal Grandmother        in 64s   Diabetes Paternal Grandmother    Hypertension Paternal Grandmother    Emphysema Paternal Grandfather    Stroke Paternal Grandfather        in 84s   Breast cancer Cousin        early 6's   Diabetes Brother    Colon polyps Neg Hx    Esophageal cancer Neg Hx    Pancreatic cancer Neg Hx    Rectal cancer Neg Hx    Stomach cancer Neg Hx      Current Outpatient Medications (Cardiovascular):    hydrochlorothiazide (HYDRODIURIL) 25 MG tablet, TAKE 1 TABLET (25 MG TOTAL) BY MOUTH DAILY.   losartan (COZAAR) 100 MG tablet, Take 1 tablet (100 mg total) by mouth daily.   Nebivolol HCl 20 MG TABS, TAKE 1 TABLET BY MOUTH  DAILY  Current Outpatient Medications (Respiratory):    albuterol (PROVENTIL HFA;VENTOLIN HFA) 108 (90 Base) MCG/ACT inhaler, Inhale 2 puffs into the lungs every 4 (four) hours as needed for wheezing.  Current Outpatient Medications (Analgesics):    aspirin EC 81 MG tablet, Take 81 mg by mouth daily. Swallow whole.   Current Outpatient Medications (Other):    Cholecalciferol (VITAMIN D3) 1000 units CAPS, Take 2,000 Units by mouth daily.    Multiple Vitamins-Minerals (CENTRUM SILVER ULTRA WOMENS PO), Take 1 tablet by mouth.   OVER THE COUNTER MEDICATION, Take 2 capsules by mouth in the morning. Algea-Cal Calcium supplement 2 caps daily.   amoxicillin-clavulanate (AUGMENTIN) 875-125 MG tablet, Take 1 tablet by mouth 2 (two) times daily.     Objective  Blood pressure 138/82, pulse 77, height  $'5\' 3"'k$  (1.6 m),  weight 180 lb (81.6 kg), SpO2 94 %.   General: [name] apparent distress alert and oriented x3 mood and affect normal, dressed appropriately.  HEENT: Pupils equal, extraocular movements intact  Respiratory: Patient's speak in full sentences and does not appear short of breath  Cardiovascular: [name] lower extremity edema, non tender, [name] erythema   Shoulder exam shows good range of motion noted.  The patient has very mild impingement still noted.  Rotator cuff strength is nearly 5 out of 5 and symmetric to the contralateral side.  Limited muscular skeletal ultrasound was performed and interpreted by Deborah [name], M  Limited ultrasound shows some mild hypoechoic changes of the insertion of the supraspinatus that is consistent with more scar tissue formation.  Still has 1 little irregularity that is consistent with a longitudinal tear of the supraspinatus.  Hypoechoic changes consistent with a subacromial bursitis noted. Impression: Interval improvement with some subacromial bursitis   Impression and Recommendations:     The above documentation has been reviewed and is accurate and complete Lyndal Pulley, DO

## 2022-09-16 ENCOUNTER — Encounter: Payer: Self-pay | Admitting: Family Medicine

## 2022-09-16 ENCOUNTER — Ambulatory Visit: Payer: Medicare Other | Admitting: Family Medicine

## 2022-09-16 ENCOUNTER — Ambulatory Visit: Payer: Self-pay

## 2022-09-16 VITALS — BP 138/82 | HR 77 | Ht 63.0 in | Wt 180.0 lb

## 2022-09-16 DIAGNOSIS — M75111 Incomplete rotator cuff tear or rupture of right shoulder, not specified as traumatic: Secondary | ICD-10-CM | POA: Diagnosis not present

## 2022-09-16 DIAGNOSIS — M25511 Pain in right shoulder: Secondary | ICD-10-CM | POA: Diagnosis not present

## 2022-09-16 NOTE — Assessment & Plan Note (Signed)
Significant improvement noted at this time.  Patient is not having any significant pain but does still have some mild degenerative changes of the supraspinatus.  Patient though is not having it stopped her in any activities and is not taking any medications.  Can follow-up with me as needed

## 2022-10-04 ENCOUNTER — Ambulatory Visit: Payer: Medicare Other

## 2022-10-09 DIAGNOSIS — D485 Neoplasm of uncertain behavior of skin: Secondary | ICD-10-CM | POA: Diagnosis not present

## 2022-10-09 DIAGNOSIS — L814 Other melanin hyperpigmentation: Secondary | ICD-10-CM | POA: Diagnosis not present

## 2022-10-10 DIAGNOSIS — H40013 Open angle with borderline findings, low risk, bilateral: Secondary | ICD-10-CM | POA: Diagnosis not present

## 2022-10-17 DIAGNOSIS — D485 Neoplasm of uncertain behavior of skin: Secondary | ICD-10-CM | POA: Diagnosis not present

## 2022-11-19 ENCOUNTER — Ambulatory Visit: Payer: Medicare Other | Admitting: Cardiology

## 2022-11-23 NOTE — Progress Notes (Unsigned)
Cardiology Office Note:    Date:  11/27/2022   ID:  Deborah [name], DOB Feb 22, 1946, MRN [id number]  PCP:  Binnie Rail, MD   [place] [facility] Cardiologist:  Sinclair Grooms, MD (Inactive) {  Referring MD: Binnie Rail, MD    History of Present Illness:    Deborah [name] is a 77 y.o. female with a hx of HTN, prior DVT, HLD, and palpitations who was previously followed by Dr. Tamala Julian who now returns to clinic for follow-up.  Patient was last seen in clinic on 04/2022 where she was doing very well. Had lost 20lbs.  Today, patient overall feels well today. No chest pain, SOB, LE edema, or PND. 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  Blood pressure always elevated in MD office. Mainly runs 110-120/60-70s. Tolerating blood pressure meds as prescribed.   Past Medical History:  Diagnosis Date   Clotting disorder ([place])    DVT   DVT (deep venous thrombosis) (HCC)    2-3X   GERD (gastroesophageal reflux disease)    Hyperlipidemia    Hypertension    Ocular migraine    Pneumonia    UTI (urinary tract infection) 10/10/13   E coli 70,000 colonies    Past Surgical History:  Procedure Laterality Date   DVT     2-3 X ; on BCP with first   ENDOVENOUS ABLATION SAPHENOUS VEIN W/ LASER  123456   complicated by DVT    ENDOVENOUS ABLATION SAPHENOUS VEIN W/ LASER  2013   Dr Renaldo Reel   no colonoscopy     [place] reviewed   TONSILLECTOMY     TUBAL LIGATION     G3 P3   WISDOM TOOTH EXTRACTION      Current Medications: Current Meds  Medication Sig   albuterol (PROVENTIL HFA;VENTOLIN HFA) 108 (90 Base) MCG/ACT inhaler Inhale 2 puffs into the lungs every 4 (four) hours as needed for wheezing.   amoxicillin-clavulanate (AUGMENTIN) 875-125 MG tablet Take 1 tablet by mouth 2 (two) times  daily.   aspirin EC 81 MG tablet Take 81 mg by mouth daily. Swallow whole.   Cholecalciferol (VITAMIN D3) 1000 units CAPS Take 2,000 Units by mouth daily.    hydrochlorothiazide (HYDRODIURIL) 25 MG tablet TAKE 1 TABLET (25 MG TOTAL) BY MOUTH DAILY.   losartan (COZAAR) 100 MG tablet Take 1 tablet (100 mg total) by mouth daily.   Multiple Vitamins-Minerals (CENTRUM SILVER ULTRA WOMENS PO) Take 1 tablet by mouth.   Nebivolol HCl 20 MG TABS TAKE 1 TABLET BY MOUTH  DAILY   OVER THE COUNTER MEDICATION Take 2 capsules by mouth in the morning. [place]-Cal Calcium supplement 2 caps daily.     Allergies:   Statins and Propoxyphene n-acetaminophen   Social History   Socioeconomic History   Marital status: Married    Spouse name: Not on file   Number of children: 3   Years of education: Not on file   Highest education level: Not on file  Occupational History   Occupation: retired Therapist, sports  Tobacco Use   Smoking status: Former    Types: Cigarettes    Quit date: 10/14/1969    Years since quitting: 53.1   Smokeless tobacco: Never   Tobacco comments:    Quit at age 84 (smoked x 2 years, up to 1 ppd)  Substance and Sexual Activity   Alcohol use: Yes    Alcohol/week: 2.0 standard drinks of alcohol    Types: 2 Glasses of wine per week    Comment: Red wine (2 glasses weekly or less)    Drug use: No   Sexual activity: Not on file  Other Topics Concern   Not on file  Social History Narrative   Not on file   Social Determinants of Health   Financial Resource Strain: Low Risk  (03/12/2022)   Overall Financial Resource Strain (CARDIA)    Difficulty of Paying Living Expenses: Not hard at all  Food Insecurity: No Food Insecurity (03/12/2022)   Hunger Vital Sign    Worried About Running Out of Food in the Last Year: Never true    Ran Out of Food in the Last Year: Never true  Transportation Needs: No Transportation Needs (03/09/2021)   PRAPARE - Hydrologist (Medical): No     Lack of Transportation (Non-Medical): No  Physical Activity: Sufficiently Active (03/12/2022)   Exercise Vital Sign    Days of Exercise per Week: 5 days    Minutes of Exercise per Session: 30 min  Stress: No Stress Concern Present (03/12/2022)   [place]    Feeling of Stress : Not at all  Social Connections: [place] (03/12/2022)   Social Connection and Isolation Panel [NHANES]    Frequency of Communication with Friends and Family: More than three times a week    Frequency of Social Gatherings with Friends and Family: More than three times a week    Attends Religious Services: More than 4 times per year    Active Member of [employer] or Organizations: Yes    Attends Music therapist: More than 4 times per year    Marital Status: Married     Family History: The patient's family history includes Arthritis in her mother; Asthma in her father and sister; Breast cancer in her cousin; Breast cancer (age of onset: 14) in her sister; COPD in her mother; Cancer in her father; Diabetes in her brother, father, and paternal grandmother; Emphysema in her paternal grandfather; Heart attack in her father; Hypertension in her father, paternal grandmother, and sister; Kidney disease in her sister; Kidney failure in her sister; Melanoma (age of onset: 15) in her mother; Other in her sister; Stroke in her paternal grandfather and paternal grandmother; Transient ischemic attack in her mother. There is no history of Colon polyps, Esophageal cancer, Pancreatic cancer, Rectal cancer, or Stomach cancer.  ROS:   Please see the history of present illness.     All other systems reviewed and are negative.  EKGs/Labs/Other Studies Reviewed:    The following studies were reviewed today: TTE 12/11/16: Left ventricle:  The cavity size was normal. There was mild focal  basal hypertrophy of the septum. Systolic function was normal. The   estimated ejection fraction was in the range of 60% to 65%. Wall  motion was normal; there were no regional wall motion  abnormalities. Doppler parameters are consistent with abnormal left  ventricular relaxation (grade 1 diastolic dysfunction). Doppler  parameters are consistent with elevated ventricular end-diastolic  filling pressure.   -------------------------------------------------------------------  Aortic valve:   Trileaflet; normal thickness leaflets. Mobility was  not restricted.  Doppler:  Transvalvular velocity was within the  normal range. There was no stenosis. There was mild regurgitation.    -------------------------------------------------------------------  Aorta: Aortic root: The aortic  root was normal in size.   -------------------------------------------------------------------  Mitral valve:   Structurally normal valve.   Mobility was not  restricted.  Doppler:  Transvalvular velocity was within the normal  range. There was no evidence for stenosis. There was mild  regurgitation.    Peak gradient (D): 4 mm Hg.   -------------------------------------------------------------------  Left atrium:  The atrium was normal in size.   -------------------------------------------------------------------  Right ventricle:  The cavity size was normal. Wall thickness was  normal. Systolic function was normal.   -------------------------------------------------------------------  Pulmonic valve:    Structurally normal valve.   Cusp separation was  normal.  Doppler:  Transvalvular velocity was within the normal  range. There was no evidence for stenosis. There was no  regurgitation.   -------------------------------------------------------------------  Tricuspid valve:   Structurally normal valve.    Doppler:  Transvalvular velocity was within the normal range. There was mild  regurgitation.   -------------------------------------------------------------------   Pulmonary artery:   The main pulmonary artery was normal-sized.  Systolic pressure was moderately increased.   -------------------------------------------------------------------  Right atrium:  The atrium was normal in size.   -------------------------------------------------------------------  Pericardium: There was no pericardial effusion.   -------------------------------------------------------------------  Systemic veins:  Inferior vena cava: The vessel was normal in size.   EKG:  EKG is not ordered today.    Recent Labs: No results found for requested labs within last 365 days.  Recent Lipid Panel    Component Value Date/Time   CHOL 189 02/15/2022 0854   TRIG 115.0 02/15/2022 0854   HDL 54.80 02/15/2022 0854   CHOLHDL 3 02/15/2022 0854   VLDL 23.0 02/15/2022 0854   LDLCALC 111 (H) 02/15/2022 0854   LDLDIRECT 147.1 07/30/2012 0850     Risk Assessment/Calculations:           Physical Exam:    VS:  BP (!) 148/78   Pulse 61   Ht 5' 3"$  (1.6 m)   Wt 181 lb 9.6 oz (82.4 kg)   SpO2 97%   BMI 32.17 kg/m     Wt Readings from Last 3 Encounters:  11/27/22 181 lb 9.6 oz (82.4 kg)  09/16/22 180 lb (81.6 kg)  08/02/22 175 lb (79.4 kg)     GEN:  Well nourished, well developed in no acute distress HEENT: Normal NECK: No JVD; No carotid bruits CARDIAC: RRR, no murmurs, rubs, gallops RESPIRATORY:  Clear to auscultation without rales, wheezing or rhonchi  ABDOMEN: Soft, non-tender, non-distended MUSCULOSKELETAL:  No edema; No deformity  SKIN: Warm and dry NEUROLOGIC:  Alert and oriented x 3 PSYCHIATRIC:  Normal affect   ASSESSMENT:    1. Essential hypertension   2. Other hyperlipidemia   3. Pure hypercholesterolemia    PLAN:    In order of problems listed above:  #HTN: Well controlled and at goal. -Continue losartan 133m daily -Continue nebivolol 247mdaily -Continue HCTZ 2548maily  #Palpitations: Well controlled.  -Continue nebiviolol 46m68maily  #CV Screening: #HLD: LDL 111. Has allergy to statins. Interested in Ca score. If elevated, will plan for zetia 10mg60mly. -Check Ca score -Plan for zetia if Ca score elevated           Medication Adjustments/Labs and Tests Ordered: Current medicines are reviewed at length with the patient today.  Concerns regarding medicines are outlined above.  Orders Placed This Encounter  Procedures   CT CARDIAC SCORING (SELF PAY ONLY)   No orders of the defined types were placed in this encounter.   Patient  Instructions  Medication Instructions:   Your physician recommends that you continue on your current medications as directed. Please refer to the Current Medication list given to you today.  *If you need a refill on your cardiac medications before your next appointment, please call your pharmacy*    Testing/Procedures:  CARDIAC CT SCORING--(SELF PAY)   Follow-Up: At [facility] you and your health needs are our priority.  As part of our continuing mission to provide you with exceptional heart care, we have created designated Provider Care Teams.  These Care Teams include your primary Cardiologist (physician) and Advanced Practice Providers (APPs -  Physician Assistants and Nurse Practitioners) who all work together to provide you with the care you need, when you need it.  We recommend signing up for the patient portal called "MyChart".  Sign up information is provided on this After Visit Summary.  MyChart is used to connect with patients for Virtual Visits (Telemedicine).  Patients are able to view lab/test results, encounter notes, upcoming appointments, etc.  Non-urgent messages can be sent to your provider as well.   To learn more about what you can do with MyChart, go to [email or web address].    Your next appointment:   1 year(s)  Provider:   DR. Johney Frame     Signed, Freada Bergeron, MD  11/27/2022 11:47 AM    [place]

## 2022-11-27 ENCOUNTER — Encounter: Payer: Self-pay | Admitting: Cardiology

## 2022-11-27 ENCOUNTER — Ambulatory Visit: Payer: Medicare Other | Attending: Cardiology | Admitting: Cardiology

## 2022-11-27 VITALS — BP 148/78 | HR 61 | Ht 63.0 in | Wt 181.6 lb

## 2022-11-27 DIAGNOSIS — E7849 Other hyperlipidemia: Secondary | ICD-10-CM

## 2022-11-27 DIAGNOSIS — I10 Essential (primary) hypertension: Secondary | ICD-10-CM | POA: Diagnosis not present

## 2022-11-27 DIAGNOSIS — E7800 Pure hypercholesterolemia, unspecified: Secondary | ICD-10-CM

## 2022-11-27 NOTE — Patient Instructions (Signed)
Medication Instructions:   Your physician recommends that you continue on your current medications as directed. Please refer to the Current Medication list given to you today.  *If you need a refill on your cardiac medications before your next appointment, please call your pharmacy*    Testing/Procedures:  CARDIAC CT SCORING--(SELF PAY)   Follow-Up: At [facility], you and your health needs are our priority.  As part of our continuing mission to provide you with exceptional heart care, we have created designated Provider Care Teams.  These Care Teams include your primary Cardiologist (physician) and Advanced Practice Providers (APPs -  Physician Assistants and Nurse Practitioners) who all work together to provide you with the care you need, when you need it.  We recommend signing up for the patient portal called "MyChart".  Sign up information is provided on this After Visit Summary.  MyChart is used to connect with patients for Virtual Visits (Telemedicine).  Patients are able to view lab/test results, encounter notes, upcoming appointments, etc.  Non-urgent messages can be sent to your provider as well.   To learn more about what you can do with MyChart, go to [email or web address].    Your next appointment:   1 year(s)  Provider:   DR. Johney Frame

## 2022-12-02 ENCOUNTER — Ambulatory Visit
Admission: RE | Admit: 2022-12-02 | Discharge: 2022-12-02 | Disposition: A | Discharge status: Home / Self Care | Payer: Medicare Other | Source: Ambulatory Visit | Attending: Obstetrics and Gynecology | Admitting: Obstetrics and Gynecology

## 2022-12-02 DIAGNOSIS — Z1231 Encounter for screening mammogram for malignant neoplasm of breast: Secondary | ICD-10-CM

## 2022-12-13 ENCOUNTER — Other Ambulatory Visit: Payer: Self-pay | Admitting: *Deleted

## 2022-12-13 MED ORDER — LOSARTAN POTASSIUM 100 MG PO TABS: 100.0000 mg | ORAL_TABLET | Freq: Every day | ORAL | 3 refills | Status: DC

## 2023-01-03 ENCOUNTER — Ambulatory Visit (HOSPITAL_BASED_OUTPATIENT_CLINIC_OR_DEPARTMENT_OTHER)
Admission: RE | Admit: 2023-01-03 | Discharge: 2023-01-03 | Disposition: A | Discharge status: Home / Self Care | Payer: Medicare Other | Source: Ambulatory Visit | Attending: Cardiology | Admitting: Cardiology

## 2023-01-03 DIAGNOSIS — E7849 Other hyperlipidemia: Secondary | ICD-10-CM | POA: Insufficient documentation

## 2023-01-08 ENCOUNTER — Telehealth: Payer: Self-pay | Admitting: *Deleted

## 2023-01-08 DIAGNOSIS — R911 Solitary pulmonary nodule: Secondary | ICD-10-CM

## 2023-01-08 NOTE — Telephone Encounter (Signed)
The patient has been notified of the result and verbalized understanding.  All questions (if any) were answered.  Pt is aware of non-cardiac portion read and incidental finding of small nodule on her lung.  Pt is aware that Dr. Johney Frame and Radiologist both advised to better assess this, we need to do a non-contrast Chest CT on her sometime soon.  Pt is aware that I will place the order in the system and send a message to our [facility] Scheduling team to call her back and arrange this appt.  Pt wants this done at [facility] location.  Will endorse this to scheduling team.  Pt verbalized understanding and agrees with this plan.

## 2023-01-08 NOTE — Telephone Encounter (Signed)
-----   Message from Freada Bergeron, MD sent at 01/07/2023  8:21 PM EDT ----- Her CT scan of her lungs shows a small nodule. They recommend repeating a noncontrast CT chest to evaluate further.

## 2023-01-14 DIAGNOSIS — D225 Melanocytic nevi of trunk: Secondary | ICD-10-CM | POA: Diagnosis not present

## 2023-01-14 DIAGNOSIS — L905 Scar conditions and fibrosis of skin: Secondary | ICD-10-CM | POA: Diagnosis not present

## 2023-01-14 DIAGNOSIS — L814 Other melanin hyperpigmentation: Secondary | ICD-10-CM | POA: Diagnosis not present

## 2023-01-14 DIAGNOSIS — L570 Actinic keratosis: Secondary | ICD-10-CM | POA: Diagnosis not present

## 2023-01-14 DIAGNOSIS — L821 Other seborrheic keratosis: Secondary | ICD-10-CM | POA: Diagnosis not present

## 2023-01-15 ENCOUNTER — Ambulatory Visit (HOSPITAL_BASED_OUTPATIENT_CLINIC_OR_DEPARTMENT_OTHER)
Admission: RE | Admit: 2023-01-15 | Discharge: 2023-01-15 | Disposition: A | Discharge status: Home / Self Care | Payer: Medicare Other | Source: Ambulatory Visit | Attending: Cardiology | Admitting: Cardiology

## 2023-01-15 DIAGNOSIS — R918 Other nonspecific abnormal finding of lung field: Secondary | ICD-10-CM | POA: Diagnosis not present

## 2023-01-15 DIAGNOSIS — R911 Solitary pulmonary nodule: Secondary | ICD-10-CM | POA: Diagnosis not present

## 2023-01-20 ENCOUNTER — Telehealth: Payer: Self-pay | Admitting: *Deleted

## 2023-01-20 DIAGNOSIS — R911 Solitary pulmonary nodule: Secondary | ICD-10-CM

## 2023-01-20 DIAGNOSIS — E041 Nontoxic single thyroid nodule: Secondary | ICD-10-CM

## 2023-01-20 NOTE — Telephone Encounter (Signed)
-----   Message from Meriam Sprague, MD sent at 01/17/2023  4:21 PM EDT ----- Deborah [name] scan looks stable and there is no further imaging needed of Deborah [name] chest as long as she does not have a history of smoking or cancer (if so, then repeat in 12 months).  She was incidentally found to have a thyroid nodule which they are recommending a dedicated thyroid ultrasound to evaluate further. We can order this for Deborah [name] but please ensure Deborah [name] PCP follows this up going forward.

## 2023-01-20 NOTE — Telephone Encounter (Signed)
The patient has been notified of the result and verbalized understanding.  All questions (if any) were answered.  Pt stated that she smoked for about 2 years back in her 58s.  Informed the pt that with that information, we will schedule her to have a repeat Chest CT WO Contrast done in one year for surveillance.  She is aware that our scheduling dept will be in contact with her soon to arrange for this appt to be done in one year.  Also informed the pt that noted on her CT was thyroid nodule, and Dr. Shari Prows would like for her to get a dedicated thyroid US to further assess this.  Did advise the pt that after the thyroid US is complete, she will need to have her PCP follow this going forward.  She is aware that I will route this result to her PCP Dr. Lawerance Bach, for further review.   Pt aware that I will order for her to get the thyroid US done and have our [facility] Schedulers give her a call back to arrange this appt.  She said if [facility] does them, she would like to have this done there.  She is aware that I will look and see, and if they do I will order for her [place] to be done at our [facility] location.  Pt verbalized understanding and agrees with this plan.

## 2023-01-21 ENCOUNTER — Ambulatory Visit (HOSPITAL_BASED_OUTPATIENT_CLINIC_OR_DEPARTMENT_OTHER): Payer: Medicare Other

## 2023-01-29 ENCOUNTER — Ambulatory Visit (HOSPITAL_BASED_OUTPATIENT_CLINIC_OR_DEPARTMENT_OTHER)
Admission: RE | Admit: 2023-01-29 | Discharge: 2023-01-29 | Disposition: A | Discharge status: Home / Self Care | Payer: Medicare Other | Source: Ambulatory Visit | Attending: Cardiology | Admitting: Cardiology

## 2023-01-29 DIAGNOSIS — E041 Nontoxic single thyroid nodule: Secondary | ICD-10-CM | POA: Diagnosis not present

## 2023-01-30 ENCOUNTER — Encounter: Payer: Self-pay | Admitting: Internal Medicine

## 2023-01-30 DIAGNOSIS — E041 Nontoxic single thyroid nodule: Secondary | ICD-10-CM

## 2023-02-02 NOTE — Addendum Note (Signed)
Addended by: Pincus Sanes on: 02/02/2023 12:50 PM   Modules accepted: Orders

## 2023-02-05 NOTE — Telephone Encounter (Signed)
The patient had originally requested to see Dr. Gerrit Friends for the referral, but he can't see them as soon as they'd like. They would like to know if the referral can be sent elsewhere. Best callback is [phone].

## 2023-02-06 NOTE — Addendum Note (Signed)
Addended by: Pincus Sanes on: 02/06/2023 04:29 PM   Modules accepted: Orders

## 2023-02-27 DIAGNOSIS — E041 Nontoxic single thyroid nodule: Secondary | ICD-10-CM | POA: Diagnosis not present

## 2023-03-03 ENCOUNTER — Other Ambulatory Visit: Payer: Self-pay | Admitting: Surgery

## 2023-03-03 DIAGNOSIS — E041 Nontoxic single thyroid nodule: Secondary | ICD-10-CM

## 2023-03-05 DIAGNOSIS — H40013 Open angle with borderline findings, low risk, bilateral: Secondary | ICD-10-CM | POA: Diagnosis not present

## 2023-03-05 DIAGNOSIS — Z961 Presence of intraocular lens: Secondary | ICD-10-CM | POA: Diagnosis not present

## 2023-03-18 ENCOUNTER — Ambulatory Visit (INDEPENDENT_AMBULATORY_CARE_PROVIDER_SITE_OTHER): Payer: Medicare Other

## 2023-03-18 VITALS — Ht 63.0 in | Wt 185.0 lb

## 2023-03-18 DIAGNOSIS — Z0000 Encounter for general adult medical examination without abnormal findings: Secondary | ICD-10-CM

## 2023-03-18 NOTE — Progress Notes (Addendum)
I connected with  Lexie Dampier Lindy on 03/18/23 by a audio enabled telemedicine application and verified that I am speaking with the correct person using two identifiers.  Patient Location: Home  Provider Location: Office/Clinic  I discussed the limitations of evaluation and management by telemedicine. The patient expressed understanding and agreed to proceed.  Subjective:   Deborah [name] is a 77 y.o. female who presents for Medicare Annual (Subsequent) preventive examination.  Review of Systems     Cardiac Risk Factors include: advanced age (>54men, >60 women);dyslipidemia;family history of premature cardiovascular disease;obesity (BMI >30kg/m2);hypertension     Objective:    Today's Vitals   03/18/23 1052 03/18/23 1053  Weight: 185 lb (83.9 kg)   Height: 5\' 3"  (1.6 m)   PainSc: 0-No pain 0-No pain   Body mass index is 32.77 kg/m.     03/18/2023   10:59 AM 03/12/2022    9:50 AM 03/09/2021    2:12 PM 01/27/2020    1:41 PM 09/07/2017   11:20 AM  Advanced Directives  Does Patient Have a Medical Advance Directive? Yes Yes Yes Yes No  Type of Estate agent of [place];Living will Living will;Healthcare Power of Attorney Living will;Healthcare Power of [employer] Power of [place];Living will   Does patient want to make changes to medical advance directive?  No - Patient declined No - Patient declined No - Patient declined   Copy of Healthcare Power of Attorney in Chart? No - copy requested No - copy requested No - copy requested No - copy requested   Would patient like information on creating a medical advance directive?     No - Patient declined    Current Medications (verified) Outpatient Encounter Medications as of 03/18/2023  Medication Sig   albuterol (PROVENTIL HFA;VENTOLIN HFA) 108 (90 Base) MCG/ACT inhaler Inhale 2 puffs into the lungs every 4 (four) hours as needed for wheezing.   aspirin EC 81 MG tablet Take 81  mg by mouth daily. Swallow whole.   Cholecalciferol (VITAMIN D3) 1000 units CAPS Take 2,000 Units by mouth daily.    hydrochlorothiazide (HYDRODIURIL) 25 MG tablet TAKE 1 TABLET (25 MG TOTAL) BY MOUTH DAILY.   losartan (COZAAR) 100 MG tablet Take 1 tablet (100 mg total) by mouth daily.   Multiple Vitamins-Minerals (CENTRUM SILVER ULTRA WOMENS PO) Take 1 tablet by mouth.   Nebivolol HCl 20 MG TABS TAKE 1 TABLET BY MOUTH  DAILY   [DISCONTINUED] amoxicillin-clavulanate (AUGMENTIN) 875-125 MG tablet Take 1 tablet by mouth 2 (two) times daily.   [DISCONTINUED] OVER THE COUNTER MEDICATION Take 2 capsules by mouth in the morning. [place]gea-Cal Calcium supplement 2 caps daily.   No facility-administered encounter medications on file as of 03/18/2023.    Allergies (verified) Statins and Propoxyphene n-acetaminophen   History: Past Medical History:  Diagnosis Date   Clotting disorder (HCC)    DVT   DVT (deep venous thrombosis) (HCC)    2-3X   GERD (gastroesophageal reflux disease)    Hyperlipidemia    Hypertension    Ocular migraine    Pneumonia    UTI (urinary tract infection) 10/10/13   E coli 70,000 colonies   Past Surgical History:  Procedure Laterality Date   DVT     2-3 X ; on BCP with first   ENDOVENOUS ABLATION SAPHENOUS VEIN W/ LASER  2003   complicated by DVT    ENDOVENOUS ABLATION SAPHENOUS VEIN W/ LASER  2013   Dr Ardyth Gal   no colonoscopy  SOC reviewed   TONSILLECTOMY     TUBAL LIGATION     G3 P3   WISDOM TOOTH EXTRACTION     Family History  Problem Relation Age of Onset   Arthritis Mother        OA   Melanoma Mother 74       in situ   Transient ischemic attack Mother        late 37s   COPD Mother    Cancer Father        renal cancer   Diabetes Father    Heart attack Father        in 39s   Hypertension Father    Asthma Father    Hypertension Sister    Asthma Sister    Kidney disease Sister        ? from Celebrex   Kidney failure Sister    Breast  cancer Sister 49   Other Sister        Chron's or UC   Stroke Paternal Grandmother        in 46s   Diabetes Paternal Grandmother    Hypertension Paternal Grandmother    Emphysema Paternal Grandfather    Stroke Paternal Grandfather        in 61s   Breast cancer Cousin        early 62's   Diabetes Brother    Colon polyps Neg Hx    Esophageal cancer Neg Hx    Pancreatic cancer Neg Hx    Rectal cancer Neg Hx    Stomach cancer Neg Hx    Social History   Socioeconomic History   Marital status: Married    Spouse name: Not on file   Number of children: 3   Years of education: Not on file   Highest education level: Not on file  Occupational History   Occupation: retired Charity fundraiser  Tobacco Use   Smoking status: Former    Types: Cigarettes    Quit date: 10/14/1969    Years since quitting: 53.4   Smokeless tobacco: Never   Tobacco comments:    Quit at age 72 (smoked x 2 years, up to 1 ppd)   Substance and Sexual Activity   Alcohol use: Yes    Alcohol/week: 2.0 standard drinks of alcohol    Types: 2 Glasses of wine per week    Comment: Red wine (2 glasses weekly or less)    Drug use: No   Sexual activity: Not on file  Other Topics Concern   Not on file  Social History Narrative   Not on file   Social Determinants of Health   Financial Resource Strain: Low Risk  (03/18/2023)   Overall Financial Resource Strain (CARDIA)    Difficulty of Paying Living Expenses: Not hard at all  Food Insecurity: No Food Insecurity (03/18/2023)   Hunger Vital Sign    Worried About Running Out of Food in the Last Year: Never true    Ran Out of Food in the Last Year: Never true  Transportation Needs: No Transportation Needs (03/18/2023)   PRAPARE - Administrator, Civil Service (Medical): No    Lack of Transportation (Non-Medical): No  Physical Activity: Sufficiently Active (03/18/2023)   Exercise Vital Sign    Days of Exercise per Week: 5 days    Minutes of Exercise per Session: 30 min   Stress: No Stress Concern Present (03/18/2023)   [employer] of Occupational Health - Occupational Stress Questionnaire  Feeling of Stress : Not at all  Social Connections: Unknown (03/18/2023)   Social Connection and Isolation Panel [NHANES]    Frequency of Communication with Friends and Family: More than three times a week    Frequency of Social Gatherings with Friends and Family: Once a week    Attends Religious Services: Patient unable to answer    Active Member of Clubs or Organizations: No    Attends Engineer, structural: Patient unable to answer    Marital Status: Married    Tobacco Counseling Counseling given: Not Answered Tobacco comments: Quit at age 89 (smoked x 2 years, up to 1 ppd)    Clinical Intake:  Pre-visit preparation completed: Yes  Pain : No/denies pain Pain Score: 0-No pain     BMI - recorded: 32.77 Nutritional Status: BMI > 30  Obese Nutritional Risks: None Diabetes: No  How often do you need to have someone help you when you read instructions, pamphlets, or other written materials from your doctor or pharmacy?: 1 - Never 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 No  Interpreter Needed?: No  Information entered by :: Curstin Schmale N. Maymuna Detzel, LPN.   Activities of Daily Living    03/18/2023   11:00 AM 03/15/2023    4:12 PM  In your present state of health, do you have any difficulty performing the following activities:  Hearing? 0 0  Vision? 0 0  Difficulty concentrating or making decisions? 0 0  Walking or climbing stairs? 0 0  Dressing or bathing? 0 0  Doing errands, shopping? 0 0  Preparing Food and eating ? N N  Using the Toilet? N N  In the past six months, have you accidently leaked urine? N   Do you have problems with loss of bowel control? N N  Managing your Medications? N N  Managing your Finances? N N  Housekeeping or managing your Housekeeping? N N    Patient Care  Team: Pincus Sanes, MD as PCP - General (Internal Medicine) Lyn Records, MD (Inactive) as PCP - Cardiology (Cardiology)  Indicate any recent Medical Services you may have received from other than [facility] providers in the past year (date may be approximate).     Assessment:   This is a routine wellness examination for Adamariz.  Hearing/Vision screen Hearing Screening - Comments:: Patient denied any hearing difficulty.   No hearing aids.  Vision Screening - Comments:: Patient does wear otc/rx glasses.  Cataracts removed 2023. Eye exam done by: Antony Contras, MD.   Dietary issues and exercise activities discussed: Current Exercise Habits: Home exercise routine, Type of exercise: walking, Time (Minutes): 30, Frequency (Times/Week): 5, Weekly Exercise (Minutes/Week): 150, Intensity: Moderate, Exercise limited by: respiratory conditions(s);orthopedic condition(s)   Goals Addressed             This Visit's Progress    My goal for 2024 is to lose 10 pounds.        Depression Screen    03/18/2023   11:09 AM 03/18/2023   11:00 AM 03/12/2022    9:56 AM 03/09/2021    2:11 PM 01/27/2020    1:43 PM 01/24/2016   11:16 AM 10/25/2013    2:38 PM  PHQ 2/9 Scores  PHQ - 2 Score 0 0 0 0 0 0 0  PHQ- 9 Score 0          Fall Risk    03/18/2023   11:00 AM 03/15/2023  4:12 PM 03/12/2022    9:51 AM 03/09/2021    2:12 PM 01/27/2020    1:42 PM  Fall Risk   Falls in the past year? 0 0 0 0 0  Number falls in past yr: 0 0 0 0 0  Injury with Fall? 0  0 0 0  Comment     twisted her ankle  Risk for fall due to : No Fall Risks  No Fall Risks No Fall Risks No Fall Risks  Follow up Falls prevention discussed  Falls evaluation completed Falls evaluation completed Falls evaluation completed;Education provided    FALL RISK PREVENTION PERTAINING TO THE HOME:  Any stairs in or around the home? Yes  If so, are there any without handrails? No  Home free of loose throw rugs in walkways, pet beds, electrical  cords, etc? Yes  Adequate lighting in your home to reduce risk of falls? Yes   ASSISTIVE DEVICES UTILIZED TO PREVENT FALLS:  Life alert? No  Use of a cane, walker or w/c? No  Grab bars in the bathroom? No  Shower chair or bench in shower? Yes  Elevated toilet seat or a handicapped toilet? No   TIMED UP AND GO:  Was the test performed? No . Telephonic Visit   Cognitive Function:        03/18/2023   11:02 AM 03/12/2022   10:06 AM 01/27/2020    1:51 PM  6CIT Screen  What Year? 0 points 0 points 0 points  What month? 0 points 0 points 0 points  What time? 0 points 0 points 0 points  Count back from 20 0 points 0 points 0 points  Months in reverse 0 points 0 points 0 points  Repeat phrase 0 points 0 points 0 points  Total Score 0 points 0 points 0 points    Immunizations Immunization History  Administered Date(s) Administered   Fluad Quad(high Dose 65+) 06/21/2021, 09/21/2022   Influenza Split 07/29/2012   Influenza Whole 07/19/2008   Influenza, High Dose Seasonal PF 06/26/2017, 08/02/2018, 06/26/2019, 06/09/2020   Influenza,inj,Quad PF,6+ Mos 08/10/2013   Influenza-Unspecified 07/31/2014, 08/11/2015, 06/09/2020   Moderna Covid-19 Vaccine Bivalent Booster 58yrs & up 07/09/2021   Moderna SARS-COV2 Booster Vaccination 08/04/2020   Moderna Sars-Covid-2 Vaccination 11/15/2019, 12/24/2019   PFIZER Comirnaty(Gray Top)Covid-19 Tri-Sucrose Vaccine 03/26/2021   Pfizer Covid-19 Vaccine Bivalent Booster 84yrs & up 06/18/2022   Pneumococcal Conjugate-13 10/20/2015   Pneumococcal Polysaccharide-23 10/25/2013   Respiratory Syncytial Virus Vaccine,Recomb Aduvanted(Arexvy) 11/19/2022   Tdap 04/02/2011, 11/27/2021    TDAP status: Up to date  Flu Vaccine status: Up to date  Pneumococcal vaccine status: Up to date  Covid-19 vaccine status: Completed vaccines  Qualifies for Shingles Vaccine? Yes   Zostavax completed No   Shingrix Completed?: No.    Education has been provided  regarding the importance of this vaccine. Patient has been advised to call insurance company to determine out of pocket expense if they have not yet received this vaccine. Advised may also receive vaccine at local pharmacy or Health Dept. Verbalized acceptance and understanding.  Screening Tests Health Maintenance  Topic Date Due   Zoster Vaccines- Shingrix (1 of 2) Never done   COVID-19 Vaccine (7 - 2023-24 season) 08/13/2022   INFLUENZA VACCINE  05/15/2023   Medicare Annual Wellness (AWV)  03/17/2024   DEXA SCAN  10/17/2024   Colonoscopy  03/05/2026   DTaP/Tdap/Td (3 - Td or Tdap) 11/28/2031   Pneumonia Vaccine 13+ Years old  Completed   Hepatitis  C Screening  Completed   HPV VACCINES  Aged Out    Health Maintenance  Health Maintenance Due  Topic Date Due   Zoster Vaccines- Shingrix (1 of 2) Never done   COVID-19 Vaccine (7 - 2023-24 season) 08/13/2022    Colorectal cancer screening: Type of screening: Colonoscopy. Completed 03/05/2016. Repeat every 10 years  Mammogram status: Completed 12/02/2022. Repeat every year  Bone Density status: Completed 10/17/2021. Results reflect: Bone density results: OSTEOPENIA. Repeat every 3 years.  Lung Cancer Screening: (Low Dose CT Chest recommended if Age 68-80 years, 30 pack-year currently smoking OR have quit w/in 15years.) does not qualify.   Lung Cancer Screening Referral: no  Additional Screening:  Hepatitis C Screening: does qualify; Completed 11/20/2016  Vision Screening: Recommended annual ophthalmology exams for early detection of glaucoma and other disorders of the eye. Is the patient up to date with their annual eye exam?  Yes  Who is the provider or what is the name of the office in which the patient attends annual eye exams? Antony Contras, MD. If pt is not established with a provider, would they like to be referred to a provider to establish care? No .   Dental Screening: Recommended annual dental exams for proper oral  hygiene  Community Resource Referral / Chronic Care Management: CRR required this visit?  No   CCM required this visit?  No      Plan:     I have personally reviewed and noted the following in the patient's chart:   Medical and social history Use of alcohol, tobacco or illicit drugs  Current medications and supplements including opioid prescriptions. Patient is not currently taking opioid prescriptions. Functional ability and status Nutritional status Physical activity Advanced directives List of other physicians Hospitalizations, surgeries, and ER visits in previous 12 months Vitals Screenings to include cognitive, depression, and falls Referrals and appointments  In addition, I have reviewed and discussed with patient certain preventive protocols, quality metrics, and best practice recommendations. A written personalized care plan for preventive services as well as general preventive health recommendations were provided to patient.     Mickeal Needy, LPN   11/14/3084   Nurse Notes: Normal cognitive status assessed by direct observation via telephone conversation by this Nurse Health Advisor. No abnormalities found.

## 2023-03-18 NOTE — Patient Instructions (Addendum)
Deborah [name] , Thank you for taking time to come for your Medicare Wellness Visit. I appreciate your ongoing commitment to your health goals. Please review the following plan we discussed and let me know if I can assist you in the future.   These are the goals we discussed:  Goals      My goal for 2024 is to lose 10 pounds.        This is a list of the screening recommended for you and due dates:  Health Maintenance  Topic Date Due   Zoster (Shingles) Vaccine (1 of 2) Never done   COVID-19 Vaccine (7 - 2023-24 season) 08/13/2022   Flu Shot  05/15/2023   Medicare Annual Wellness Visit  03/17/2024   DEXA scan (bone density measurement)  10/17/2024   Colon Cancer Screening  03/05/2026   DTaP/Tdap/Td vaccine (3 - Td or Tdap) 11/28/2031   Pneumonia Vaccine  Completed   Hepatitis C Screening  Completed   HPV Vaccine  Aged Out    Advanced directives: Yes  Conditions/risks identified: Yes  Next appointment: Follow up in one year for your annual wellness visit.   Preventive Care 28 Years and Older, Female Preventive care refers to lifestyle choices and visits with your health care provider that can promote health and wellness. What does preventive care include? A yearly physical exam. This is also called an annual well check. Dental exams once or twice a year. Routine eye exams. Ask your health care provider how often you should have your eyes checked. Personal lifestyle choices, including: Daily care of your teeth and gums. Regular physical activity. Eating a healthy diet. Avoiding tobacco and drug use. Limiting alcohol use. Practicing safe sex. Taking low-dose aspirin every day. Taking vitamin and mineral supplements as recommended by your health care provider. What happens during an annual well check? The services and screenings done by your health care provider during your annual well check will depend on your age, overall health, lifestyle risk factors, and family  history of disease. Counseling  Your health care provider may ask you questions about your: Alcohol use. Tobacco use. Drug use. Emotional well-being. Home and relationship well-being. Sexual activity. Eating habits. History of falls. Memory and ability to understand (cognition). Work and work Astronomer. Reproductive health. Screening  You may have the following tests or measurements: Height, weight, and BMI. Blood pressure. Lipid and cholesterol levels. These may be checked every 5 years, or more frequently if you are over 55 years old. Skin check. Lung cancer screening. You may have this screening every year starting at age 85 if you have a 30-pack-year history of smoking and currently smoke or have quit within the past 15 years. Fecal occult blood test (FOBT) of the stool. You may have this test every year starting at age 78. Flexible sigmoidoscopy or colonoscopy. You may have a sigmoidoscopy every 5 years or a colonoscopy every 10 years starting at age 34. Hepatitis C blood test. Hepatitis B blood test. Sexually transmitted disease (STD) testing. Diabetes screening. This is done by checking your blood sugar (glucose) after you have not eaten for a while (fasting). You may have this done every 1-3 years. Bone density scan. This is done to screen for osteoporosis. You may have this done starting at age 45. Mammogram. This may be done every 1-2 years. Talk to your health care provider about how often you should have regular mammograms. Talk with your health care provider about your test results, treatment options, and if  necessary, the need for more tests. Vaccines  Your health care provider may recommend certain vaccines, such as: Influenza vaccine. This is recommended every year. Tetanus, diphtheria, and acellular pertussis (Tdap, Td) vaccine. You may need a Td booster every 10 years. Zoster vaccine. You may need this after age 5. Pneumococcal 13-valent conjugate (PCV13)  vaccine. One dose is recommended after age 69. Pneumococcal polysaccharide (PPSV23) vaccine. One dose is recommended after age 20. Talk to your health care provider about which screenings and vaccines you need and how often you need them. This information is not intended to replace advice given to you by your health care provider. Make sure you discuss any questions you have with your health care provider. Document Released: 10/27/2015 Document Revised: 06/19/2016 Document Reviewed: 08/01/2015 Elsevier Interactive Patient Education  2017 [employer].  Fall Prevention in the Home Falls can cause injuries. They can happen to people of all ages. There are many things you can do to make your home safe and to help prevent falls. What can I do on the outside of my home? Regularly fix the edges of walkways and driveways and fix any cracks. Remove anything that might make you trip as you walk through a door, such as a raised step or threshold. Trim any bushes or trees on the path to your home. Use bright outdoor lighting. Clear any walking paths of anything that might make someone trip, such as rocks or tools. Regularly check to see if handrails are loose or broken. Make sure that both sides of any steps have handrails. Any raised decks and porches should have guardrails on the edges. Have any leaves, snow, or ice cleared regularly. Use sand or salt on walking paths during winter. Clean up any spills in your garage right away. This includes oil or grease spills. What can I do in the bathroom? Use night lights. Install grab bars by the toilet and in the tub and shower. Do not use towel bars as grab bars. Use non-skid mats or decals in the tub or shower. If you need to sit down in the shower, use a plastic, non-slip stool. Keep the floor dry. Clean up any water that spills on the floor as soon as it happens. Remove soap buildup in the tub or shower regularly. Attach bath mats securely with  double-sided non-slip rug tape. Do not have throw rugs and other things on the floor that can make you trip. What can I do in the bedroom? Use night lights. Make sure that you have a light by your bed that is easy to reach. Do not use any sheets or blankets that are too big for your bed. They should not hang down onto the floor. Have a firm chair that has side arms. You can use this for support while you get dressed. Do not have throw rugs and other things on the floor that can make you trip. What can I do in the kitchen? Clean up any spills right away. Avoid walking on wet floors. Keep items that you use a lot in easy-to-reach places. If you need to reach something above you, use a strong step stool that has a grab bar. Keep electrical cords out of the way. Do not use floor polish or wax that makes floors slippery. If you must use wax, use non-skid floor wax. Do not have throw rugs and other things on the floor that can make you trip. What can I do with my stairs? Do not leave any items  on the stairs. Make sure that there are handrails on both sides of the stairs and use them. Fix handrails that are broken or loose. Make sure that handrails are as long as the stairways. Check any carpeting to make sure that it is firmly attached to the stairs. Fix any carpet that is loose or worn. Avoid having throw rugs at the top or bottom of the stairs. If you do have throw rugs, attach them to the floor with carpet tape. Make sure that you have a light switch at the top of the stairs and the bottom of the stairs. If you do not have them, ask someone to add them for you. What else can I do to help prevent falls? Wear shoes that: Do not have high heels. Have rubber bottoms. Are comfortable and fit you well. Are closed at the toe. Do not wear sandals. If you use a stepladder: Make sure that it is fully opened. Do not climb a closed stepladder. Make sure that both sides of the stepladder are locked  into place. Ask someone to hold it for you, if possible. Clearly mark and make sure that you can see: Any grab bars or handrails. First and last steps. Where the edge of each step is. Use tools that help you move around (mobility aids) if they are needed. These include: Canes. Walkers. Scooters. Crutches. Turn on the lights when you go into a dark area. Replace any light bulbs as soon as they burn out. Set up your furniture so you have a clear path. Avoid moving your furniture around. If any of your floors are uneven, fix them. If there are any pets around you, be aware of where they are. Review your medicines with your doctor. Some medicines can make you feel dizzy. This can increase your chance of falling. Ask your doctor what other things that you can do to help prevent falls. This information is not intended to replace advice given to you by your health care provider. Make sure you discuss any questions you have with your health care provider. Document Released: 07/27/2009 Document Revised: 03/07/2016 Document Reviewed: 11/04/2014 Elsevier Interactive Patient Education  2017 [employer].

## 2023-03-19 ENCOUNTER — Other Ambulatory Visit: Payer: Self-pay

## 2023-03-19 MED ORDER — NEBIVOLOL HCL 20 MG PO TABS: 1.0000 | ORAL_TABLET | Freq: Every day | ORAL | 3 refills | Status: DC

## 2023-04-02 ENCOUNTER — Ambulatory Visit
Admission: RE | Admit: 2023-04-02 | Discharge: 2023-04-02 | Disposition: A | Discharge status: Home / Self Care | Payer: Medicare Other | Source: Ambulatory Visit | Attending: Surgery | Admitting: Surgery

## 2023-04-02 ENCOUNTER — Other Ambulatory Visit (HOSPITAL_COMMUNITY)
Admission: RE | Admit: 2023-04-02 | Discharge: 2023-04-02 | Disposition: A | Discharge status: Home / Self Care | Payer: Medicare Other | Source: Ambulatory Visit | Attending: Surgery | Admitting: Surgery

## 2023-04-02 DIAGNOSIS — E041 Nontoxic single thyroid nodule: Secondary | ICD-10-CM | POA: Insufficient documentation

## 2023-04-04 LAB — CYTOLOGY - NON PAP

## 2023-05-28 ENCOUNTER — Encounter: Payer: Medicare Other | Admitting: Physician Assistant

## 2023-05-28 NOTE — Progress Notes (Signed)
Appointment canceled after time of visit by staff at patient PCP office.

## 2023-05-28 NOTE — Progress Notes (Unsigned)
    Subjective:    Patient ID: Deborah [name], female    DOB: 1946-05-14, 77 y.o.   MRN: [id number]      HPI Deborah [name] is here for No chief complaint on file.    ? UTI:  Her symptoms started  *** days ago.  She states dysuria, urinary frequency, urinary urgency, hematuria, abdominal pain, back pain, nausea, fever.  She denies other symptoms.     No bactrim --- can do keflex   Medications and allergies reviewed with patient and updated if appropriate.  Current Outpatient Medications on File Prior to Visit  Medication Sig Dispense Refill   albuterol (PROVENTIL HFA;VENTOLIN HFA) 108 (90 Base) MCG/ACT inhaler Inhale 2 puffs into the lungs every 4 (four) hours as needed for wheezing. 1 Inhaler 2   aspirin EC 81 MG tablet Take 81 mg by mouth daily. Swallow whole.     Cholecalciferol (VITAMIN D3) 1000 units CAPS Take 2,000 Units by mouth daily.      hydrochlorothiazide (HYDRODIURIL) 25 MG tablet TAKE 1 TABLET (25 MG TOTAL) BY MOUTH DAILY. 90 tablet 3   losartan (COZAAR) 100 MG tablet Take 1 tablet (100 mg total) by mouth daily. 90 tablet 3   Multiple Vitamins-Minerals (CENTRUM SILVER ULTRA WOMENS PO) Take 1 tablet by mouth.     Nebivolol HCl 20 MG TABS Take 1 tablet (20 mg total) by mouth daily. 90 tablet 3   No current facility-administered medications on file prior to visit.    Review of Systems     Objective:  There were no vitals filed for this visit. BP Readings from Last 3 Encounters:  11/27/22 (!) 148/78  09/16/22 138/82  08/02/22 138/74   Wt Readings from Last 3 Encounters:  03/18/23 185 lb (83.9 kg)  11/27/22 181 lb 9.6 oz (82.4 kg)  09/16/22 180 lb (81.6 kg)   There is no height or weight on file to calculate BMI.    Physical Exam Constitutional:      General: She is not in acute distress.    Appearance: Normal appearance. She is not ill-appearing.  HENT:     Head: Normocephalic.  Eyes:     Conjunctiva/sclera: Conjunctivae normal.   Abdominal:     General: There is no distension.     Palpations: Abdomen is soft.     Tenderness: There is no abdominal tenderness. There is no right CVA tenderness, left CVA tenderness, guarding or rebound.  Skin:    General: Skin is warm and dry.  Neurological:     Mental Status: She is alert.            Assessment & Plan:    See Problem List for Assessment and Plan of chronic medical problems.

## 2023-05-29 ENCOUNTER — Encounter: Payer: Self-pay | Admitting: Internal Medicine

## 2023-05-29 ENCOUNTER — Ambulatory Visit (INDEPENDENT_AMBULATORY_CARE_PROVIDER_SITE_OTHER): Payer: Medicare Other | Admitting: Internal Medicine

## 2023-05-29 VITALS — BP 146/80 | HR 61 | Temp 98.2°F | Ht 63.0 in | Wt 183.8 lb

## 2023-05-29 DIAGNOSIS — N3000 Acute cystitis without hematuria: Secondary | ICD-10-CM

## 2023-05-29 DIAGNOSIS — I10 Essential (primary) hypertension: Secondary | ICD-10-CM | POA: Diagnosis not present

## 2023-05-29 DIAGNOSIS — R350 Frequency of micturition: Secondary | ICD-10-CM | POA: Diagnosis not present

## 2023-05-29 LAB — POCT URINALYSIS DIPSTICK
Bilirubin, UA: NEGATIVE
Blood, UA: NEGATIVE
Glucose, UA: NEGATIVE
Ketones, UA: NEGATIVE
Leukocytes, UA: NEGATIVE
Nitrite, UA: NEGATIVE
Protein, UA: POSITIVE — AB
Spec Grav, UA: 1.015 (ref 1.010–1.025)
Urobilinogen, UA: 0.2 E.U./dL
pH, UA: 7 (ref 5.0–8.0)

## 2023-05-29 MED ORDER — CEPHALEXIN 500 MG PO CAPS: 500.0000 mg | ORAL_CAPSULE | Freq: Two times a day (BID) | ORAL | 0 refills | Status: DC

## 2023-05-29 NOTE — Assessment & Plan Note (Signed)
Acute Urine dip not too convincing of a UTI, but her symptoms are concerning for UTI so we will go ahead and start an antibiotic Send urine for culture-depending on results we will either discontinue antibiotic or she will complete the entire course Keflex 500 mg twice daily x 1 week Continue increased fluids Call if no improvement/concerns

## 2023-05-29 NOTE — Assessment & Plan Note (Signed)
Chronic Blood pressure elevated here today She is due for a physical and will schedule an appointment She will monitor her BP at home No change of medications today Continue nebivolol 20 mg daily, losartan 100 mg daily, hydrochlorothiazide 25 mg daily

## 2023-05-29 NOTE — Patient Instructions (Signed)
Take the antibiotic as prescribed.  Take tylenol if needed.     Increase your water intake.   Call if no improvement     Urinary Tract Infection, Adult A urinary tract infection (UTI) is an infection of any part of the urinary tract, which includes the kidneys, ureters, bladder, and urethra. These organs make, store, and get rid of urine in the body. UTI can be a bladder infection (cystitis) or kidney infection (pyelonephritis). What are the causes? This infection may be caused by fungi, viruses, or bacteria. Bacteria are the most common cause of UTIs. This condition can also be caused by repeated incomplete emptying of the bladder during urination. What increases the risk? This condition is more likely to develop if:  You ignore your need to urinate or hold urine for long periods of time.  You do not empty your bladder completely during urination.  You wipe back to front after urinating or having a bowel movement, if you are female.  You are uncircumcised, if you are female.  You are constipated.  You have a urinary catheter that stays in place (indwelling).  You have a weak defense (immune) system.  You have a medical condition that affects your bowels, kidneys, or bladder.  You have diabetes.  You take antibiotic medicines frequently or for long periods of time, and the antibiotics no longer work well against certain types of infections (antibiotic resistance).  You take medicines that irritate your urinary tract.  You are exposed to chemicals that irritate your urinary tract.  You are female.  What are the signs or symptoms? Symptoms of this condition include:  Fever.  Frequent urination or passing small amounts of urine frequently.  Needing to urinate urgently.  Pain or burning with urination.  Urine that smells bad or unusual.  Cloudy urine.  Pain in the lower abdomen or back.  Trouble urinating.  Blood in the urine.  Vomiting or being less hungry than  normal.  Diarrhea or abdominal pain.  Vaginal discharge, if you are female.  How is this diagnosed? This condition is diagnosed with a medical history and physical exam. You will also need to provide a urine sample to test your urine. Other tests may be done, including:  Blood tests.  Sexually transmitted disease (STD) testing.  If you have had more than one UTI, a cystoscopy or imaging studies may be done to determine the cause of the infections. How is this treated? Treatment for this condition often includes a combination of two or more of the following:  Antibiotic medicine.  Other medicines to treat less common causes of UTI.  Over-the-counter medicines to treat pain.  Drinking enough water to stay hydrated.  Follow these instructions at home:  Take over-the-counter and prescription medicines only as told by your health care provider.  If you were prescribed an antibiotic, take it as told by your health care provider. Do not stop taking the antibiotic even if you start to feel better.  Avoid alcohol, caffeine, tea, and carbonated beverages. They can irritate your bladder.  Drink enough fluid to keep your urine clear or pale yellow.  Keep all follow-up visits as told by your health care provider. This is important.  Make sure to: ? Empty your bladder often and completely. Do not hold urine for long periods of time. ? Empty your bladder before and after sex. ? Wipe from front to back after a bowel movement if you are female. Use each tissue one time when you   wipe. Contact a health care provider if:  You have back pain.  You have a fever.  You feel nauseous or vomit.  Your symptoms do not get better after 3 days.  Your symptoms go away and then return. Get help right away if:  You have severe back pain or lower abdominal pain.  You are vomiting and cannot keep down any medicines or water. This information is not intended to replace advice given to you by  your health care provider. Make sure you discuss any questions you have with your health care provider. Document Released: 07/10/2005 Document Revised: 03/13/2016 Document Reviewed: 08/21/2015 Elsevier Interactive Patient Education  2018 Elsevier Inc.   

## 2023-05-31 LAB — CULTURE, URINE COMPREHENSIVE

## 2023-07-16 ENCOUNTER — Other Ambulatory Visit (HOSPITAL_BASED_OUTPATIENT_CLINIC_OR_DEPARTMENT_OTHER): Payer: Self-pay | Admitting: *Deleted

## 2023-07-16 MED ORDER — HYDROCHLOROTHIAZIDE 25 MG PO TABS: 25.0000 mg | ORAL_TABLET | Freq: Every day | ORAL | 0 refills | Status: DC

## 2023-08-01 ENCOUNTER — Encounter: Payer: Self-pay | Admitting: Internal Medicine

## 2023-08-05 ENCOUNTER — Other Ambulatory Visit: Payer: Self-pay

## 2023-08-05 DIAGNOSIS — Z1231 Encounter for screening mammogram for malignant neoplasm of breast: Secondary | ICD-10-CM

## 2023-08-06 ENCOUNTER — Other Ambulatory Visit: Payer: Self-pay | Admitting: Internal Medicine

## 2023-08-06 DIAGNOSIS — Z1231 Encounter for screening mammogram for malignant neoplasm of breast: Secondary | ICD-10-CM

## 2023-08-19 ENCOUNTER — Other Ambulatory Visit: Payer: Self-pay

## 2023-08-19 MED ORDER — LOSARTAN POTASSIUM 100 MG PO TABS: 100.0000 mg | ORAL_TABLET | Freq: Every day | ORAL | 0 refills | Status: DC

## 2023-08-21 ENCOUNTER — Other Ambulatory Visit: Payer: Self-pay | Admitting: Internal Medicine

## 2023-08-21 DIAGNOSIS — Z1231 Encounter for screening mammogram for malignant neoplasm of breast: Secondary | ICD-10-CM

## 2023-10-17 ENCOUNTER — Other Ambulatory Visit: Payer: Self-pay | Admitting: Cardiovascular Disease

## 2023-10-18 ENCOUNTER — Other Ambulatory Visit (HOSPITAL_BASED_OUTPATIENT_CLINIC_OR_DEPARTMENT_OTHER): Payer: Self-pay | Admitting: Cardiology

## 2023-10-21 ENCOUNTER — Ambulatory Visit: Payer: HMO | Attending: Cardiovascular Disease | Admitting: Cardiovascular Disease

## 2023-10-21 ENCOUNTER — Encounter: Payer: Self-pay | Admitting: Cardiovascular Disease

## 2023-10-21 VITALS — BP 150/78 | HR 60 | Ht 62.5 in | Wt 185.0 lb

## 2023-10-21 DIAGNOSIS — I10 Essential (primary) hypertension: Secondary | ICD-10-CM | POA: Diagnosis not present

## 2023-10-21 DIAGNOSIS — I471 Supraventricular tachycardia, unspecified: Secondary | ICD-10-CM | POA: Diagnosis not present

## 2023-10-21 DIAGNOSIS — E7849 Other hyperlipidemia: Secondary | ICD-10-CM

## 2023-10-21 MED ORDER — NEBIVOLOL HCL 20 MG PO TABS: 1.0000 | ORAL_TABLET | Freq: Every day | ORAL | 3 refills | Status: DC

## 2023-10-21 MED ORDER — HYDROCHLOROTHIAZIDE 25 MG PO TABS: 25.0000 mg | ORAL_TABLET | Freq: Every day | ORAL | 3 refills | Status: DC

## 2023-10-21 MED ORDER — LOSARTAN POTASSIUM 100 MG PO TABS: 100.0000 mg | ORAL_TABLET | Freq: Every day | ORAL | 3 refills | Status: DC

## 2023-10-21 NOTE — Progress Notes (Signed)
 10/21/2023 Deborah [name] [facility]   09-Feb-1946  [id number]  Primary Physician Geofm, Glade PARAS, MD Primary Cardiologist: Deborah PARAS Lesches MD Deborah [name], FSCAI  HPI:  Deborah [name] is a 78 y.o. mildly overweight married Caucasian female mother of 3 daughters, grandmother of 7 grandchildren who is accompanied by her husband Deborah [name] today.  She was previously a patient of Dr. Claudene (currently retired), then Dr. Hobart and now is transferring to me.  She is a retired Scientist, Forensic.  She treated at [facility]  and [facility].  Her risk factors include treated hypertension, untreated mild hyperlipidemia and family history of the father who had a myocardial infarction.  She is never had a heart attack or stroke.  She denies chest pain or shortness of breath.  She is fairly active.  There is a remote history of DVT greater than 10 years ago.  She does have a history of palpitations/SVT currently controlled on Bystolic .   Current Meds  Medication Sig   albuterol  (PROVENTIL  HFA;VENTOLIN  HFA) 108 (90 Base) MCG/ACT inhaler Inhale 2 puffs into the lungs every 4 (four) hours as needed for wheezing.   aspirin EC 81 MG tablet Take 81 mg by mouth daily. Swallow whole.   hydrochlorothiazide  (HYDRODIURIL ) 25 MG tablet TAKE 1 TABLET (25 MG TOTAL) BY MOUTH DAILY.   losartan  (COZAAR ) 100 MG tablet Take 1 tablet (100 mg total) by mouth daily.   Multiple Vitamins-Minerals (CENTRUM SILVER ULTRA WOMENS PO) Take 1 tablet by mouth.   Nebivolol  HCl 20 MG TABS Take 1 tablet (20 mg total) by mouth daily.   OVER THE COUNTER MEDICATION daily. Papaya and pineapple.     Allergies  Allergen Reactions   Statins     Muscle aches with some, elevated lft with crestor    Propoxyphene N-Acetaminophen      REACTION: GI Upset    Social History   Socioeconomic History   Marital status: Married    Spouse name: Not on file   Number of children: 3   Years of education: Not on file    Highest education level: Not on file  Occupational History   Occupation: retired [employer]  Tobacco Use   Smoking status: Former    Current packs/day: 0.00    Types: Cigarettes    Quit date: 10/14/1969    Years since quitting: 54.0   Smokeless tobacco: Never   Tobacco comments:    Quit at age 39 (smoked x 2 years, up to 1 ppd)   Substance and Sexual Activity   Alcohol use: Yes    Alcohol/week: 2.0 standard drinks of alcohol    Types: 2 Glasses of wine per week    Comment: Red wine (2 glasses weekly or less)    Drug use: No   Sexual activity: Not on file  Other Topics Concern   Not on file  Social History Narrative   Not on file   Social Drivers of Health   Financial Resource Strain: Low Risk  (03/18/2023)   Overall Financial Resource Strain (CARDIA)    Difficulty of Paying Living Expenses: Not hard at all  Food Insecurity: No Food Insecurity (03/18/2023)   Hunger Vital Sign    Worried About Running Out of Food in the Last Year: Never true    Ran Out of Food in the Last Year: Never true  Transportation Needs: No Transportation Needs (03/18/2023)   PRAPARE - Administrator, Civil Service (Medical): No    Lack of Transportation (  Non-Medical): No  Physical Activity: Sufficiently Active (03/18/2023)   Exercise Vital Sign    Days of Exercise per Week: 5 days    Minutes of Exercise per Session: 30 min  Stress: No Stress Concern Present (03/18/2023)   [employer] of Occupational Health - Occupational Stress Questionnaire    Feeling of Stress : Not at all  Social Connections: Unknown (03/18/2023)   Social Connection and Isolation Panel [NHANES]    Frequency of Communication with Friends and Family: More than three times a week    Frequency of Social Gatherings with Friends and Family: Once a week    Attends Religious Services: Patient unable to answer    Active Member of Clubs or Organizations: No    Attends Banker Meetings: Patient unable to answer    Marital  Status: Married  Catering Manager Violence: Not At Risk (03/18/2023)   Humiliation, Afraid, Rape, and Kick questionnaire    Fear of Current or Ex-Partner: No    Emotionally Abused: No    Physically Abused: No    Sexually Abused: No     Review of Systems: General: negative for chills, fever, night sweats or weight changes.  Cardiovascular: negative for chest pain, dyspnea on exertion, edema, orthopnea, palpitations, paroxysmal nocturnal dyspnea or shortness of breath Dermatological: negative for rash Respiratory: negative for cough or wheezing Urologic: negative for hematuria Abdominal: negative for nausea, vomiting, diarrhea, bright red blood per rectum, melena, or hematemesis Neurologic: negative for visual changes, syncope, or dizziness All other systems reviewed and are otherwise negative except as noted above.    Blood pressure (!) 150/78, pulse 60, height 5' 2.5 (1.588 m), weight 185 lb (83.9 kg), SpO2 98%.  General appearance: alert and no distress Neck: no adenopathy, no carotid bruit, no JVD, supple, symmetrical, trachea midline, and thyroid  not enlarged, symmetric, no tenderness/mass/nodules Lungs: clear to auscultation bilaterally Heart: regular rate and rhythm, S1, S2 normal, no murmur, click, rub or gallop Extremities: extremities normal, atraumatic, no cyanosis or edema Pulses: 2+ and symmetric Skin: Skin color, texture, turgor normal. No rashes or lesions Neurologic: Grossly normal  EKG EKG Interpretation Date/Time:  Tuesday October 21 2023 09:17:33 EST Ventricular Rate:  60 PR Interval:  194 QRS Duration:  74 QT Interval:  406 QTC Calculation: 406 R Axis:   -11  Text Interpretation: Normal sinus rhythm Cannot rule out Anterior infarct , age undetermined No previous ECGs available Confirmed by Court Carrier [phone]) on 10/21/2023 9:26:14 AM    ASSESSMENT AND PLAN:   Hyperlipidemia History of mild hyperlipidemia not on statin therapy (statin intolerant lipid  profile performed 02/15/2022 revealing total cholesterol 189, LDL 111 and HDL 54.  She did have a coronary calcium  score performed 01/07/2023 which was 0.  Essential hypertension History of essential hypertension with blood pressure measured today at 150/78.  She is on hydrochlorothiazide , losartan  and Bystolic .  SVT (supraventricular tachycardia) (HCC) - Dr Victory Sharps History of palpitations on Bystolic .  The symptoms are currently quiescent.     Carrier DOROTHA Court MD FACP,FACC,FAHA, [facility] 10/21/2023 9:52 AM

## 2023-10-21 NOTE — Assessment & Plan Note (Signed)
 History of palpitations on Bystolic.  The symptoms are currently quiescent.

## 2023-10-21 NOTE — Patient Instructions (Signed)

## 2023-10-21 NOTE — Addendum Note (Signed)
 Addended by: Bernita Buffy on: 10/21/2023 10:03 AM   Modules accepted: Orders

## 2023-10-21 NOTE — Assessment & Plan Note (Signed)
 History of mild hyperlipidemia not on statin therapy (statin intolerant lipid profile performed 02/15/2022 revealing total cholesterol 189, LDL 111 and HDL 54.  She did have a coronary calcium score performed 01/07/2023 which was 0.

## 2023-10-21 NOTE — Assessment & Plan Note (Signed)
 History of essential hypertension with blood pressure measured today at 150/78.  She is on hydrochlorothiazide, losartan and Bystolic.

## 2023-12-05 ENCOUNTER — Ambulatory Visit
Admission: RE | Admit: 2023-12-05 | Discharge: 2023-12-05 | Disposition: A | Discharge status: Home / Self Care | Payer: HMO | Source: Ambulatory Visit | Attending: Internal Medicine | Admitting: Internal Medicine

## 2023-12-05 DIAGNOSIS — Z1231 Encounter for screening mammogram for malignant neoplasm of breast: Secondary | ICD-10-CM | POA: Diagnosis not present

## 2023-12-10 ENCOUNTER — Other Ambulatory Visit: Payer: Self-pay | Admitting: Internal Medicine

## 2023-12-10 DIAGNOSIS — R928 Other abnormal and inconclusive findings on diagnostic imaging of breast: Secondary | ICD-10-CM

## 2023-12-23 ENCOUNTER — Ambulatory Visit
Admission: RE | Admit: 2023-12-23 | Discharge: 2023-12-23 | Disposition: A | Discharge status: Home / Self Care | Payer: HMO | Source: Ambulatory Visit | Attending: Internal Medicine

## 2023-12-23 ENCOUNTER — Ambulatory Visit
Admission: RE | Admit: 2023-12-23 | Discharge: 2023-12-23 | Disposition: A | Discharge status: Home / Self Care | Payer: HMO | Source: Ambulatory Visit | Attending: Internal Medicine | Admitting: Internal Medicine

## 2023-12-23 DIAGNOSIS — R928 Other abnormal and inconclusive findings on diagnostic imaging of breast: Secondary | ICD-10-CM

## 2023-12-23 DIAGNOSIS — N6002 Solitary cyst of left breast: Secondary | ICD-10-CM | POA: Diagnosis not present

## 2024-01-15 DIAGNOSIS — D225 Melanocytic nevi of trunk: Secondary | ICD-10-CM | POA: Diagnosis not present

## 2024-01-15 DIAGNOSIS — L821 Other seborrheic keratosis: Secondary | ICD-10-CM | POA: Diagnosis not present

## 2024-01-15 DIAGNOSIS — L718 Other rosacea: Secondary | ICD-10-CM | POA: Diagnosis not present

## 2024-01-15 DIAGNOSIS — L814 Other melanin hyperpigmentation: Secondary | ICD-10-CM | POA: Diagnosis not present

## 2024-03-10 DIAGNOSIS — H40013 Open angle with borderline findings, low risk, bilateral: Secondary | ICD-10-CM | POA: Diagnosis not present

## 2024-03-10 DIAGNOSIS — Z961 Presence of intraocular lens: Secondary | ICD-10-CM | POA: Diagnosis not present

## 2024-03-10 DIAGNOSIS — H52203 Unspecified astigmatism, bilateral: Secondary | ICD-10-CM | POA: Diagnosis not present

## 2024-03-18 ENCOUNTER — Ambulatory Visit: Payer: Medicare Other

## 2024-03-18 VITALS — Ht 62.5 in | Wt 184.0 lb

## 2024-03-18 DIAGNOSIS — Z0000 Encounter for general adult medical examination without abnormal findings: Secondary | ICD-10-CM | POA: Diagnosis not present

## 2024-03-18 NOTE — Patient Instructions (Signed)
 Deborah [name] , Thank you for taking time out of your busy schedule to complete your Annual Wellness Visit with me. I enjoyed our conversation and look forward to speaking with you again next year. I, as well as your care team,  appreciate your ongoing commitment to your health goals. Please review the following plan we discussed and let me know if I can assist you in the future. Your Game plan/ To Do List    Follow up Visits: Next Medicare AWV with our clinical staff: 03/23/2025.   Have you seen your provider in the last 6 months (3 months if uncontrolled diabetes)? No Next Office Visit with your provider: 03/29/2024.  Clinician Recommendations:  Aim for 30 minutes of exercise or brisk walking, 6-8 glasses of water, and 5 servings of fruits and vegetables each day. You are due for a Shingles vaccine.  Keep up the good work.      This is a list of the screening recommended for you and due dates:  Health Maintenance  Topic Date Due   Zoster (Shingles) Vaccine (1 of 2) Never done   COVID-19 Vaccine (7 - 2024-25 season) 06/15/2023   Medicare Annual Wellness Visit  03/17/2024   Flu Shot  05/14/2024   DEXA scan (bone density measurement)  10/17/2024   DTaP/Tdap/Td vaccine (3 - Td or Tdap) 11/28/2031   Pneumonia Vaccine  Completed   Hepatitis C Screening  Completed   HPV Vaccine  Aged Out   Meningitis B Vaccine  Aged Out   Colon Cancer Screening  Discontinued    Advanced directives: (Copy Requested) Please bring a copy of your health care power of attorney and living will to the office to be added to your chart at your convenience. You can mail to [facility] [address] [place], [place] [address] or email to [email or web address] Advance Care Planning is important because it:  [x]  Makes sure you receive the medical care that is consistent with your values, goals, and preferences  [x]  It provides guidance to your family and loved ones and reduces their decisional  burden about whether or not they are making the right decisions based on your wishes.  Follow the link provided in your after visit summary or read over the paperwork we have mailed to you to help you started getting your Advance Directives in place. If you need assistance in completing these, please reach out to us  so that we can help you!  See attachments for Preventive Care and Fall Prevention Tips.

## 2024-03-18 NOTE — Progress Notes (Addendum)
 Subjective:   Deborah [name] is a 78 y.o. who presents for a Medicare Wellness preventive visit.  As a reminder, Annual Wellness Visits don't include a physical exam, and some assessments may be limited, especially if this visit is performed virtually. We may recommend an in-person follow-up visit with your provider if needed.  Visit Complete: Virtual I connected with  Deborah [name] on 03/18/24 by a audio enabled telemedicine application and verified that I am speaking with the correct person using two identifiers.  Patient Location: Home  Provider Location: Office/Clinic  I discussed the limitations of evaluation and management by telemedicine. The patient expressed understanding and agreed to proceed.  Vital Signs: Because this visit was a virtual/telehealth visit, some criteria may be missing or patient reported. Any vitals not documented were not able to be obtained and vitals that have been documented are patient reported.  VideoDeclined- This patient declined Librarian, academic. Therefore the visit was completed with audio only.  Persons Participating in Visit: Patient.  AWV Questionnaire: Yes: Patient Medicare AWV questionnaire was completed by the patient on 03/17/2024; I have confirmed that all information answered by patient is correct and no changes since this date.  Cardiac Risk Factors include: advanced age (>19men, >36 women);hypertension;dyslipidemia     Objective:     Today's Vitals   03/18/24 1013  Weight: 184 lb (83.5 kg)  Height: 5' 2.5" (1.588 m)   Body mass index is 33.12 kg/m.     03/18/2024   10:21 AM 03/18/2023   10:59 AM 03/12/2022    9:50 AM 03/09/2021    2:12 PM 01/27/2020    1:41 PM 09/07/2017   11:20 AM  Advanced Directives  Does Patient Have a Medical Advance Directive? Yes Yes Yes Yes Yes No  Type of Estate agent of [place];Living will Healthcare Power of  [place];Living will Living will;Healthcare Power of Attorney Living will;Healthcare Power of [employer] Power of [place];Living will   Does patient want to make changes to medical advance directive?   No - Patient declined No - Patient declined No - Patient declined   Copy of Healthcare Power of Attorney in Chart? No - copy requested No - copy requested No - copy requested No - copy requested No - copy requested   Would patient like information on creating a medical advance directive?      No - Patient declined    Current Medications (verified) Outpatient Encounter Medications as of 03/18/2024  Medication Sig   albuterol  (PROVENTIL  HFA;VENTOLIN  HFA) 108 (90 Base) MCG/ACT inhaler Inhale 2 puffs into the lungs every 4 (four) hours as needed for wheezing.   aspirin EC 81 MG tablet Take 81 mg by mouth daily. Swallow whole.   hydrochlorothiazide  (HYDRODIURIL ) 25 MG tablet Take 1 tablet (25 mg total) by mouth daily.   losartan  (COZAAR ) 100 MG tablet Take 1 tablet (100 mg total) by mouth daily.   Multiple Vitamins-Minerals (CENTRUM SILVER ULTRA WOMENS PO) Take 1 tablet by mouth.   Nebivolol  HCl 20 MG TABS Take 1 tablet (20 mg total) by mouth daily.   OVER THE COUNTER MEDICATION daily. Papaya and pineapple.   No facility-administered encounter medications on file as of 03/18/2024.    Allergies (verified) Statins and Propoxyphene n-acetaminophen   History: Past Medical History:  Diagnosis Date   Clotting disorder (HCC)    DVT   DVT (deep venous thrombosis) (HCC)    2-3X   GERD (gastroesophageal reflux disease)  Hyperlipidemia    Hypertension    Ocular migraine    Pneumonia    UTI (urinary tract infection) 10/10/13   E coli 70,000 colonies   Past Surgical History:  Procedure Laterality Date   DVT     2-3 X ; on BCP with first   ENDOVENOUS ABLATION SAPHENOUS VEIN W/ LASER  2003   complicated by DVT    ENDOVENOUS ABLATION SAPHENOUS VEIN W/ LASER  2013   Dr Rosezena Contes   no  colonoscopy     SOC reviewed   TONSILLECTOMY     TUBAL LIGATION     G3 P3   WISDOM TOOTH EXTRACTION     Family History  Problem Relation Age of Onset   Arthritis Mother        OA   Melanoma Mother 29       in situ   Transient ischemic attack Mother        late 62s   COPD Mother    Cancer Father        renal cancer   Diabetes Father    Heart attack Father        in 41s   Hypertension Father    Asthma Father    Hypertension Sister    Asthma Sister    Kidney disease Sister        ? from Celebrex   Kidney failure Sister    Breast cancer Sister 47   Other Sister        Chron's or UC   Stroke Paternal Grandmother        in 40s   Diabetes Paternal Grandmother    Hypertension Paternal Grandmother    Emphysema Paternal Grandfather    Stroke Paternal Grandfather        in 61s   Breast cancer Cousin        early 59's   Diabetes Brother    Colon polyps Neg Hx    Esophageal cancer Neg Hx    Pancreatic cancer Neg Hx    Rectal cancer Neg Hx    Stomach cancer Neg Hx    Social History   Socioeconomic History   Marital status: Married    Spouse name: Lyell Samuel   Number of children: 3   Years of education: Not on file   Highest education level: Not on file  Occupational History   Occupation: retired Charity fundraiser  Tobacco Use   Smoking status: Former    Current packs/day: 0.00    Types: Cigarettes    Quit date: 10/14/1969    Years since quitting: 54.4   Smokeless tobacco: Never   Tobacco comments:    Quit at age 76 (smoked x 2 years, up to 1 ppd)   Vaping Use   Vaping status: Never Used  Substance and Sexual Activity   Alcohol use: Yes    Alcohol/week: 2.0 standard drinks of alcohol    Types: 2 Glasses of wine per week    Comment: Red wine (2 glasses weekly or less)    Drug use: No   Sexual activity: Not on file  Other Topics Concern   Not on file  Social History Narrative   Lives with husband and youngest daughter and her children/2025   Social Drivers of Health    Financial Resource Strain: Low Risk  (03/17/2024)   Overall Financial Resource Strain (CARDIA)    Difficulty of Paying Living Expenses: Not hard at all  Food Insecurity: No Food Insecurity (03/17/2024)   Hunger Vital  Sign    Worried About Programme researcher, broadcasting/film/video in the Last Year: Never true    Ran Out of Food in the Last Year: Never true  Transportation Needs: No Transportation Needs (03/17/2024)   PRAPARE - Administrator, Civil Service (Medical): No    Lack of Transportation (Non-Medical): No  Physical Activity: Sufficiently Active (03/17/2024)   Exercise Vital Sign    Days of Exercise per Week: 6 days    Minutes of Exercise per Session: 40 min  Stress: No Stress Concern Present (03/17/2024)   [employer] of Occupational Health - Occupational Stress Questionnaire    Feeling of Stress : Only a little  Social Connections: Moderately Isolated (03/17/2024)   Social Connection and Isolation Panel [NHANES]    Frequency of Communication with Friends and Family: More than three times a week    Frequency of Social Gatherings with Friends and Family: Once a week    Attends Religious Services: Never    Database administrator or Organizations: No    Attends Engineer, structural: Not on file    Marital Status: Married    Tobacco Counseling Counseling given: Not Answered Tobacco comments: Quit at age 19 (smoked x 2 years, up to 1 ppd)     Clinical Intake:  Pre-visit preparation completed: Yes  Pain : No/denies pain     BMI - recorded: 33.12 Nutritional Status: BMI > 30  Obese Nutritional Risks: None Diabetes: No  Lab Results  Component Value Date   HGBA1C 5.4 06/21/2021   HGBA1C 5.4 05/19/2019   HGBA1C 5.3 06/26/2017     How often do you need to have someone help you when you read instructions, pamphlets, or other written materials from your doctor or pharmacy?: 1 - Never  Interpreter Needed?: No  Information entered by :: Calix Heinbaugh, RMA   Activities  of Daily Living     03/17/2024    7:20 PM  In your present state of health, do you have any difficulty performing the following activities:  Hearing? 0  Vision? 0  Difficulty concentrating or making decisions? 0  Walking or climbing stairs? 0  Dressing or bathing? 0  Doing errands, shopping? 0  Preparing Food and eating ? N  Using the Toilet? N  In the past six months, have you accidently leaked urine? Y  Do you have problems with loss of bowel control? N  Managing your Medications? N  Managing your Finances? N  Housekeeping or managing your Housekeeping? N    Patient Care Team: Colene Dauphin, MD as PCP - General (Internal Medicine) Avanell Leigh, MD as Consulting Physician (Cardiology)  I have updated your Care Teams any recent Medical Services you may have received from other providers in the past year.     Assessment:    This is a routine wellness examination for Deborah [name].  Hearing/Vision screen Hearing Screening - Comments:: Denies hearing difficulties   Vision Screening - Comments:: Wears eyeglasses/Dr. Terrall Ferraris   Goals Addressed             This Visit's Progress    My goal for 2024 is to lose 10 pounds.   Not on track    Still working on it./2025       Depression Screen     03/18/2024   10:22 AM 05/29/2023   10:06 AM 03/18/2023   11:09 AM 03/18/2023   11:00 AM 03/12/2022    9:56 AM 03/09/2021    2:11 PM  01/27/2020    1:43 PM  PHQ 2/9 Scores  PHQ - 2 Score 0 1 0 0 0 0 0  PHQ- 9 Score 0 2 0        Fall Risk     03/17/2024    7:20 PM 05/29/2023   10:06 AM 03/18/2023   11:00 AM 03/15/2023    4:12 PM 03/12/2022    9:51 AM  Fall Risk   Falls in the past year? 0 0 0 0 0  Number falls in past yr:  0 0 0 0  Injury with Fall?  0 0  0  Risk for fall due to :  No Fall Risks No Fall Risks  No Fall Risks  Follow up Falls evaluation completed;Falls prevention discussed Falls evaluation completed Falls prevention discussed  Falls evaluation completed    MEDICARE  RISK AT HOME:  Medicare Risk at Home Any stairs in or around the home?: (Patient-Rptd) Yes If so, are there any without handrails?: (Patient-Rptd) No Home free of loose throw rugs in walkways, pet beds, electrical cords, etc?: (Patient-Rptd) No Adequate lighting in your home to reduce risk of falls?: (Patient-Rptd) Yes Life alert?: (Patient-Rptd) No Use of a cane, walker or w/c?: (Patient-Rptd) No Grab bars in the bathroom?: (Patient-Rptd) Yes Shower chair or bench in shower?: (Patient-Rptd) No Elevated toilet seat or a handicapped toilet?: (Patient-Rptd) No  TIMED UP AND GO:  Was the test performed?  No  Cognitive Function: Declined/Normal: No cognitive concerns noted by patient or family. Patient alert, oriented, able to answer questions appropriately and recall recent events. No signs of memory loss or confusion.        03/18/2023   11:02 AM 03/12/2022   10:06 AM 01/27/2020    1:51 PM  6CIT Screen  What Year? 0 points 0 points 0 points  What month? 0 points 0 points 0 points  What time? 0 points 0 points 0 points  Count back from 20 0 points 0 points 0 points  Months in reverse 0 points 0 points 0 points  Repeat phrase 0 points 0 points 0 points  Total Score 0 points 0 points 0 points    Immunizations Immunization History  Administered Date(s) Administered   Fluad Quad(high Dose 65+) 06/21/2021, 09/21/2022   Influenza Split 07/29/2012   Influenza Whole 07/19/2008   Influenza, High Dose Seasonal PF 06/26/2017, 08/02/2018, 06/26/2019, 06/09/2020   Influenza,inj,Quad PF,6+ Mos 08/10/2013   Influenza-Unspecified 07/31/2014, 08/11/2015, 06/09/2020   Moderna Covid-19 Vaccine Bivalent Booster 28yrs & up 07/09/2021   Moderna SARS-COV2 Booster Vaccination 08/04/2020   Moderna Sars-Covid-2 Vaccination 11/15/2019, 12/24/2019   PFIZER Comirnaty(Gray Top)Covid-19 Tri-Sucrose Vaccine 03/26/2021   Pfizer Covid-19 Vaccine Bivalent Booster 50yrs & up 06/18/2022   Pneumococcal  Conjugate-13 10/20/2015   Pneumococcal Polysaccharide-23 10/25/2013   Respiratory Syncytial Virus Vaccine,Recomb Aduvanted(Arexvy) 11/19/2022   Tdap 04/02/2011, 11/27/2021    Screening Tests Health Maintenance  Topic Date Due   Zoster Vaccines- Shingrix (1 of 2) Never done   COVID-19 Vaccine (7 - 2024-25 season) 06/15/2023   INFLUENZA VACCINE  05/14/2024   DEXA SCAN  10/17/2024   Medicare Annual Wellness (AWV)  03/18/2025   DTaP/Tdap/Td (3 - Td or Tdap) 11/28/2031   Pneumonia Vaccine 56+ Years old  Completed   Hepatitis C Screening  Completed   HPV VACCINES  Aged Out   Meningococcal B Vaccine  Aged Out   Colonoscopy  Discontinued    Health Maintenance  Health Maintenance Due  Topic Date Due  Zoster Vaccines- Shingrix (1 of 2) Never done   COVID-19 Vaccine (7 - 2024-25 season) 06/15/2023   Health Maintenance Items Addressed: See Nurse Notes at the end of this note  Additional Screening:  Vision Screening: Recommended annual ophthalmology exams for early detection of glaucoma and other disorders of the eye. Would you like a referral to an eye doctor? No    Dental Screening: Recommended annual dental exams for proper oral hygiene  Community Resource Referral / Chronic Care Management: CRR required this visit?  No   CCM required this visit?  No   Plan:    I have personally reviewed and noted the following in the patient's chart:   Medical and social history Use of alcohol, tobacco or illicit drugs  Current medications and supplements including opioid prescriptions. Patient is not currently taking opioid prescriptions. Functional ability and status Nutritional status Physical activity Advanced directives List of other physicians Hospitalizations, surgeries, and ER visits in previous 12 months Vitals Screenings to include cognitive, depression, and falls Referrals and appointments  In addition, I have reviewed and discussed with patient certain preventive  protocols, quality metrics, and best practice recommendations. A written personalized care plan for preventive services as well as general preventive health recommendations were provided to patient.   Bryon Parker L Cougar Imel, CMA   03/18/2024   After Visit Summary: (MyChart) Due to this being a telephonic visit, the after visit summary with patients personalized plan was offered to patient via MyChart   Notes: Please refer to Routing Comments.

## 2024-03-28 ENCOUNTER — Encounter: Payer: Self-pay | Admitting: Internal Medicine

## 2024-03-28 DIAGNOSIS — E538 Deficiency of other specified B group vitamins: Secondary | ICD-10-CM | POA: Insufficient documentation

## 2024-03-28 NOTE — Progress Notes (Unsigned)
 Subjective:    Patient ID: Deborah [name], female    DOB: July 06, 1946, 78 y.o.   MRN: [id number]      HPI Deborah [name] is here for a Physical exam and her chronic medical problems.   Needs  OSA test   Medications and allergies reviewed with patient and updated if appropriate.  Current Outpatient Medications on File Prior to Visit  Medication Sig Dispense Refill   albuterol  (PROVENTIL  HFA;VENTOLIN  HFA) 108 (90 Base) MCG/ACT inhaler Inhale 2 puffs into the lungs every 4 (four) hours as needed for wheezing. 1 Inhaler 2   aspirin EC 81 MG tablet Take 81 mg by mouth daily. Swallow whole.     hydrochlorothiazide  (HYDRODIURIL ) 25 MG tablet Take 1 tablet (25 mg total) by mouth daily. 90 tablet 3   losartan  (COZAAR ) 100 MG tablet Take 1 tablet (100 mg total) by mouth daily. 90 tablet 3   Multiple Vitamins-Minerals (CENTRUM SILVER ULTRA WOMENS PO) Take 1 tablet by mouth.     Nebivolol  HCl 20 MG TABS Take 1 tablet (20 mg total) by mouth daily. 90 tablet 3   No current facility-administered medications on file prior to visit.    Review of Systems     Objective:  There were no vitals filed for this visit. There were no vitals filed for this visit. There is no height or weight on file to calculate BMI.  BP Readings from Last 3 Encounters:  10/21/23 (!) 150/78  05/29/23 (!) 146/80  11/27/22 (!) 148/78    Wt Readings from Last 3 Encounters:  03/18/24 184 lb (83.5 kg)  10/21/23 185 lb (83.9 kg)  05/29/23 183 lb 12.8 oz (83.4 kg)       Physical Exam Constitutional: She appears well-developed and well-nourished. No distress.  HENT:  Head: Normocephalic and atraumatic.  Right Ear: External ear normal. Normal ear canal and TM Left Ear: External ear normal.  Normal ear canal and TM Mouth/Throat: Oropharynx is clear and moist.  Eyes: Conjunctivae normal.  Neck: Neck supple. No tracheal deviation present. No thyromegaly present.  No carotid bruit  Cardiovascular:  Normal rate, regular rhythm and normal heart sounds.   No murmur heard.  No edema. Pulmonary/Chest: Effort normal and breath sounds normal. No respiratory distress. She has no wheezes. She has no rales.  Breast: deferred   Abdominal: Soft. She exhibits no distension. There is no tenderness.  Lymphadenopathy: She has no cervical adenopathy.  Skin: Skin is warm and dry. She is not diaphoretic.  Psychiatric: She has a normal mood and affect. Her behavior is normal.     Lab Results  Component Value Date   WBC 6.9 06/21/2021   HGB 15.8 (H) 06/21/2021   HCT 47.1 (H) 06/21/2021   PLT 273.0 06/21/2021   GLUCOSE 100 (H) 11/14/2021   CHOL 189 02/15/2022   TRIG 115.0 02/15/2022   HDL 54.80 02/15/2022   LDLDIRECT 147.1 07/30/2012   LDLCALC 111 (H) 02/15/2022   ALT 16 11/14/2021   AST 17 11/14/2021   NA 139 11/14/2021   K 3.7 11/14/2021   CL 101 11/14/2021   CREATININE 1.03 11/14/2021   BUN 19 11/14/2021   CO2 30 11/14/2021   TSH 2.85 11/14/2021   HGBA1C 5.4 06/21/2021         Assessment & Plan:   Physical exam: Screening blood work  ordered Exercise   Weight   Substance abuse  none   Reviewed recommended immunizations.   Health Maintenance  Topic Date Due  Zoster Vaccines- Shingrix (1 of 2) Never done   COVID-19 Vaccine (7 - 2024-25 season) 06/15/2023   INFLUENZA VACCINE  05/14/2024   DEXA SCAN  10/17/2024   Medicare Annual Wellness (AWV)  03/18/2025   DTaP/Tdap/Td (3 - Td or Tdap) 11/28/2031   Pneumococcal Vaccine: 50+ Years  Completed   Hepatitis C Screening  Completed   HPV VACCINES  Aged Out   Meningococcal B Vaccine  Aged Out   Colonoscopy  Discontinued          See Problem List for Assessment and Plan of chronic medical problems.

## 2024-03-28 NOTE — Patient Instructions (Addendum)
 Blood work was ordered.       Medications changes include :   start spironolactone 12.5 mg daily for your BP     Return in about 1 year (around 03/29/2025) for Physical Exam.    Health Maintenance, Female Adopting a healthy lifestyle and getting preventive care are important in promoting health and wellness. Ask your health care provider about: The right schedule for you to have regular tests and exams. Things you can do on your own to prevent diseases and keep yourself healthy. What should I know about diet, weight, and exercise? Eat a healthy diet  Eat a diet that includes plenty of vegetables, fruits, low-fat dairy products, and lean protein. Do not eat a lot of foods that are high in solid fats, added sugars, or sodium. Maintain a healthy weight Body mass index (BMI) is used to identify weight problems. It estimates body fat based on height and weight. Your health care provider can help determine your BMI and help you achieve or maintain a healthy weight. Get regular exercise Get regular exercise. This is one of the most important things you can do for your health. Most adults should: Exercise for at least 150 minutes each week. The exercise should increase your heart rate and make you sweat (moderate-intensity exercise). Do strengthening exercises at least twice a week. This is in addition to the moderate-intensity exercise. Spend less time sitting. Even light physical activity can be beneficial. Watch cholesterol and blood lipids Have your blood tested for lipids and cholesterol at 77 years of age, then have this test every 5 years. Have your cholesterol levels checked more often if: Your lipid or cholesterol levels are high. You are older than 77 years of age. You are at high risk for heart disease. What should I know about cancer screening? Depending on your health history and family history, you may need to have cancer screening at various ages. This may include  screening for: Breast cancer. Cervical cancer. Colorectal cancer. Skin cancer. Lung cancer. What should I know about heart disease, diabetes, and high blood pressure? Blood pressure and heart disease High blood pressure causes heart disease and increases the risk of stroke. This is more likely to develop in people who have high blood pressure readings or are overweight. Have your blood pressure checked: Every 3-5 years if you are 61-68 years of age. Every year if you are 41 years old or older. Diabetes Have regular diabetes screenings. This checks your fasting blood sugar level. Have the screening done: Once every three years after age 33 if you are at a normal weight and have a low risk for diabetes. More often and at a younger age if you are overweight or have a high risk for diabetes. What should I know about preventing infection? Hepatitis B If you have a higher risk for hepatitis B, you should be screened for this virus. Talk with your health care provider to find out if you are at risk for hepatitis B infection. Hepatitis C Testing is recommended for: Everyone born from [birth year] through 1964. Anyone with known risk factors for hepatitis C. Sexually transmitted infections (STIs) Get screened for STIs, including gonorrhea and chlamydia, if: You are sexually active and are younger than 77 years of age. You are older than 77 years of age and your health care provider tells you that you are at risk for this type of infection. Your sexual activity has changed since you were last screened, and you  are at increased risk for chlamydia or gonorrhea. Ask your health care provider if you are at risk. Ask your health care provider about whether you are at high risk for HIV. Your health care provider may recommend a prescription medicine to help prevent HIV infection. If you choose to take medicine to prevent HIV, you should first get tested for HIV. You should then be tested every 3 months for as  long as you are taking the medicine. Pregnancy If you are about to stop having your period (premenopausal) and you may become pregnant, seek counseling before you get pregnant. Take 400 to 800 micrograms (mcg) of folic acid every day if you become pregnant. Ask for birth control (contraception) if you want to prevent pregnancy. Osteoporosis and menopause Osteoporosis is a disease in which the bones lose minerals and strength with aging. This can result in bone fractures. If you are 82 years old or older, or if you are at risk for osteoporosis and fractures, ask your health care provider if you should: Be screened for bone loss. Take a calcium  or vitamin D supplement to lower your risk of fractures. Be given hormone replacement therapy (HRT) to treat symptoms of menopause. Follow these instructions at home: Alcohol use Do not drink alcohol if: Your health care provider tells you not to drink. You are pregnant, may be pregnant, or are planning to become pregnant. If you drink alcohol: Limit how much you have to: 0-1 drink a day. Know how much alcohol is in your drink. In the [place] one drink equals one 12 oz bottle of beer (355 mL), one 5 oz glass of wine (148 mL), or one 1 oz glass of hard liquor (44 mL). Lifestyle Do not use any products that contain nicotine or tobacco. These products include cigarettes, chewing tobacco, and vaping devices, such as e-cigarettes. If you need help quitting, ask your health care provider. Do not use street drugs. Do not share needles. Ask your health care provider for help if you need support or information about quitting drugs. General instructions Schedule regular health, dental, and eye exams. Stay current with your vaccines. Tell your health care provider if: You often feel depressed. You have ever been abused or do not feel safe at home. Summary Adopting a healthy lifestyle and getting preventive care are important in promoting health and  wellness. Follow your health care provider's instructions about healthy diet, exercising, and getting tested or screened for diseases. Follow your health care provider's instructions on monitoring your cholesterol and blood pressure. This information is not intended to replace advice given to you by your health care provider. Make sure you discuss any questions you have with your health care provider. Document Revised: 02/19/2021 Document Reviewed: 02/19/2021 Elsevier Patient Education  2024 [employer].

## 2024-03-29 ENCOUNTER — Ambulatory Visit (INDEPENDENT_AMBULATORY_CARE_PROVIDER_SITE_OTHER): Admitting: Internal Medicine

## 2024-03-29 VITALS — BP 142/90 | HR 78 | Temp 98.3°F | Ht 62.5 in | Wt 186.0 lb

## 2024-03-29 DIAGNOSIS — I10 Essential (primary) hypertension: Secondary | ICD-10-CM

## 2024-03-29 DIAGNOSIS — E538 Deficiency of other specified B group vitamins: Secondary | ICD-10-CM

## 2024-03-29 DIAGNOSIS — E7849 Other hyperlipidemia: Secondary | ICD-10-CM | POA: Diagnosis not present

## 2024-03-29 DIAGNOSIS — M85852 Other specified disorders of bone density and structure, left thigh: Secondary | ICD-10-CM | POA: Diagnosis not present

## 2024-03-29 DIAGNOSIS — M85851 Other specified disorders of bone density and structure, right thigh: Secondary | ICD-10-CM

## 2024-03-29 DIAGNOSIS — Z0000 Encounter for general adult medical examination without abnormal findings: Secondary | ICD-10-CM | POA: Diagnosis not present

## 2024-03-29 DIAGNOSIS — J4521 Mild intermittent asthma with (acute) exacerbation: Secondary | ICD-10-CM

## 2024-03-29 DIAGNOSIS — R739 Hyperglycemia, unspecified: Secondary | ICD-10-CM

## 2024-03-29 MED ORDER — SPIRONOLACTONE 25 MG PO TABS: 12.5000 mg | ORAL_TABLET | Freq: Every day | ORAL | 3 refills | Status: AC

## 2024-03-29 NOTE — Assessment & Plan Note (Signed)
 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

## 2024-03-29 NOTE — Assessment & Plan Note (Signed)
Chronic Taking multivitamin Check B12 level

## 2024-03-29 NOTE — Assessment & Plan Note (Signed)
Chronic Check a1c Low sugar / carb diet Stressed regular exercise  

## 2024-03-29 NOTE — Assessment & Plan Note (Signed)
Chronic Statin intolerant Will check lipid panel, CMP, TSH She is fairly compliant with a low cholesterol diet

## 2024-03-29 NOTE — Assessment & Plan Note (Signed)
 Chronic DEXA up-to-date She is active and typically walks regularly when she is able Continue multivitamin, vitamin D

## 2024-03-29 NOTE — Assessment & Plan Note (Signed)
 Chronic Blood pressure not ideally controlled CBC, CMP Continue losartan  100 mg daily, hydrochlorothiazide  25 mg daily, Nebivolol  20 mg daily Add spironolactone 12.5 mg daily In the future we can combine the losartan  hydrochlorothiazide  or change to valsartan-HCTZ for better BP control Monitor BP at home BMP in 2 to 4 weeks

## 2024-03-30 LAB — COMPREHENSIVE METABOLIC PANEL WITH GFR
AG Ratio: 1.7 (calc) (ref 1.0–2.5)
ALT: 17 U/L (ref 6–29)
AST: 20 U/L (ref 10–35)
Albumin: 4.6 g/dL (ref 3.6–5.1)
Alkaline phosphatase (APISO): 59 U/L (ref 37–153)
BUN: 22 mg/dL (ref 7–25)
CO2: 26 mmol/L (ref 20–32)
Calcium: 10.4 mg/dL (ref 8.6–10.4)
Chloride: 104 mmol/L (ref 98–110)
Creat: 0.93 mg/dL (ref 0.60–1.00)
Globulin: 2.7 g/dL (ref 1.9–3.7)
Glucose, Bld: 116 mg/dL — ABNORMAL HIGH (ref 65–99)
Potassium: 4.4 mmol/L (ref 3.5–5.3)
Sodium: 141 mmol/L (ref 135–146)
Total Bilirubin: 0.7 mg/dL (ref 0.2–1.2)
Total Protein: 7.3 g/dL (ref 6.1–8.1)
eGFR: 64 mL/min/{1.73_m2} (ref >=60)

## 2024-03-30 LAB — CBC WITH DIFFERENTIAL/PLATELET
Absolute Lymphocytes: 1463 {cells}/uL (ref 850–3900)
Absolute Monocytes: 462 {cells}/uL (ref 200–950)
Basophils Absolute: 49 {cells}/uL (ref 0–200)
Basophils Relative: 0.7 %
Eosinophils Absolute: 168 {cells}/uL (ref 15–500)
Eosinophils Relative: 2.4 %
HCT: 48 % — ABNORMAL HIGH (ref 35.0–45.0)
Hemoglobin: 15.5 g/dL (ref 11.7–15.5)
MCH: 29.1 pg (ref 27.0–33.0)
MCHC: 32.3 g/dL (ref 32.0–36.0)
MCV: 90.2 fL (ref 80.0–100.0)
MPV: 9.9 fL (ref 7.5–12.5)
Monocytes Relative: 6.6 %
Neutro Abs: 4858 {cells}/uL (ref 1500–7800)
Neutrophils Relative %: 69.4 %
Platelets: 291 10*3/uL (ref 140–400)
RBC: 5.32 10*6/uL — ABNORMAL HIGH (ref 3.80–5.10)
RDW: 11.9 % (ref 11.0–15.0)
Total Lymphocyte: 20.9 %
WBC: 7 10*3/uL (ref 3.8–10.8)

## 2024-03-30 LAB — HEMOGLOBIN A1C
Hgb A1c MFr Bld: 5.5 % (ref <5.7)
Mean Plasma Glucose: 111 mg/dL
eAG (mmol/L): 6.2 mmol/L

## 2024-03-30 LAB — LIPID PANEL
Cholesterol: 199 mg/dL (ref <200)
HDL: 68 mg/dL (ref >=50)
LDL Cholesterol (Calc): 109 mg/dL — ABNORMAL HIGH
Non-HDL Cholesterol (Calc): 131 mg/dL — ABNORMAL HIGH (ref <130)
Total CHOL/HDL Ratio: 2.9 (calc) (ref <5.0)
Triglycerides: 116 mg/dL (ref <150)

## 2024-03-30 LAB — TSH: TSH: 1.8 m[IU]/L (ref 0.40–4.50)

## 2024-03-30 LAB — VITAMIN D 25 HYDROXY (VIT D DEFICIENCY, FRACTURES): Vit D, 25-Hydroxy: 48 ng/mL (ref 30–100)

## 2024-03-30 LAB — VITAMIN B12: Vitamin B-12: 418 pg/mL (ref 200–1100)

## 2024-04-03 ENCOUNTER — Ambulatory Visit: Payer: Self-pay | Admitting: Internal Medicine

## 2024-04-30 ENCOUNTER — Encounter: Payer: Self-pay | Admitting: Advanced Practice Midwife

## 2024-06-22 ENCOUNTER — Telehealth: Payer: Self-pay | Admitting: Radiology

## 2024-06-22 NOTE — Telephone Encounter (Signed)
 Copied from CRM [id number]. Topic: General - Other >> Jun 22, 2024  2:23 PM Sasha M wrote: Reason for CRM: pt called in to request covid vax script to [id number] - [place], [place] - [address] [place] [address] Phone: [phone] Fax: [phone] Hours: Not open 24 hours

## 2024-06-23 ENCOUNTER — Other Ambulatory Visit: Payer: Self-pay

## 2024-06-23 MED ORDER — COVID-19 MRNA VAC-TRIS(PFIZER) 30 MCG/0.3ML IM SUSY: 0.3000 mL | PREFILLED_SYRINGE | Freq: Once | INTRAMUSCULAR | 0 refills | Status: AC

## 2024-06-23 NOTE — Telephone Encounter (Signed)
 Sent today

## 2024-08-20 DIAGNOSIS — I872 Venous insufficiency (chronic) (peripheral): Secondary | ICD-10-CM | POA: Diagnosis not present

## 2024-08-20 DIAGNOSIS — I83811 Varicose veins of right lower extremities with pain: Secondary | ICD-10-CM | POA: Diagnosis not present

## 2024-08-20 DIAGNOSIS — M79661 Pain in right lower leg: Secondary | ICD-10-CM | POA: Diagnosis not present

## 2024-09-02 ENCOUNTER — Other Ambulatory Visit: Payer: Self-pay

## 2024-09-02 ENCOUNTER — Emergency Department (HOSPITAL_COMMUNITY)
Admission: EM | Admit: 2024-09-02 | Discharge: 2024-09-03 | Disposition: A | Discharge status: Home / Self Care | Attending: Emergency Medicine | Admitting: Emergency Medicine

## 2024-09-02 ENCOUNTER — Encounter (HOSPITAL_COMMUNITY): Payer: Self-pay

## 2024-09-02 ENCOUNTER — Emergency Department (HOSPITAL_COMMUNITY)

## 2024-09-02 DIAGNOSIS — I10 Essential (primary) hypertension: Secondary | ICD-10-CM | POA: Insufficient documentation

## 2024-09-02 DIAGNOSIS — K8689 Other specified diseases of pancreas: Secondary | ICD-10-CM | POA: Diagnosis not present

## 2024-09-02 DIAGNOSIS — R1084 Generalized abdominal pain: Secondary | ICD-10-CM | POA: Diagnosis not present

## 2024-09-02 DIAGNOSIS — Z7982 Long term (current) use of aspirin: Secondary | ICD-10-CM | POA: Insufficient documentation

## 2024-09-02 DIAGNOSIS — R109 Unspecified abdominal pain: Secondary | ICD-10-CM | POA: Diagnosis not present

## 2024-09-02 DIAGNOSIS — Z7984 Long term (current) use of oral hypoglycemic drugs: Secondary | ICD-10-CM | POA: Insufficient documentation

## 2024-09-02 LAB — URINALYSIS, ROUTINE W REFLEX MICROSCOPIC
Bilirubin Urine: NEGATIVE
Glucose, UA: NEGATIVE mg/dL
Hgb urine dipstick: NEGATIVE
Ketones, ur: NEGATIVE mg/dL
Nitrite: NEGATIVE
Protein, ur: NEGATIVE mg/dL
Specific Gravity, Urine: 1.018 (ref 1.005–1.030)
pH: 5 (ref 5.0–8.0)

## 2024-09-02 LAB — COMPREHENSIVE METABOLIC PANEL WITH GFR
ALT: 18 U/L (ref 0–44)
AST: 23 U/L (ref 15–41)
Albumin: 4.4 g/dL (ref 3.5–5.0)
Alkaline Phosphatase: 66 U/L (ref 38–126)
Anion gap: 11 (ref 5–15)
BUN: 30 mg/dL — ABNORMAL HIGH (ref 8–23)
CO2: 24 mmol/L (ref 22–32)
Calcium: 10.2 mg/dL (ref 8.9–10.3)
Chloride: 104 mmol/L (ref 98–111)
Creatinine, Ser: 1.26 mg/dL — ABNORMAL HIGH (ref 0.44–1.00)
GFR, Estimated: 44 mL/min — ABNORMAL LOW (ref >60)
Glucose, Bld: 119 mg/dL — ABNORMAL HIGH (ref 70–99)
Potassium: 3.5 mmol/L (ref 3.5–5.1)
Sodium: 140 mmol/L (ref 135–145)
Total Bilirubin: 0.5 mg/dL (ref 0.0–1.2)
Total Protein: 7.4 g/dL (ref 6.5–8.1)

## 2024-09-02 LAB — CBC
HCT: 44.7 % (ref 36.0–46.0)
Hemoglobin: 15.2 g/dL — ABNORMAL HIGH (ref 12.0–15.0)
MCH: 30.6 pg (ref 26.0–34.0)
MCHC: 34 g/dL (ref 30.0–36.0)
MCV: 89.9 fL (ref 80.0–100.0)
Platelets: 299 K/uL (ref 150–400)
RBC: 4.97 MIL/uL (ref 3.87–5.11)
RDW: 12.5 % (ref 11.5–15.5)
WBC: 8.5 K/uL (ref 4.0–10.5)
nRBC: 0 % (ref 0.0–0.2)

## 2024-09-02 LAB — TROPONIN T, HIGH SENSITIVITY: Troponin T High Sensitivity: <15 ng/L (ref 0–19)

## 2024-09-02 LAB — LIPASE, BLOOD: Lipase: 91 U/L — ABNORMAL HIGH (ref 11–51)

## 2024-09-02 MED ORDER — OXYCODONE-ACETAMINOPHEN 5-325 MG PO TABS
1.0000 | ORAL_TABLET | ORAL | Status: DC | PRN
  Administered 2024-09-02: 1 via ORAL
  Filled 2024-09-02: qty 1

## 2024-09-02 MED ORDER — SODIUM CHLORIDE 0.9 % IV BOLUS
1000.0000 mL | Freq: Once | INTRAVENOUS | Status: AC
  Administered 2024-09-02: 1000 mL via INTRAVENOUS

## 2024-09-02 MED ORDER — IOHEXOL 300 MG/ML  SOLN
80.0000 mL | Freq: Once | INTRAMUSCULAR | Status: AC | PRN
  Administered 2024-09-02: 80 mL via INTRAVENOUS

## 2024-09-02 NOTE — ED Provider Notes (Signed)
 [place] EMERGENCY DEPARTMENT AT [facility] Provider Note   CSN: [id number] Arrival date & time: 09/02/24  2015     Patient presents with: Abdominal Pain   Deborah [name] is a 78 y.o. female with history of UTI, clotting disorder, DVT, GERD, hypertension, osteopenia.  Presents to ED complaining of generalized abdominal pain.  States that she last ate around noon today.  Reports that around 6:30 PM she developed a generalized and vague abdominal pain.  Reports that this abdominal pain was located all over.  States that abdominal pain was rated 6 out of 10 and described as intermittent sharp/warm.  Nothing over-the-counter relieves pain.  She told triage provider that pain was primarily in the right mid quadrant/right abdominal however patient states that this pain is located all over and generalized to me.  She denies any nausea, vomiting, diarrhea, fevers at home.  Denies any blood in stool.  Denies chest pain or shortness of breath.  Denies any dysuria.  States she does often get UTIs but denies that this feels similarly.  No history of abdominal pain per patient.   Abdominal Pain      Prior to Admission medications   Medication Sig Start Date End Date Taking? Authorizing Provider  aspirin EC 81 MG tablet Take 81 mg by mouth daily. Swallow whole.   Yes [provider]  hydrochlorothiazide  (HYDRODIURIL ) 25 MG tablet Take 1 tablet (25 mg total) by mouth daily. 10/21/23  Yes Court Dorn PARAS, MD  losartan  (COZAAR ) 100 MG tablet Take 1 tablet (100 mg total) by mouth daily. 10/21/23  Yes Court Dorn PARAS, MD  Multiple Vitamins-Minerals (CENTRUM SILVER ULTRA WOMENS PO) Take 1 tablet by mouth.   Yes [provider]  Nebivolol  HCl 20 MG TABS Take 1 tablet (20 mg total) by mouth daily. 10/21/23  Yes Court Dorn PARAS, MD  spironolactone  (ALDACTONE ) 25 MG tablet Take 0.5 tablets (12.5 mg total) by mouth daily. 03/29/24  Yes Burns, Glade PARAS, MD  albuterol   (PROVENTIL  HFA;VENTOLIN  HFA) 108 (90 Base) MCG/ACT inhaler Inhale 2 puffs into the lungs every 4 (four) hours as needed for wheezing. 11/01/16   Marquette Ozell BIRCH, DO    Allergies: Statins    Review of Systems  Gastrointestinal:  Positive for abdominal pain.  All other systems reviewed and are negative.   Updated Vital Signs BP (!) 149/85 (BP Location: Right Arm)   Pulse 72   Temp 97.7 F (36.5 C) (Oral)   Resp 16   Ht 5' 2 (1.575 m)   Wt 84.8 kg   SpO2 99%   BMI 34.20 kg/m   Physical Exam Vitals and nursing note reviewed.  Constitutional:      General: She is not in acute distress.    Appearance: She is well-developed.  HENT:     Head: Normocephalic and atraumatic.  Eyes:     Conjunctiva/sclera: Conjunctivae normal.  Cardiovascular:     Rate and Rhythm: Normal rate and regular rhythm.     Heart sounds: No murmur heard. Pulmonary:     Effort: Pulmonary effort is normal. No respiratory distress.     Breath sounds: Normal breath sounds.  Abdominal:     Palpations: Abdomen is soft.     Tenderness: There is no abdominal tenderness.  Musculoskeletal:        General: No swelling.     Cervical back: Neck supple.  Skin:    General: Skin is warm and dry.     Capillary Refill:  Capillary refill takes less than 2 seconds.  Neurological:     Mental Status: She is alert.  Psychiatric:        Mood and Affect: Mood normal.     (all labs ordered are listed, but only abnormal results are displayed) Labs Reviewed  LIPASE, BLOOD - Abnormal; Notable for the following components:      Result Value   Lipase 91 (*)    All other components within normal limits  COMPREHENSIVE METABOLIC PANEL WITH GFR - Abnormal; Notable for the following components:   Glucose, Bld 119 (*)    BUN 30 (*)    Creatinine, Ser 1.26 (*)    GFR, Estimated 44 (*)    All other components within normal limits  CBC - Abnormal; Notable for the following components:   Hemoglobin 15.2 (*)    All other  components within normal limits  URINALYSIS, ROUTINE W REFLEX MICROSCOPIC - Abnormal; Notable for the following components:   Leukocytes,Ua TRACE (*)    Bacteria, UA RARE (*)    All other components within normal limits  TROPONIN T, HIGH SENSITIVITY  TROPONIN T, HIGH SENSITIVITY    EKG: EKG Interpretation Date/Time:  Thursday September 02 2024 21:12:02 EST Ventricular Rate:  79 PR Interval:  197 QRS Duration:  89 QT Interval:  367 QTC Calculation: 421 R Axis:   -19  Text Interpretation: Sinus rhythm Confirmed by Nettie, April ([id number]) on 09/02/2024 11:50:04 PM  Radiology: CT ABDOMEN PELVIS W CONTRAST Result Date: 09/02/2024 EXAM: CT ABDOMEN AND PELVIS WITH CONTRAST 09/02/2024 11:32:43 PM TECHNIQUE: CT of the abdomen and pelvis was performed with the administration of 80 mL of iohexol  (OMNIPAQUE ) 300 MG/ML solution. Multiplanar reformatted images are provided for review. Automated exposure control, iterative reconstruction, and/or weight-based adjustment of the mA/kV was utilized to reduce the radiation dose to as low as reasonably achievable. COMPARISON: Chest x-ray report 11/21/2015. CLINICAL HISTORY: Abdominal pain, acute, nonlocalized. FINDINGS: LOWER CHEST: No acute abnormality. LIVER: The liver is unremarkable. GALLBLADDER AND BILE DUCTS: Gallbladder is unremarkable. No biliary ductal dilatation. SPLEEN: No acute abnormality. PANCREAS: Interval development of a vague uncinate process heterogeneous hypodense 1.7 cm lesion with associated adjacent calcification (series 2, images 29-31). ADRENAL GLANDS: No acute abnormality. KIDNEYS, URETERS AND BLADDER: No stones in the kidneys or ureters. No hydronephrosis. No perinephric or periureteral stranding. No filling defects of the partially visualized collecting systems on delayed imaging. Urinary bladder is unremarkable. GI AND BOWEL: Stomach demonstrates no acute abnormality. No small or large bowel thickening or dilatation. The appendix is  unremarkable. There is no bowel obstruction. PERITONEUM AND RETROPERITONEUM: No ascites. No free air. VASCULATURE: Aorta is normal in caliber. Mild atherosclerotic plaque. LYMPH NODES: No lymphadenopathy. REPRODUCTIVE ORGANS: The uterus is unremarkable. No adnexal mass. BONES AND SOFT TISSUES: Multilevel degenerative changes of the lumbar spine. No acute osseous abnormality. No focal soft tissue abnormality. IMPRESSION: 1. Interval development of a 1.7 cm heterogeneous hypodense lesion in the pancreatic uncinate process with adjacent calcification, recommend dedicated pancreas-protocol MRI for further characterization and gastroenterology/surgical consultation based on results. Electronically signed by: Morgane Naveau MD 09/02/2024 11:44 PM EST RP Workstation: [id number]    Procedures   Medications Ordered in the ED  oxyCODONE -acetaminophen  (PERCOCET/ROXICET) 5-325 MG per tablet 1 tablet (1 tablet Oral Given 09/02/24 2115)  sodium chloride  0.9 % bolus 1,000 mL (0 mLs Intravenous Stopped 09/03/24 0102)  iohexol  (OMNIPAQUE ) 300 MG/ML solution 80 mL (80 mLs Intravenous Contrast Given 09/02/24 2319)    Medical Decision Making Amount  and/or Complexity of Data Reviewed Labs: ordered.  Risk Prescription drug management.   78 year old female presents to the ED for evaluation.  On exam, the patient is hemodynamically stable.  She is afebrile, nontachycardic.  Her lung sounds are clear bilaterally, no hypoxia.  Abdomen is soft and compressible, no tenderness noted.  Patient labs ordered in triage include CBC, CMP, urinalysis, lipase, troponin x 2.  She was given oxycodone  in triage and reports that after this was given her abdominal pain has decreased.  I have added on CT scan of her abdomen for further assessment.  She is being quite vague regarding her symptoms and she also has an elevated lipase.  Patient CBC without leukocytosis, no anemia.  Her metabolic panel shows glucose 119, BUN 30, creatinine  1.26 with GFR 44.  5 months ago creatinine 0.93.  She denies any nausea, vomiting or diarrhea.  Given 1 L of fluid for this.  Her urinalysis shows trace leukocytes, rare bacteria but the patient denies any dysuria or urinary symptoms.  Denies any lower abdominal pain.  Her troponins are undetectable.  EKG is nonischemic.  Her lipase is elevated to 91 so CT scan of abdomen obtained.  CT scan of abdomen shows interval development of 1.7 cm heterogenous hypodense lesion in the pancreatic wound connate process with adjacent calcification.  They are recommending further characterization with MRI.  Patient reports that she has PCP.  I have advised patient to follow-up with PCP in the morning and advised them of her need for MRI with pancreatic protocol.  She voiced understanding.   In terms of the symptoms the patient was brought in with, she states she has no abdominal pain at this time.  She has had no nausea or vomiting today.  She will be discharged home.  She was advised to follow-up outpatient with her PCP.  She denies any nausea so no indication for providing Zofran this evening.  Patient discharged at this time in stable condition.  Provided return precautions   Final diagnoses:  Generalized abdominal pain    ED Discharge Orders     None          Deborah [name] 09/03/24 0113    Palumbo, April, MD 09/03/24 0155

## 2024-09-02 NOTE — ED Triage Notes (Signed)
 Pt arrived via POV for R mid quad/R abd pain, 7/10 pain intermittent sharp/warm. Nothing OTC relieves pain. Onset 1hr. Pt stated initial pain was yesterday around bedtime. Pt denies f/n/v/d.

## 2024-09-02 NOTE — ED Notes (Signed)
 Patient and family upset about her moving to lobby while waiting for room. RN explained we don't have any room yet and we will call her once we got room ready. Family verbally abusive to staff stating you are no a good caregiver , she is unstable and can't walk mean while the patient states  I'm ok and I can walk. Patient VS stable, blood work was done, medication given and MSE'd by PA.

## 2024-09-02 NOTE — ED Provider Notes (Incomplete)
 [place] EMERGENCY DEPARTMENT AT [facility] Provider Note   CSN: [id number] Arrival date & time: 09/02/24  2015     Patient presents with: Abdominal Pain   Deborah [name] is a 78 y.o. female with history of UTI, clotting disorder, DVT, GERD, hypertension, osteopenia.  Presents to ED complaining of generalized abdominal pain.  States that she last ate around noon today.  Reports that around 6:30 PM she developed a generalized and vague abdominal pain.  Reports that this abdominal pain was located all over.  States that abdominal pain was rated 6 out of 10 and described as intermittent sharp/warm.  Nothing over-the-counter relieves pain.  She told triage provider that pain was primarily in the right mid quadrant/right abdominal however patient states that this pain is located all over and generalized to me.  She denies any nausea, vomiting, diarrhea, fevers at home.  Denies any blood in stool.  Denies chest pain or shortness of breath.  Denies any dysuria.  States she does often get UTIs but denies that this feels similarly.  No history of abdominal pain per patient.   Abdominal Pain      Prior to Admission medications   Medication Sig Start Date End Date Taking? Authorizing Provider  aspirin EC 81 MG tablet Take 81 mg by mouth daily. Swallow whole.   Yes [provider]  hydrochlorothiazide  (HYDRODIURIL ) 25 MG tablet Take 1 tablet (25 mg total) by mouth daily. 10/21/23  Yes Court Dorn PARAS, MD  losartan  (COZAAR ) 100 MG tablet Take 1 tablet (100 mg total) by mouth daily. 10/21/23  Yes Court Dorn PARAS, MD  Multiple Vitamins-Minerals (CENTRUM SILVER ULTRA WOMENS PO) Take 1 tablet by mouth.   Yes [provider]  Nebivolol  HCl 20 MG TABS Take 1 tablet (20 mg total) by mouth daily. 10/21/23  Yes Court Dorn PARAS, MD  spironolactone  (ALDACTONE ) 25 MG tablet Take 0.5 tablets (12.5 mg total) by mouth daily. 03/29/24  Yes Burns, Glade PARAS, MD  albuterol   (PROVENTIL  HFA;VENTOLIN  HFA) 108 (90 Base) MCG/ACT inhaler Inhale 2 puffs into the lungs every 4 (four) hours as needed for wheezing. 11/01/16   Marquette Ozell BIRCH, DO    Allergies: Statins    Review of Systems  Gastrointestinal:  Positive for abdominal pain.    Updated Vital Signs BP (!) 155/96   Pulse 93   Temp 98.4 F (36.9 C) (Oral)   Resp 18   Ht 5' 2 (1.575 m)   Wt 84.8 kg   SpO2 94%   BMI 34.20 kg/m   Physical Exam  (all labs ordered are listed, but only abnormal results are displayed) Labs Reviewed  LIPASE, BLOOD - Abnormal; Notable for the following components:      Result Value   Lipase 91 (*)    All other components within normal limits  COMPREHENSIVE METABOLIC PANEL WITH GFR - Abnormal; Notable for the following components:   Glucose, Bld 119 (*)    BUN 30 (*)    Creatinine, Ser 1.26 (*)    GFR, Estimated 44 (*)    All other components within normal limits  CBC - Abnormal; Notable for the following components:   Hemoglobin 15.2 (*)    All other components within normal limits  URINALYSIS, ROUTINE W REFLEX MICROSCOPIC  TROPONIN T, HIGH SENSITIVITY  TROPONIN T, HIGH SENSITIVITY    EKG: None  Radiology: No results found.  {Document cardiac monitor, telemetry assessment procedure when appropriate:32947} Procedures   Medications Ordered in the  ED  oxyCODONE-acetaminophen (PERCOCET/ROXICET) 5-325 MG per tablet 1 tablet (1 tablet Oral Given 09/02/24 2115)      {Click here for ABCD2, HEART and other calculators REFRESH Note before signing:1}                              Medical Decision Making Amount and/or Complexity of Data Reviewed Labs: ordered.  Risk Prescription drug management.   ***  {Document critical care time when appropriate  Document review of labs and clinical decision tools ie CHADS2VASC2, etc  Document your independent review of radiology images and any outside records  Document your discussion with family members, caretakers  and with consultants  Document social determinants of health affecting pt's care  Document your decision making why or why not admission, treatments were needed:32947:::1}   Final diagnoses:  None    ED Discharge Orders     None

## 2024-09-02 NOTE — ED Provider Triage Note (Addendum)
 Emergency Medicine Provider Triage Evaluation Note  Wandra Babin [facility] , a 78 y.o. female  was evaluated in triage.  Pt complains of 7/10 RUQ abd pain started yesterday. Intermittent. No NVD. Last BM today and normal. +passing gas. No recent travel, recent abx, suspicious food. No CP, SHOB, fevers  Review of Systems  Positive: See hpi Negative: fevers  Physical Exam  BP (!) 155/96   Pulse 93   Temp 98.4 F (36.9 C) (Oral)   Resp 18   Ht 5' 2 (1.575 m)   Wt 84.8 kg   SpO2 94%   BMI 34.20 kg/m  Gen:   Awake, no distress   Resp:  Normal effort  MSK:   Moves extremities without difficulty  Other:  RUQ abd tenderness  Medical Decision Making  Medically screening exam initiated at 9:14 PM.  Appropriate orders placed.  Destynee Stringfellow Jurgensen was informed that the remainder of the evaluation will be completed by another provider, this initial triage assessment does not replace that evaluation, and the importance of remaining in the ED until their evaluation is complete.  Labs ordered. No RUQ US  available. Cardiac workup started     Minnie Tinnie BRAVO, [place] 09/02/24 2119

## 2024-09-03 ENCOUNTER — Telehealth: Payer: Self-pay

## 2024-09-03 DIAGNOSIS — K869 Disease of pancreas, unspecified: Secondary | ICD-10-CM

## 2024-09-03 LAB — TROPONIN T, HIGH SENSITIVITY: Troponin T High Sensitivity: <15 ng/L (ref 0–19)

## 2024-09-03 NOTE — Discharge Instructions (Addendum)
 It was a pleasure taking part in your care.  As we discussed, your CT scan this evening showed a hypodense lesion in the pancreas.  There are multiple causes for this as we discussed.  Please follow-up with PCP for MRI with pancreatic protocol.  Please return to the ED with any new symptoms.

## 2024-09-03 NOTE — Telephone Encounter (Signed)
 Copied from CRM #[id number]. Topic: Referral - Request for Referral >> Sep 03, 2024  9:49 AM Roselie BROCKS wrote: Did the patient discuss referral with their provider in the last year? Yes (If No - schedule appointment) (If Yes - send message)  Appointment offered? No  Type of order/referral and detailed reason for visit: Patient went to [facility] long clinic, with abdominal pain, they requested a MRI Or CT for a lesion on pancrease    Preference of office, provider, location: [facility]   If referral order, have you been seen by this specialty before? No (If Yes, this issue or another issue? When? Where?  Can we respond through MyChart? no >> Sep 03, 2024 10:19 AM Hadassah PARAS wrote: Pt called in to update the test request to an MRI, not CT Scan

## 2024-09-14 ENCOUNTER — Telehealth: Payer: Self-pay

## 2024-09-14 NOTE — Telephone Encounter (Signed)
 Test was ordered on 09/06/24:  Type Date User Summary Attachment  General 09/06/2024  8:12 AM Orlando Ronal POUR  [facility]  will contact pt and schedule. -  Note:  [facility]  will contact pt and schedule.

## 2024-09-14 NOTE — Telephone Encounter (Signed)
 Copied from CRM #[id number]. Topic: General - Other >> Sep 14, 2024  9:30 AM Thersia BROCKS wrote: Reason for CRM: Patient called in stated she needs order for her pancreas, stated that she had a CT , so she needs it for her pancreas.  [phone]

## 2024-09-14 NOTE — Telephone Encounter (Signed)
 Spoke with patient today and phone number provided for her to call and self schedule.

## 2024-09-22 ENCOUNTER — Telehealth: Payer: Self-pay

## 2024-09-22 NOTE — Telephone Encounter (Signed)
 Copied from CRM #[id number]. Topic: Clinical - Medical Advice >> Sep 22, 2024 11:21 AM Rea ORN wrote:  Reason for CRM: Pt will be having an MRI 12/18 and previously denied receiving medication for it because she thought she was driving herself. Now she has a driver and would like rx called in.  [id number] - [place], [place] - [address]   Please call back [phone] once the rx has been sent.

## 2024-09-23 NOTE — Addendum Note (Signed)
 Addended by: GEOFM GLADE PARAS on: 09/23/2024 02:25 PM   Modules accepted: Orders

## 2024-09-23 NOTE — Telephone Encounter (Signed)
 sent

## 2024-09-30 ENCOUNTER — Inpatient Hospital Stay: Admission: RE | Admit: 2024-09-30

## 2024-09-30 DIAGNOSIS — K869 Disease of pancreas, unspecified: Secondary | ICD-10-CM

## 2024-09-30 MED ORDER — GADOPICLENOL 0.5 MMOL/ML IV SOLN
8.0000 mL | Freq: Once | INTRAVENOUS | Status: AC | PRN
  Administered 2024-09-30: 14:00:00 8 mL via INTRAVENOUS

## 2024-10-03 ENCOUNTER — Encounter: Payer: Self-pay | Admitting: Internal Medicine

## 2024-10-03 ENCOUNTER — Ambulatory Visit: Payer: Self-pay | Admitting: Internal Medicine

## 2024-10-03 DIAGNOSIS — K862 Cyst of pancreas: Secondary | ICD-10-CM

## 2024-10-18 ENCOUNTER — Ambulatory Visit: Payer: Self-pay

## 2024-10-18 NOTE — Telephone Encounter (Signed)
" ° °  FYI Only or Action Required?: FYI only for provider: appointment scheduled on 10/19/24.  Patient was last seen in primary care on 03/29/2024 by Geofm Glade PARAS, MD.  Called Nurse Triage reporting Abdominal Pain.  Symptoms began today.  Interventions attempted: Nothing.  Symptoms are: stable.  Triage Disposition: See Physician Within 24 Hours  Patient/caregiver understands and will follow disposition?:    Copied from CRM [id number]. Topic: Clinical - Red Word Triage >> Oct 18, 2024  8:48 AM Deborah [name] wrote: Red Word that prompted transfer to Nurse Triage: Patient stated she has side pain  - Right side - upper quadrant  - Pain began this morning, she has experienced it before.   Looking to schedule an appt with PCP Today/ASAP  **WM Transf. To NT** Reason for Disposition  [1] MODERATE pain (e.g., interferes with normal activities) AND [2] pain comes and goes (cramps) AND [3] present > 24 hours  (Exception: Pain with Vomiting or Diarrhea - see that Guideline.)  Answer Assessment - Initial Assessment Questions 1. LOCATION: Where does it hurt?      Rt upper abd 2. RADIATION: Does the pain shoot anywhere else? (e.g., chest, back)     no 3. ONSET: When did the pain begin? (e.g., minutes, hours or days ago)      ongoing 4. SUDDEN: Gradual or sudden onset?     sudden 5. PATTERN Does the pain come and go, or is it constant?     intermittent 6. SEVERITY: How bad is the pain?  (e.g., Scale 1-10; mild, moderate, or severe)     Mild at this time 7. RECURRENT SYMPTOM: Have you ever had this type of stomach pain before? If Yes, ask: When was the last time? and What happened that time?      Yes, has had MRI 8. CAUSE: What do you think is causing the stomach pain? (e.g., gallstones, recent abdominal surgery)     unknown 9. RELIEVING/AGGRAVATING FACTORS: What makes it better or worse? (e.g., antacids, bending or twisting motion, bowel movement)     unknown 10. OTHER  SYMPTOMS: Do you have any other symptoms? (e.g., back pain, diarrhea, fever, urination pain, vomiting)       no  Protocols used: Abdominal Pain - Female-A-AH  "

## 2024-10-18 NOTE — Progress Notes (Signed)
 "   Subjective:    Patient ID: Deborah [name], female    DOB: 04-13-1946, 79 y.o.   MRN: [id number]      HPI Deborah [name] is here for  Chief Complaint  Patient presents with   Abdominal Pain    Abdomin pain intermittent since November CT showed a lesion and MRI showed a cyst on pancreas.      Discussed the use of AI scribe software for clinical note transcription with the patient, who gave verbal consent to proceed.  History of Present Illness Deborah [name] is a 79 year old female who presents with intermittent right upper abdominal pain. She is accompanied by her daughter, Deborah [name].  She has been experiencing intermittent right upper abdominal pain since November, described as an achy sensation primarily occurring at night around 2 AM. The pain is not related to eating, bowel movements, or physical activity, does not radiate, and is not associated with back pain. She rates the pain as a 4 or 5 out of 10, and it does not prevent her from falling back asleep.  The pain does not occur daily.  She had it last this morning while sitting.    A CT scan was performed after an ER visit due to the same symptoms, revealing a pancreatic cyst but no other abnormalities in the liver, gallbladder, kidneys, or other abdominal organs. The pain has remained consistent since before the CT scan, with no significant changes in intensity or frequency.  No associated symptoms such as nausea, heartburn, diarrhea, constipation, blood in stool, urinary symptoms, or respiratory symptoms. No fevers or chills have been experienced.  Her past medical history includes a previous episode of diarrhea in 2017 for which she saw a gastroenterologist. She has no history of pancreatitis or other significant gastrointestinal issues.    Her and her daughter are also concerned about her memory.  There has been a lot of stress over the past few years.     09/02/24  CT Abdomen/pelvis:    Interval development of a 1.7 cm heterogeneous hypodense lesion in the pancreatic uncinate process with adjacent calcification, recommend dedicated pancreas-protocol MRI for further characterization and gastroenterology/surgical consultation based on results.  09/30/24  MRI Abdomen: 1. 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 No overtly solid mass or suspicious contrast enhancement. No pancreatic ductal dilatation. This may reflect a complex IPMN or alternately a serous cystadenoma in this demographic. Given size and complexity, consider EUS/FNA for definitive diagnosis or alternately continued imaging surveillance in 6-12 months. 2. Hepatic steatosis.   Medications and allergies reviewed with patient and updated if appropriate.  Medications Ordered Prior to Encounter[1]  Review of Systems  Constitutional:  Negative for chills and fever.  Gastrointestinal:  Negative for blood in stool (no melena), constipation and diarrhea.       No gerd  Genitourinary:  Negative for difficulty urinating, dysuria, hematuria and urgency.       Objective:   Vitals:   10/19/24 1559  BP: 112/68  Pulse: (!) 56  Temp: 98.7 F (37.1 C)  SpO2: 95%   BP Readings from Last 3 Encounters:  10/19/24 112/68  09/02/24 (!) 149/85  03/29/24 (!) 142/90   Wt Readings from Last 3 Encounters:  10/19/24 175 lb (79.4 kg)  09/02/24 187 lb (84.8 kg)  03/29/24 186 lb (84.4 kg)   Body mass index is 32.01 kg/m.    Physical Exam Constitutional:      General:  She is not in acute distress.    Appearance: Normal appearance.  HENT:     Head: Normocephalic and atraumatic.  Eyes:     Conjunctiva/sclera: Conjunctivae normal.  Cardiovascular:     Rate and Rhythm: Normal rate and regular rhythm.     Heart sounds: Normal heart sounds.  Pulmonary:     Effort: Pulmonary effort is normal. No respiratory distress.     Breath sounds: Normal breath sounds. No wheezing.   Abdominal:     General: Abdomen is flat. There is no distension.     Palpations: Abdomen is soft. There is no hepatomegaly, splenomegaly or mass.     Tenderness: There is no abdominal tenderness. There is no guarding or rebound. Negative signs include Murphy's sign and McBurney's sign.     Hernia: No hernia is present.  Musculoskeletal:        General: No tenderness (back or ribs).     Cervical back: Neck supple.     Right lower leg: No edema.     Left lower leg: No edema.  Lymphadenopathy:     Cervical: No cervical adenopathy.  Skin:    General: Skin is warm and dry.     Findings: No rash.  Neurological:     Mental Status: She is alert. Mental status is at baseline.  Psychiatric:        Mood and Affect: Mood normal.        Behavior: Behavior normal.          MR Abdomen W Wo Contrast CLINICAL DATA:  Characterize lesion of the pancreatic uncinate identified by prior CT  EXAM: MRI ABDOMEN WITHOUT AND WITH CONTRAST  TECHNIQUE: Multiplanar multisequence MR imaging of the abdomen was performed both before and after the administration of intravenous contrast.  CONTRAST:  8 mL Vueway  gadolinium contrast IV  COMPARISON:  CT abdomen pelvis, 09/02/2024  FINDINGS: Lower chest: No acute abnormality.  Hepatobiliary: No solid liver abnormality is seen. Hepatic steatosis. No gallstones, gallbladder wall thickening, or biliary dilatation.  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 No overtly solid mass or suspicious contrast enhancement. No pancreatic ductal dilatation. No pancreatic ductal dilatation or surrounding inflammatory changes.  Spleen: Normal in size without significant abnormality.  Adrenals/Urinary Tract: Adrenal glands are unremarkable. Kidneys are normal, without renal calculi, solid lesion, or hydronephrosis.  Stomach/Bowel: Stomach is within normal limits. No evidence of  bowel wall thickening, distention, or inflammatory changes.  Vascular/Lymphatic: No significant vascular findings are present. No enlarged abdominal lymph nodes.  Other: No abdominal wall hernia or abnormality. No ascites.  Musculoskeletal: No acute or significant osseous findings.  IMPRESSION: 1. 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 No overtly solid mass or suspicious contrast enhancement. No pancreatic ductal dilatation. This may reflect a complex IPMN or alternately a serous cystadenoma in this demographic. Given size and complexity, consider EUS/FNA for definitive diagnosis or alternately continued imaging surveillance in 6-12 months. 2. Hepatic steatosis.  Electronically Signed   By: Marolyn JONETTA Jaksch M.D.   On: 10/02/2024 10:28    Assessment & Plan:    See Problem List for Assessment and Plan of chronic medical problems.      I personally spent a total of 30 minutes in the care of the patient today including getting/reviewing separately obtained history, performing a medically appropriate exam/evaluation, counseling and educating, placing orders, and documenting clinical information in the  EHR.      [1]  Current Outpatient Medications on File Prior to Visit  Medication Sig Dispense Refill   albuterol  (PROVENTIL  HFA;VENTOLIN  HFA) 108 (90 Base) MCG/ACT inhaler Inhale 2 puffs into the lungs every 4 (four) hours as needed for wheezing. 1 Inhaler 2   aspirin EC 81 MG tablet Take 81 mg by mouth daily. Swallow whole.     hydrochlorothiazide  (HYDRODIURIL ) 25 MG tablet Take 1 tablet (25 mg total) by mouth daily. 90 tablet 3   losartan  (COZAAR ) 100 MG tablet Take 1 tablet (100 mg total) by mouth daily. 90 tablet 3   Multiple Vitamins-Minerals (CENTRUM SILVER ULTRA WOMENS PO) Take 1 tablet by mouth.     Nebivolol  HCl 20 MG TABS Take 1 tablet (20 mg total) by mouth daily. 90 tablet 3   spironolactone  (ALDACTONE ) 25 MG tablet  Take 0.5 tablets (12.5 mg total) by mouth daily. 45 tablet 3   No current facility-administered medications on file prior to visit.   "

## 2024-10-19 ENCOUNTER — Encounter: Payer: Self-pay | Admitting: Internal Medicine

## 2024-10-19 ENCOUNTER — Ambulatory Visit (INDEPENDENT_AMBULATORY_CARE_PROVIDER_SITE_OTHER): Admitting: Internal Medicine

## 2024-10-19 VITALS — BP 112/68 | HR 56 | Temp 98.7°F | Wt 175.0 lb

## 2024-10-19 DIAGNOSIS — R748 Abnormal levels of other serum enzymes: Secondary | ICD-10-CM | POA: Diagnosis not present

## 2024-10-19 DIAGNOSIS — R413 Other amnesia: Secondary | ICD-10-CM | POA: Diagnosis not present

## 2024-10-19 DIAGNOSIS — I10 Essential (primary) hypertension: Secondary | ICD-10-CM

## 2024-10-19 DIAGNOSIS — R1011 Right upper quadrant pain: Secondary | ICD-10-CM | POA: Insufficient documentation

## 2024-10-19 DIAGNOSIS — R350 Frequency of micturition: Secondary | ICD-10-CM | POA: Diagnosis not present

## 2024-10-19 NOTE — Assessment & Plan Note (Signed)
 Recently elevated-checked emergency room when she went in for right upper quadrant pain Recheck lipase

## 2024-10-19 NOTE — Assessment & Plan Note (Signed)
 Mild Likely unrelated to right upper quadrant pain Check UA, urine culture

## 2024-10-19 NOTE — Assessment & Plan Note (Signed)
 Chronic Blood pressure controlled CBC, CMP Continue losartan  100 mg daily, hydrochlorothiazide  25 mg daily, Nebivolol  20 mg daily, spironolactone  12.5 mg daily Monitor BP at home

## 2024-10-19 NOTE — Assessment & Plan Note (Signed)
 Subacute Started to begin intermittent November Was evaluated in the emergency room 09/02/2024 Blood work at that time showed elevated lipase, otherwise unremarkable CT scan showed pancreatic lesion, but was otherwise normal Follow-up MRI of abdomen showed cystic lesion in pancreas, hepatic steatosis The pain has no qualities to help identify cause Check CBC, CMP, UA, urine culture, lipase Referral already ordered for GI-I do not think her right upper quadrant pain is related to her pancreatic cyst I do not think it is related to hepatic steatosis, but can discuss with GI when she sees them She will update me on her pain

## 2024-10-19 NOTE — Assessment & Plan Note (Signed)
 New Both her and her daughter have concerns regarding her memory Her daughter states she noticed this over the past year Over the past few years there has been increased stress, especially in the last year which could be contributing Check TSH, B12 level Referral to neurology

## 2024-10-19 NOTE — Patient Instructions (Addendum)
" ° °  [facility] [place]  Phone: [phone]    Blood work was ordered.   Have this done when you can .      Medications changes include :   None   A referral was ordered for [facility].     Return if symptoms worsen or fail to improve.  "

## 2024-10-20 ENCOUNTER — Encounter: Payer: Self-pay | Admitting: Cardiovascular Disease

## 2024-10-20 ENCOUNTER — Other Ambulatory Visit: Payer: Self-pay | Admitting: Cardiovascular Disease

## 2024-10-20 LAB — CBC WITH DIFFERENTIAL/PLATELET
Basophils Absolute: 0 K/uL (ref 0.0–0.1)
Basophils Relative: 0.6 % (ref 0.0–3.0)
Eosinophils Absolute: 0.3 K/uL (ref 0.0–0.7)
Eosinophils Relative: 4.3 % (ref 0.0–5.0)
HCT: 43.6 % (ref 36.0–46.0)
Hemoglobin: 14.5 g/dL (ref 12.0–15.0)
Lymphocytes Relative: 26 % (ref 12.0–46.0)
Lymphs Abs: 1.7 K/uL (ref 0.7–4.0)
MCHC: 33.3 g/dL (ref 30.0–36.0)
MCV: 89.6 fl (ref 78.0–100.0)
Monocytes Absolute: 0.5 K/uL (ref 0.1–1.0)
Monocytes Relative: 7.9 % (ref 3.0–12.0)
Neutro Abs: 4 K/uL (ref 1.4–7.7)
Neutrophils Relative %: 61.2 % (ref 43.0–77.0)
Platelets: 280 K/uL (ref 150.0–400.0)
RBC: 4.86 Mil/uL (ref 3.87–5.11)
RDW: 13.1 % (ref 11.5–15.5)
WBC: 6.6 K/uL (ref 4.0–10.5)

## 2024-10-20 LAB — COMPREHENSIVE METABOLIC PANEL WITH GFR
ALT: 15 U/L (ref 3–35)
AST: 19 U/L (ref 5–37)
Albumin: 4.6 g/dL (ref 3.5–5.2)
Alkaline Phosphatase: 49 U/L (ref 39–117)
BUN: 25 mg/dL — ABNORMAL HIGH (ref 6–23)
CO2: 28 meq/L (ref 19–32)
Calcium: 9.9 mg/dL (ref 8.4–10.5)
Chloride: 102 meq/L (ref 96–112)
Creatinine, Ser: 1.13 mg/dL (ref 0.40–1.20)
GFR: 46.93 mL/min — ABNORMAL LOW
Glucose, Bld: 105 mg/dL — ABNORMAL HIGH (ref 70–99)
Potassium: 3.9 meq/L (ref 3.5–5.1)
Sodium: 139 meq/L (ref 135–145)
Total Bilirubin: 0.7 mg/dL (ref 0.2–1.2)
Total Protein: 7.4 g/dL (ref 6.0–8.3)

## 2024-10-20 LAB — URINALYSIS, ROUTINE W REFLEX MICROSCOPIC
Bilirubin Urine: NEGATIVE
Hgb urine dipstick: NEGATIVE
Ketones, ur: NEGATIVE
Leukocytes,Ua: NEGATIVE
Nitrite: NEGATIVE
RBC / HPF: NONE SEEN
Specific Gravity, Urine: 1.015 (ref 1.000–1.030)
Total Protein, Urine: NEGATIVE
Urine Glucose: NEGATIVE
Urobilinogen, UA: 0.2 (ref 0.0–1.0)
WBC, UA: NONE SEEN
pH: 6 (ref 5.0–8.0)

## 2024-10-20 LAB — LIPASE: Lipase: 32 U/L (ref 11.0–59.0)

## 2024-10-20 LAB — VITAMIN B12: Vitamin B-12: 231 pg/mL (ref 211–911)

## 2024-10-20 LAB — TSH: TSH: 2.03 u[IU]/mL (ref 0.35–5.50)

## 2024-10-21 ENCOUNTER — Other Ambulatory Visit: Payer: Self-pay | Admitting: Cardiovascular Disease

## 2024-10-21 ENCOUNTER — Ambulatory Visit: Payer: Self-pay | Admitting: Internal Medicine

## 2024-10-21 LAB — URINE CULTURE

## 2024-10-22 ENCOUNTER — Encounter: Payer: Self-pay | Admitting: Physician Assistant

## 2024-10-22 ENCOUNTER — Telehealth: Payer: Self-pay | Admitting: Gastroenterology

## 2024-10-22 NOTE — Telephone Encounter (Signed)
 Patient has been scheduled with Delon on 2/4 at 2:10

## 2024-10-22 NOTE — Telephone Encounter (Signed)
 Good afternoon Dr. Nandigam,   We have received referral for patient to be seen for Pancreatic cyst. Patients daughter called to schedule an appointment. I advised her the referral would need to be reviewed.   Will you please review and advise on scheduling patient?  Thank you.

## 2024-10-22 NOTE — Telephone Encounter (Signed)
 Please schedule next available appointment with APP, will need to consider EUS for follow and further evaluation

## 2024-10-27 NOTE — Progress Notes (Signed)
 "   Mild cognitive impairment of unclear etiology   Deborah [name] is a very pleasant 79 y.o. year old RH female with a history of hypertension, hyperlipidemia, history of DVT, GERD, hypertension, dyslexia, seen today for evaluation of memory loss. MoCA today is 21/30. Etiology is unclear, workup is ongoing. Patient is able to participate on ADLs and to drive without difficulties. Mood is good. Patient is accompanied by her husband who supplement the history.  Follow up in after neuropsych evaluation, pending on the diagnosis will initiate antidementia medication MRI brain to further evaluate for structural abnormalities and vascular load  Neuropsychological evaluation for further investigate other causes of memory loss including sleep, attention, anxiety, depression among others   Discussed the use of AI scribe software for clinical note transcription with the patient, who gave verbal consent to proceed.  History of Present Illness Deborah [name] is a 79 year old female who presents with memory changes. She is accompanied by her husband.    For the past six months, she has experienced changes in her memory, particularly affecting short-term.  She has difficulty remembering new information, recent conversations, and ideas .  Her husband notes frequent forgetfulness regarding plans shortly after making them. She uses a calendar to keep track of appointments and important dates.  She does not experience disorientation at home or in her neighborhood and drives without difficulty. Occasionally, she forgets where she places items like her cell phone, but they are usually found in typical locations such as the kitchen counter or a different purse. No significant personality changes are noted, although her husband observes increased forgetfulness towards the end of the day. No agitation, hallucinations, paranoia, or seizures are present. She sometimes becomes  irritable when reminded of forgotten things.  She sleeps well without issues and does not have a history of sleep apnea. She manages her medications independently using a seven-day pill organizer. She has a good appetite and no issues with choking. She is a capable cook and does not forget her recipes, although she may refer to them for more complex dishes.  She walks daily, often with her dogs, and has not experienced any recent falls or head injuries. Her last vision check was over a year ago, and she wears bifocals. She has no history of cataracts or glaucoma.  There is a family history of dementia, unknown type.  She lives with her husband, daughter, and two grandchildren. 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 She does not consume alcohol heavily and has a history of smoking during nursing school but has not smoked in a long time.  She drives without difficulties  She reports stress incontinence when sneezing.      Allergies[1]  Current Outpatient Medications  Medication Instructions   albuterol  (PROVENTIL  HFA;VENTOLIN  HFA) 108 (90 Base) MCG/ACT inhaler 2 puffs, Inhalation, Every 4 hours PRN   aspirin EC 81 mg, Daily   hydrochlorothiazide  (HYDRODIURIL ) 25 mg, Oral, Daily   losartan  (COZAAR ) 100 mg, Oral, Daily   Multiple Vitamins-Minerals (CENTRUM SILVER ULTRA WOMENS PO) 1 tablet   Nebivolol  HCl 20 mg, Oral, Daily   spironolactone  (ALDACTONE ) 12.5 mg, Oral, Daily    VITALS:   Vitals:   10/28/24 1327  BP: (!) 138/98  Pulse: 65  Resp: 20  SpO2: 98%  Weight: 175 lb (79.4 kg)  Height: 5' 2.5 (1.588 m)     Neurological Exam     10/28/2024    5:00  PM  Montreal Cognitive Assessment   Visuospatial/ Executive (0/5) 4  Naming (0/3) 3  Attention: Read list of digits (0/2) 1  Attention: Read list of letters (0/1) 1  Attention: Serial 7 subtraction starting at 100 (0/3) 3  Language: Repeat phrase (0/2) 1  Language : Fluency (0/1) 0  Abstraction  (0/2) 2  Delayed Recall (0/5) 0  Orientation (0/6) 6  Total 21  Adjusted Score (based on education) 21        No data to display            Orientation:  Alert and oriented to person, place and to time. No aphasia or dysarthria. Fund of knowledge is appropriate. Recent and remote memory impaired.  Attention and concentration are reduced.  Able to name objects and repeat phrases 1/2.  Delayed recall 0/5. Cranial nerves: There is good facial symmetry. Extraocular muscles are intact and visual fields are full to confrontational testing. Speech is fluent and clear. No tongue deviation. Hearing is intact to conversational tone. Tone: Tone is good throughout. Sensation: Sensation is intact to light touch.  Vibration is intact at the bilateral big toe.  Coordination: The patient has no difficulty with RAM's or FNF bilaterally. Normal finger to nose  Motor: Strength is 5/5 in the bilateral upper and lower extremities. There is no pronator drift. There are no fasciculations noted. DTR's: Deep tendon reflexes are 2/4 bilaterally. Gait and Station: The patient is able to ambulate without difficulty. Gait is cautious and narrow. Stride length is normal.     Thank you for allowing us  the opportunity to participate in the care of this nice patient. Please do not hesitate to contact us  for any questions or concerns.   Total time spent on today's visit was 53 minutes dedicated to this patient today, preparing to see patient, examining the patient, ordering tests and/or medications and counseling the patient, documenting clinical information in the EHR or other health record, independently interpreting results and communicating results to the patient/family, discussing treatment and goals, answering patient's questions and coordinating care.  Cc:  Geofm Glade PARAS, MD  Camie Sevin 10/28/2024 6:01 PM       [1]  Allergies Allergen Reactions   Statins     Muscle aches with some, elevated lft with  crestor    "

## 2024-10-28 ENCOUNTER — Encounter: Payer: Self-pay | Admitting: Physician Assistant

## 2024-10-28 ENCOUNTER — Ambulatory Visit

## 2024-10-28 ENCOUNTER — Ambulatory Visit: Admitting: Physician Assistant

## 2024-10-28 VITALS — BP 138/98 | HR 65 | Resp 20 | Ht 62.5 in | Wt 175.0 lb

## 2024-10-28 DIAGNOSIS — R413 Other amnesia: Secondary | ICD-10-CM

## 2024-10-28 NOTE — Patient Instructions (Signed)
 MRI brain  Neuropsych evaluation  Return after the neuropsych evaluation

## 2024-11-17 ENCOUNTER — Other Ambulatory Visit

## 2024-11-17 ENCOUNTER — Encounter: Payer: Self-pay | Admitting: Physician Assistant

## 2024-11-17 ENCOUNTER — Ambulatory Visit: Admitting: Physician Assistant

## 2024-11-17 VITALS — BP 122/64 | HR 64 | Ht 63.0 in | Wt 175.0 lb

## 2024-11-17 DIAGNOSIS — R9389 Abnormal findings on diagnostic imaging of other specified body structures: Secondary | ICD-10-CM | POA: Diagnosis not present

## 2024-11-17 DIAGNOSIS — K862 Cyst of pancreas: Secondary | ICD-10-CM | POA: Diagnosis not present

## 2024-11-17 DIAGNOSIS — R935 Abnormal findings on diagnostic imaging of other abdominal regions, including retroperitoneum: Secondary | ICD-10-CM

## 2024-11-17 NOTE — Telephone Encounter (Signed)
 Multiple attempts to contact patient and They will call [facility] to schedule Mri wo contrast. FYI

## 2024-11-17 NOTE — Progress Notes (Signed)
 I would favor monitoring this with MRI/MRCP right now based on the size.  However if CA 19-9 is elevated, then EUS can then be coordinated. Would recommend MRI/MRCP in 4-6 months to establish stability otherwise.  If continued progression in size at that interval, then EUS is reasonable to pursue at that point.

## 2024-11-17 NOTE — Patient Instructions (Signed)
 Your provider has requested that you go to the basement level for lab work before leaving today. Press B on the elevator. The lab is located at the first door on the left as you exit the elevator.  _______________________________________________________  If your blood pressure at your visit was 140/90 or greater, please contact your primary care physician to follow up on this.  _______________________________________________________  If you are age 78 or older, your body mass index should be between 23-30. Your Body mass index is 31 kg/m. If this is out of the aforementioned range listed, please consider follow up with your Primary Care Provider.  If you are age 62 or younger, your body mass index should be between 19-25. Your Body mass index is 31 kg/m. If this is out of the aformentioned range listed, please consider follow up with your Primary Care Provider.   ________________________________________________________  The [facility] providers would like to encourage you to use MYCHART to communicate with providers for non-urgent requests or questions.  Due to long hold times on the telephone, sending your provider a message by [facility] may be a faster and more efficient way to get a response.  Please allow 48 business hours for a response.  Please remember that this is for non-urgent requests.  _______________________________________________________  [facility] is using a team-based approach to care.  Your team is made up of your doctor and two to three APPS. Our APPS (Nurse Practitioners and Physician Assistants) work with your physician to ensure care continuity for you. They are fully qualified to address your health concerns and develop a treatment plan. They communicate directly with your gastroenterologist to care for you. Seeing the Advanced Practice Practitioners on your physician's team can help you by facilitating care more promptly, often allowing for earlier  appointments, access to diagnostic testing, procedures, and other specialty referrals.

## 2024-11-18 ENCOUNTER — Ambulatory Visit: Payer: Self-pay

## 2024-11-18 ENCOUNTER — Encounter: Payer: Self-pay | Admitting: Psychology

## 2024-11-18 ENCOUNTER — Institutional Professional Consult (permissible substitution): Payer: Self-pay | Admitting: Psychology

## 2024-11-18 ENCOUNTER — Telehealth: Payer: Self-pay

## 2024-11-18 LAB — CANCER ANTIGEN 19-9: CA 19-9: 13 U/mL

## 2024-11-18 NOTE — Telephone Encounter (Signed)
 Editor: Mansouraty, Aloha Raddle., MD (Physician)              I would favor monitoring this with MRI/MRCP right now based on the size.  However if CA 19-9 is elevated, then EUS can then be coordinated. Would recommend MRI/MRCP in 4-6 months to establish stability otherwise.  If continued progression in size at that interval, then EUS is reasonable to pursue at that point.

## 2024-11-18 NOTE — Telephone Encounter (Signed)
-----   Message from Delon Failing, [place] sent at 11/18/2024 11:30 AM EST ----- Regarding: please call Please let patient know Dr. Melba recommendations.  Please put her in recall for an MRI MRCP in 4 to 6 months.  If her CA 19-9 shows up elevated then I will let her know.  Thanks, JL L ----- Message ----- From: [place] Aloha Raddle., MD Sent: 11/17/2024   4:15 PM EST To: Kavitha Nandigam V, MD; Jennifer Lynne Lemmo#

## 2024-11-18 NOTE — Telephone Encounter (Signed)
 The pt has been advised and will be called in 4-6 months for MRI MRCP

## 2024-11-19 ENCOUNTER — Ambulatory Visit: Payer: Self-pay | Admitting: *Deleted

## 2024-11-25 ENCOUNTER — Encounter: Payer: Self-pay | Admitting: Psychology

## 2024-12-03 ENCOUNTER — Ambulatory Visit: Payer: Self-pay

## 2024-12-03 ENCOUNTER — Ambulatory Visit: Payer: Self-pay | Admitting: Physician Assistant

## 2024-12-05 ENCOUNTER — Other Ambulatory Visit

## 2024-12-06 ENCOUNTER — Ambulatory Visit: Payer: Self-pay | Admitting: Physician Assistant

## 2024-12-13 ENCOUNTER — Institutional Professional Consult (permissible substitution): Payer: Self-pay | Admitting: Psychology

## 2024-12-13 ENCOUNTER — Ambulatory Visit: Payer: Self-pay

## 2024-12-20 ENCOUNTER — Encounter: Admitting: Psychology

## 2024-12-30 ENCOUNTER — Ambulatory Visit: Admitting: Physician Assistant

## 2025-03-23 ENCOUNTER — Ambulatory Visit
